# Patient Record
Sex: Female | Born: 1940 | Race: White | Hispanic: No | Marital: Married | Smoking: Never smoker
Health system: Southern US, Community
[De-identification: ages and names within clinical notes are randomized; demographics above are authoritative.]

## PROBLEM LIST (undated history)

## (undated) DIAGNOSIS — N183 Chronic kidney disease, stage 3 unspecified: Secondary | ICD-10-CM

## (undated) DIAGNOSIS — E785 Hyperlipidemia, unspecified: Secondary | ICD-10-CM

## (undated) DIAGNOSIS — I5042 Chronic combined systolic (congestive) and diastolic (congestive) heart failure: Secondary | ICD-10-CM

## (undated) DIAGNOSIS — I639 Cerebral infarction, unspecified: Secondary | ICD-10-CM

## (undated) DIAGNOSIS — E039 Hypothyroidism, unspecified: Secondary | ICD-10-CM

## (undated) DIAGNOSIS — I10 Essential (primary) hypertension: Secondary | ICD-10-CM

## (undated) HISTORY — PX: BACK SURGERY: SHX140

## (undated) HISTORY — PX: ABDOMINAL HYSTERECTOMY: SHX81

## (undated) HISTORY — DX: Chronic combined systolic (congestive) and diastolic (congestive) heart failure: I50.42

## (undated) HISTORY — DX: Chronic kidney disease, stage 3 unspecified: N18.30

## (undated) HISTORY — DX: Hypothyroidism, unspecified: E03.9

## (undated) HISTORY — DX: Cerebral infarction, unspecified: I63.9

---

## 2001-06-03 ENCOUNTER — Ambulatory Visit (HOSPITAL_BASED_OUTPATIENT_CLINIC_OR_DEPARTMENT_OTHER): Admission: RE | Admit: 2001-06-03 | Discharge: 2001-06-03 | Payer: Self-pay | Admitting: Internal Medicine

## 2022-03-02 ENCOUNTER — Inpatient Hospital Stay (HOSPITAL_COMMUNITY)
Admission: EM | Admit: 2022-03-02 | Discharge: 2022-03-07 | DRG: 065 | Disposition: A | Discharge status: Rehabilitation Facility | Payer: Medicare Other | Attending: Family Medicine | Admitting: Family Medicine

## 2022-03-02 ENCOUNTER — Other Ambulatory Visit: Payer: Self-pay

## 2022-03-02 ENCOUNTER — Emergency Department (HOSPITAL_COMMUNITY): Payer: Medicare Other

## 2022-03-02 DIAGNOSIS — I10 Essential (primary) hypertension: Secondary | ICD-10-CM | POA: Diagnosis present

## 2022-03-02 DIAGNOSIS — R339 Retention of urine, unspecified: Secondary | ICD-10-CM | POA: Diagnosis present

## 2022-03-02 DIAGNOSIS — R4701 Aphasia: Secondary | ICD-10-CM | POA: Diagnosis present

## 2022-03-02 DIAGNOSIS — I5022 Chronic systolic (congestive) heart failure: Secondary | ICD-10-CM | POA: Diagnosis present

## 2022-03-02 DIAGNOSIS — R682 Dry mouth, unspecified: Secondary | ICD-10-CM | POA: Diagnosis present

## 2022-03-02 DIAGNOSIS — I6340 Cerebral infarction due to embolism of unspecified cerebral artery: Secondary | ICD-10-CM | POA: Diagnosis not present

## 2022-03-02 DIAGNOSIS — Z20822 Contact with and (suspected) exposure to covid-19: Secondary | ICD-10-CM | POA: Diagnosis present

## 2022-03-02 DIAGNOSIS — T424X5A Adverse effect of benzodiazepines, initial encounter: Secondary | ICD-10-CM | POA: Diagnosis present

## 2022-03-02 DIAGNOSIS — R338 Other retention of urine: Secondary | ICD-10-CM | POA: Diagnosis present

## 2022-03-02 DIAGNOSIS — R471 Dysarthria and anarthria: Secondary | ICD-10-CM | POA: Diagnosis present

## 2022-03-02 DIAGNOSIS — R531 Weakness: Secondary | ICD-10-CM

## 2022-03-02 DIAGNOSIS — I5042 Chronic combined systolic (congestive) and diastolic (congestive) heart failure: Secondary | ICD-10-CM | POA: Diagnosis present

## 2022-03-02 DIAGNOSIS — Z79899 Other long term (current) drug therapy: Secondary | ICD-10-CM

## 2022-03-02 DIAGNOSIS — E785 Hyperlipidemia, unspecified: Secondary | ICD-10-CM | POA: Diagnosis present

## 2022-03-02 DIAGNOSIS — R413 Other amnesia: Secondary | ICD-10-CM | POA: Diagnosis present

## 2022-03-02 DIAGNOSIS — F411 Generalized anxiety disorder: Secondary | ICD-10-CM | POA: Diagnosis present

## 2022-03-02 DIAGNOSIS — I130 Hypertensive heart and chronic kidney disease with heart failure and stage 1 through stage 4 chronic kidney disease, or unspecified chronic kidney disease: Secondary | ICD-10-CM | POA: Diagnosis present

## 2022-03-02 DIAGNOSIS — N1832 Chronic kidney disease, stage 3b: Secondary | ICD-10-CM | POA: Diagnosis present

## 2022-03-02 DIAGNOSIS — R29810 Facial weakness: Secondary | ICD-10-CM | POA: Diagnosis present

## 2022-03-02 DIAGNOSIS — I4891 Unspecified atrial fibrillation: Secondary | ICD-10-CM | POA: Diagnosis present

## 2022-03-02 DIAGNOSIS — I69351 Hemiplegia and hemiparesis following cerebral infarction affecting right dominant side: Secondary | ICD-10-CM

## 2022-03-02 DIAGNOSIS — Z823 Family history of stroke: Secondary | ICD-10-CM

## 2022-03-02 DIAGNOSIS — E039 Hypothyroidism, unspecified: Secondary | ICD-10-CM | POA: Diagnosis present

## 2022-03-02 DIAGNOSIS — R29701 NIHSS score 1: Secondary | ICD-10-CM | POA: Diagnosis present

## 2022-03-02 DIAGNOSIS — F32A Depression, unspecified: Secondary | ICD-10-CM | POA: Diagnosis present

## 2022-03-02 DIAGNOSIS — I639 Cerebral infarction, unspecified: Secondary | ICD-10-CM | POA: Diagnosis present

## 2022-03-02 DIAGNOSIS — Z91041 Radiographic dye allergy status: Secondary | ICD-10-CM

## 2022-03-02 DIAGNOSIS — N1831 Chronic kidney disease, stage 3a: Secondary | ICD-10-CM | POA: Diagnosis present

## 2022-03-02 DIAGNOSIS — Z8249 Family history of ischemic heart disease and other diseases of the circulatory system: Secondary | ICD-10-CM

## 2022-03-02 HISTORY — DX: Essential (primary) hypertension: I10

## 2022-03-02 HISTORY — DX: Hyperlipidemia, unspecified: E78.5

## 2022-03-02 LAB — DIFFERENTIAL
Abs Immature Granulocytes: 0.01 10*3/uL (ref 0.00–0.07)
Basophils Absolute: 0.1 10*3/uL (ref 0.0–0.1)
Basophils Relative: 1 %
Eosinophils Absolute: 0.3 10*3/uL (ref 0.0–0.5)
Eosinophils Relative: 4 %
Immature Granulocytes: 0 %
Lymphocytes Relative: 31 %
Lymphs Abs: 2 10*3/uL (ref 0.7–4.0)
Monocytes Absolute: 0.7 10*3/uL (ref 0.1–1.0)
Monocytes Relative: 11 %
Neutro Abs: 3.4 10*3/uL (ref 1.7–7.7)
Neutrophils Relative %: 53 %

## 2022-03-02 LAB — COMPREHENSIVE METABOLIC PANEL
ALT: 17 U/L (ref 0–44)
AST: 30 U/L (ref 15–41)
Albumin: 4.4 g/dL (ref 3.5–5.0)
Alkaline Phosphatase: 74 U/L (ref 38–126)
Anion gap: 12 (ref 5–15)
BUN: 28 mg/dL — ABNORMAL HIGH (ref 8–23)
CO2: 26 mmol/L (ref 22–32)
Calcium: 9.7 mg/dL (ref 8.9–10.3)
Chloride: 98 mmol/L (ref 98–111)
Creatinine, Ser: 1.14 mg/dL — ABNORMAL HIGH (ref 0.44–1.00)
GFR, Estimated: 48 mL/min — ABNORMAL LOW (ref >60)
Glucose, Bld: 123 mg/dL — ABNORMAL HIGH (ref 70–99)
Potassium: 3.7 mmol/L (ref 3.5–5.1)
Sodium: 136 mmol/L (ref 135–145)
Total Bilirubin: 0.4 mg/dL (ref 0.3–1.2)
Total Protein: 7.2 g/dL (ref 6.5–8.1)

## 2022-03-02 LAB — PROTIME-INR
INR: 1.1 (ref 0.8–1.2)
Prothrombin Time: 13.6 seconds (ref 11.4–15.2)

## 2022-03-02 LAB — I-STAT CHEM 8, ED
BUN: 29 mg/dL — ABNORMAL HIGH (ref 8–23)
Calcium, Ion: 1.08 mmol/L — ABNORMAL LOW (ref 1.15–1.40)
Chloride: 98 mmol/L (ref 98–111)
Creatinine, Ser: 1.2 mg/dL — ABNORMAL HIGH (ref 0.44–1.00)
Glucose, Bld: 118 mg/dL — ABNORMAL HIGH (ref 70–99)
HCT: 37 % (ref 36.0–46.0)
Hemoglobin: 12.6 g/dL (ref 12.0–15.0)
Potassium: 3.7 mmol/L (ref 3.5–5.1)
Sodium: 135 mmol/L (ref 135–145)
TCO2: 26 mmol/L (ref 22–32)

## 2022-03-02 LAB — CBC
HCT: 35.8 % — ABNORMAL LOW (ref 36.0–46.0)
Hemoglobin: 12.2 g/dL (ref 12.0–15.0)
MCH: 30.7 pg (ref 26.0–34.0)
MCHC: 34.1 g/dL (ref 30.0–36.0)
MCV: 89.9 fL (ref 80.0–100.0)
Platelets: 290 10*3/uL (ref 150–400)
RBC: 3.98 MIL/uL (ref 3.87–5.11)
RDW: 12.8 % (ref 11.5–15.5)
WBC: 6.5 10*3/uL (ref 4.0–10.5)
nRBC: 0 % (ref 0.0–0.2)

## 2022-03-02 LAB — ETHANOL: Alcohol, Ethyl (B): <10 mg/dL (ref <10)

## 2022-03-02 LAB — APTT: aPTT: 30 seconds (ref 24–36)

## 2022-03-02 IMAGING — CT CT HEAD CODE STROKE
3 series · 15 of 47 positions shown, 18 images · non-contrast
Comparison: None Available.

CLINICAL DATA: Code stroke. Initial evaluation for neuro deficit,
stroke suspected.



[Series 3: head 5.0 h30s · axial · 0.42mm/px · z∈[-53,+82]mm · 9 of 33 slices shown, 12 images]
[im 3/33  brain]
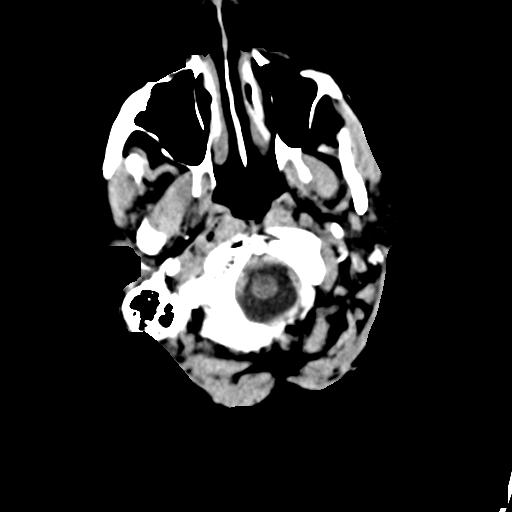
[im 3/33  bone]
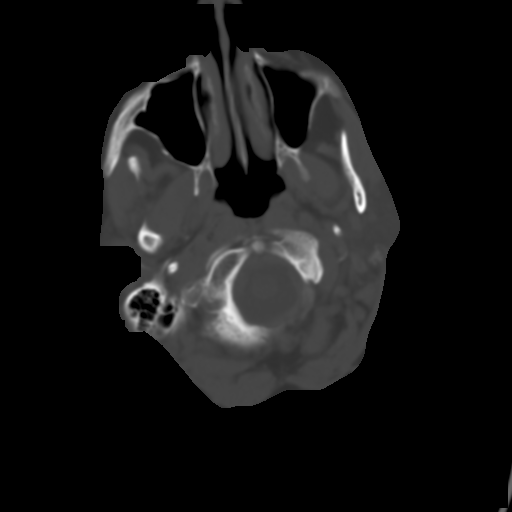
[im 6/33  brain]
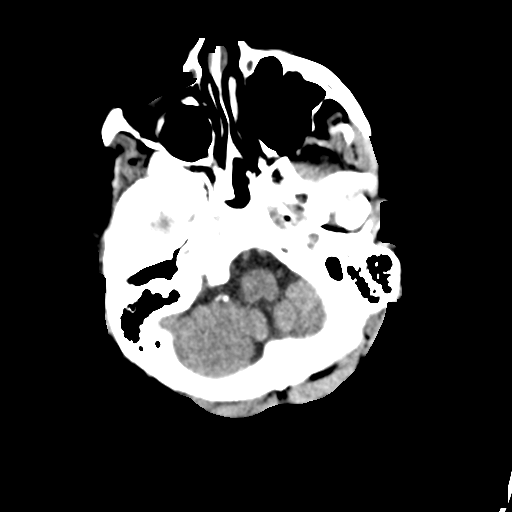
[im 9/33  brain]
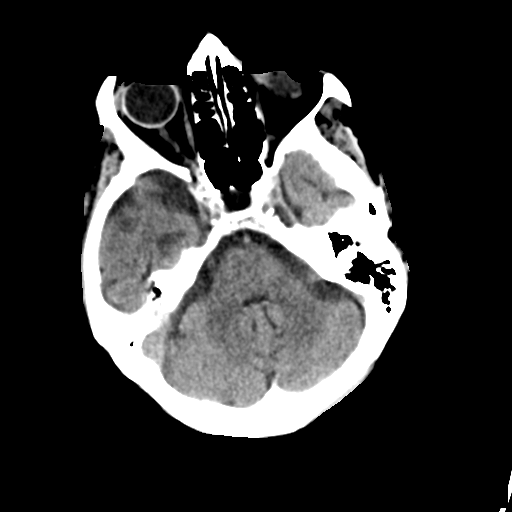
[im 13/33  brain]
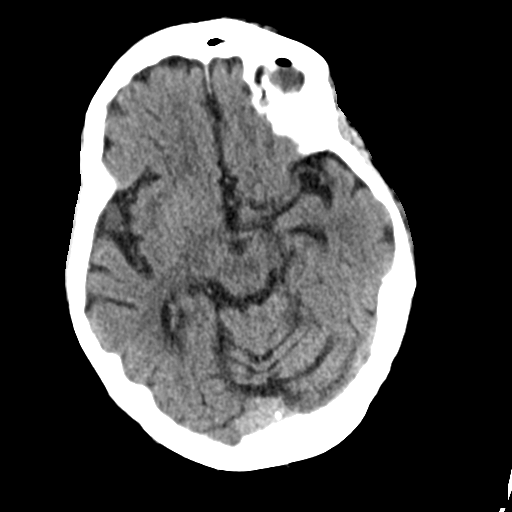
[im 17/33  brain]
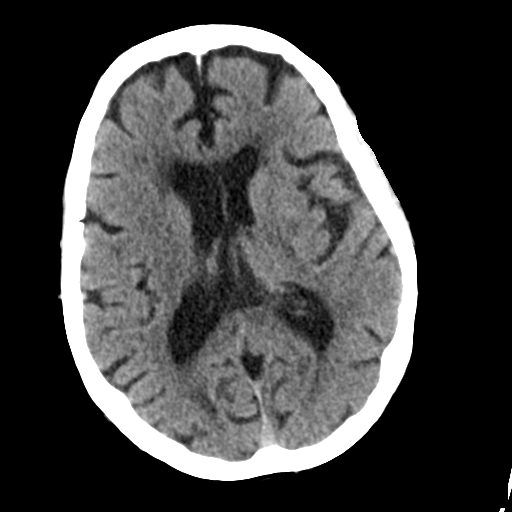
[im 17/33  bone]
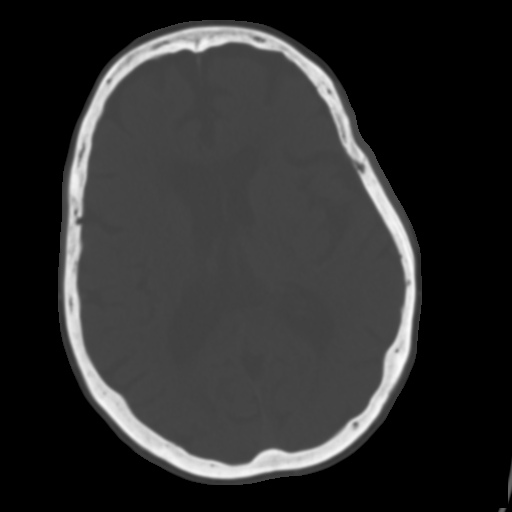
[im 20/33  brain]
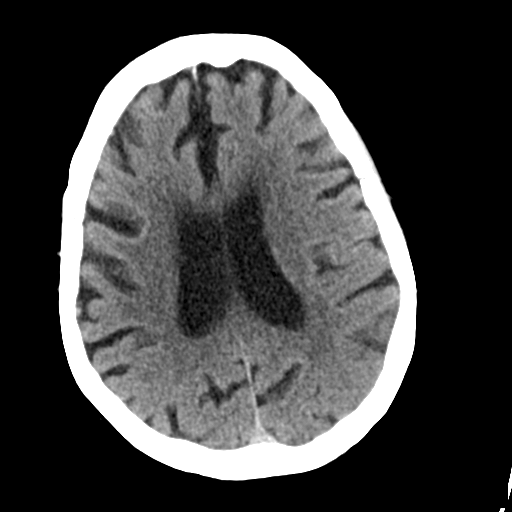
[im 24/33  brain]
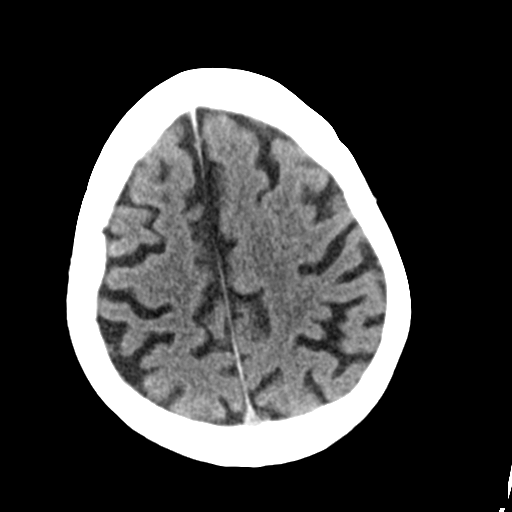
[im 27/33  brain]
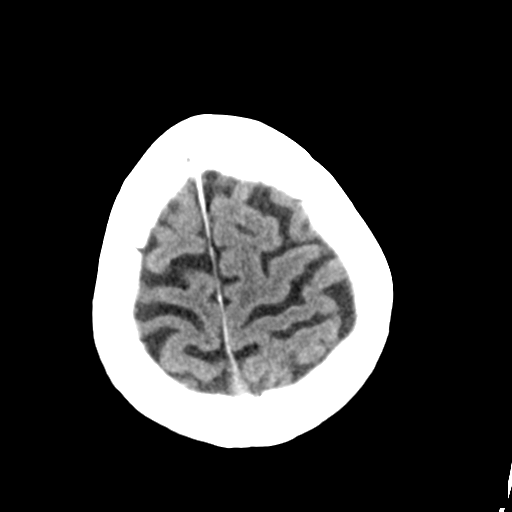
[im 30/33  brain]
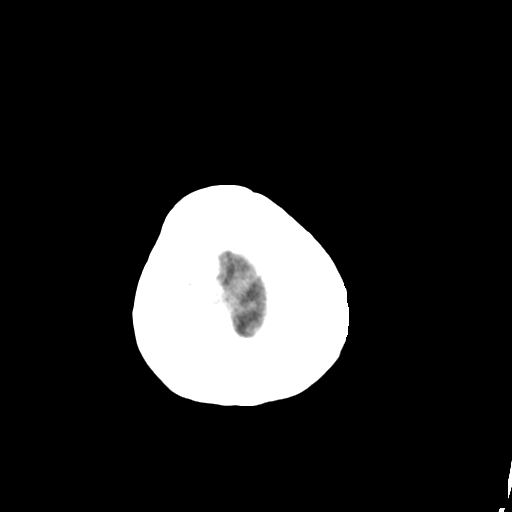
[im 30/33  bone]
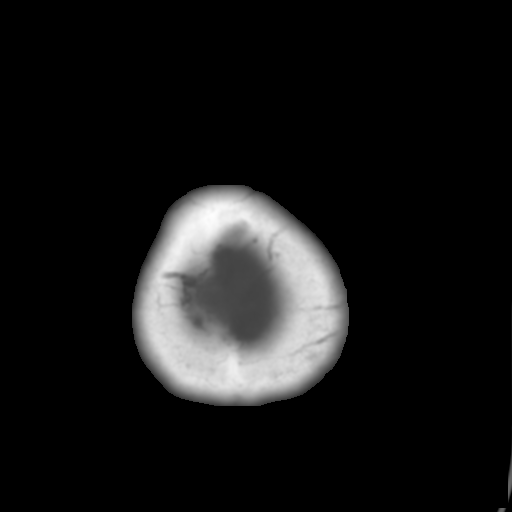

[Series 5: head 3.0 mpr cor · coronal · 0.32mm/px · 3 of 72 slices shown]
[im 24/72  brain]
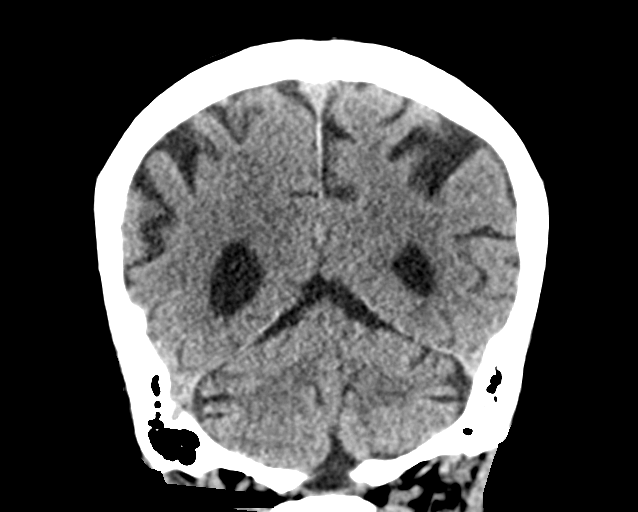
[im 32/72  brain]
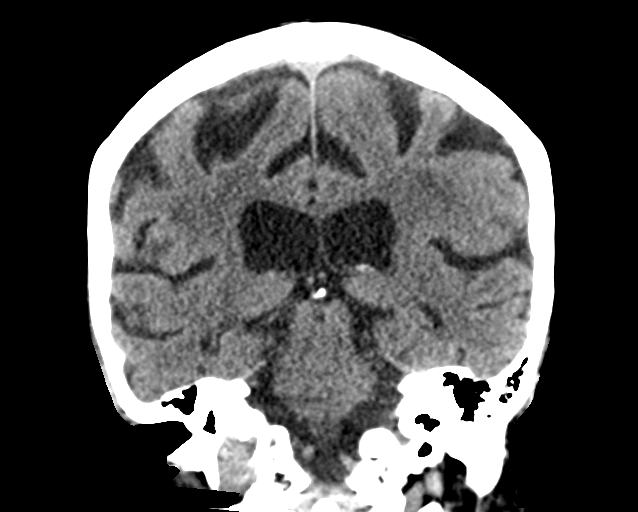
[im 40/72  brain]
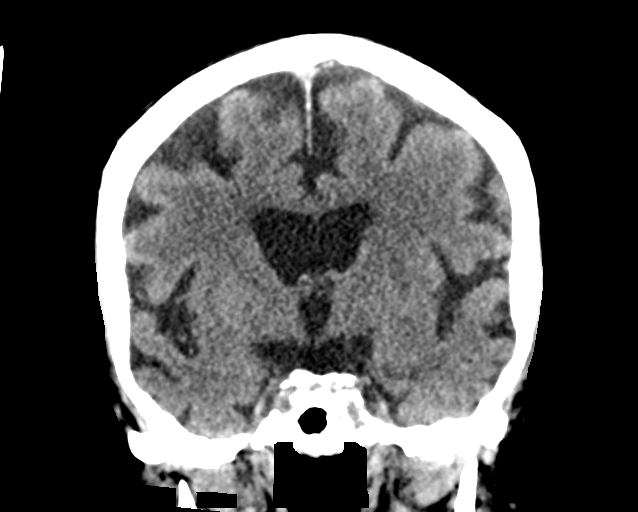

[Series 6: head 3.0 mpr sag · sagittal · 0.38mm/px · 3 of 55 slices shown]
[im 21/55  brain]
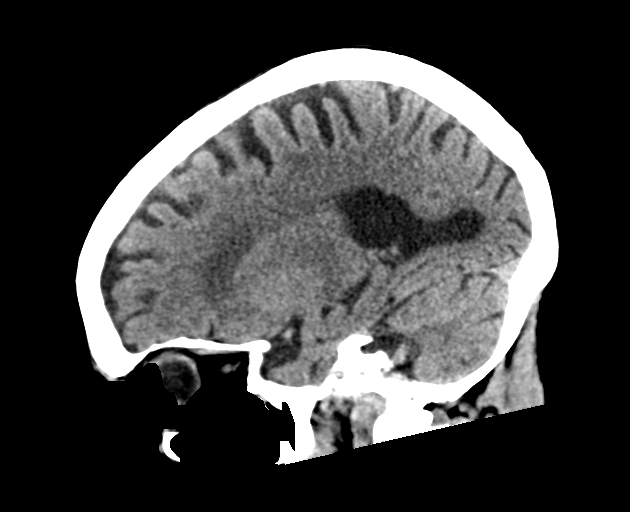
[im 27/55  brain]
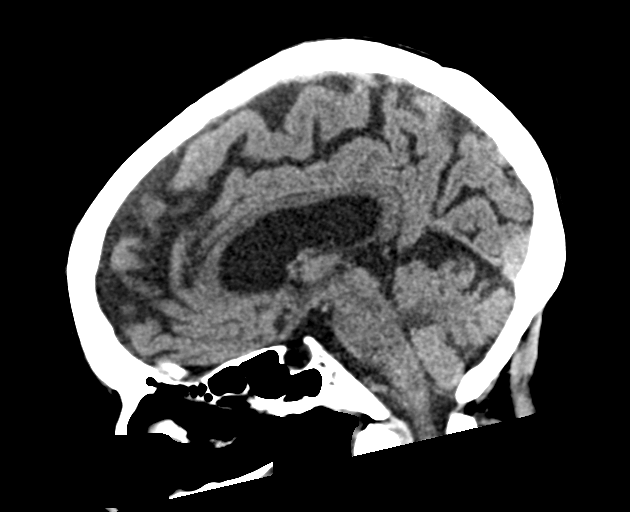
[im 33/55  brain]
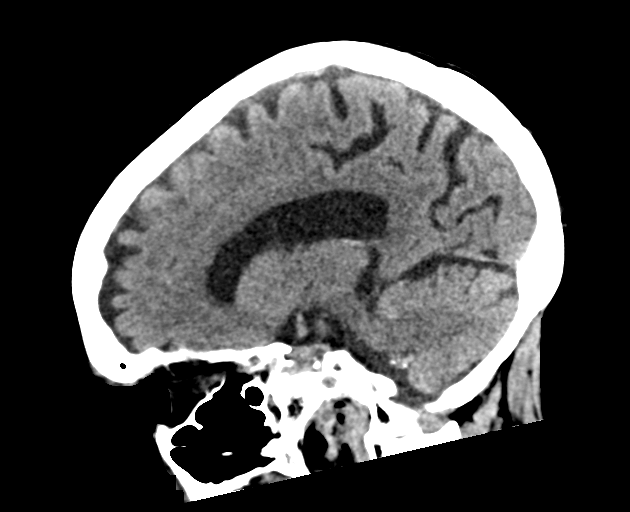

[15 of 47 positions shown; findings below may reference images not displayed]

FINDINGS: Brain: Age-related cerebral atrophy with chronic small vessel
ischemic disease. No acute intracranial hemorrhage. No acute large
vessel territory infarct. No mass lesion or midline shift. No
hydrocephalus or extra-axial fluid collection.

Vascular: No hyperdense vessel. Calcified atherosclerosis present at
skull base.

Skull: Scalp soft tissues and calvarium within normal limits.

Sinuses/Orbits: Globes and orbital soft tissues demonstrate no acute
finding. Scattered mucosal thickening noted within the ethmoidal air
cells. Paranasal sinuses are otherwise clear. No mastoid effusion.

Other: None.

ASPECTS ([place] Stroke Program Early CT Score)

- Ganglionic level infarction (caudate, lentiform nuclei, internal
capsule, insula, M1-M3 cortex): 7

- Supraganglionic infarction (M4-M6 cortex): 3

Total score (0-10 with 10 being normal): 10
IMPRESSION: 1. No acute intracranial abnormality.
2. ASPECTS is 10.
3. Age-related cerebral atrophy with mild chronic small vessel
ischemic disease.

These results were communicated to Dr. LEXY at [DATE] on
[DATE] by text page via the [employer] messaging system.

## 2022-03-02 MED ORDER — LORAZEPAM 2 MG/ML IJ SOLN
1.0000 mg | Freq: Once | INTRAMUSCULAR | Status: AC
  Administered 2022-03-03: 1 mg via INTRAVENOUS
  Filled 2022-03-02: qty 1

## 2022-03-02 NOTE — Consult Note (Incomplete)
Neurology ***Consult H&P  Tamara Bullock MR# [id number] 03/02/2022   CC: ***  History is obtained from: *** and chart.  HPI: Tamara Bullock is a 81 y.o. female PMHx as reviewed below ***   LKW: *** tNK given: No *** IR Thrombectomy No, *** {Modified Rankin Scale:21264:::1} NIHSS: ***  ROS: A complete ROS was performed and is negative except as noted in the HPI. *** Unable to assess due to encephalopathy.  No past medical history on file.   No family history on file.  Social History:  has no history on file for tobacco use, alcohol use, and drug use.   Prior to Admission medications   Not on File    Exam: Current vital signs: BP (!) 152/67   Pulse (!) 59   Temp 98.2 F (36.8 C) (Oral)   Resp 17   SpO2 95%   Physical Exam  Constitutional: Appears well-developed and well-nourished.  Psych: Affect appropriate to situation Eyes: No scleral injection HENT: No OP obstruction. Head: Normocephalic.  Cardiovascular: Normal rate and regular rhythm.  Respiratory: Effort normal, symmetric excursions bilaterally, no audible wheezing. GI: Soft.  No distension. There is no tenderness.  Skin: WDI  Neuro: Mental Status: Patient is awake, alert, oriented to person, place, month, year, and situation.*** Patient is able to give a clear and coherent history.*** Speech *** fluent, intact comprehension and repetition. No signs of aphasia or neglect.*** Visual Fields are full. Pupils are equal, round, and reactive to light.*** EOMI without ptosis or diplopia.  Facial sensation is symmetric to temperature Facial movement is symmetric.  Hearing is intact to voice. Uvula midline and palate elevates symmetrically. Shoulder shrug is symmetric. Tongue is midline without atrophy or fasciculations.  Tone is normal. Bulk is normal. 5/5 strength was present in all four extremities.*** Sensation is symmetric to light touch and temperature in the arms and legs.*** Deep Tendon  Reflexes: 2+ and symmetric in the biceps and patellae.*** Toes are downgoing bilaterally.*** FNF and HKS are intact bilaterally.*** Gait - Deferred***  I have reviewed labs in epic and the pertinent results are: ***  I have reviewed the images obtained: NCT head showed *** CTA head and neck showed ***  Assessment: Tamara Bullock is a 81 y.o. female PMHx ***   ***Recommended aspirin 324mg  now.  Impression:  ***  Plan: - MRI brain without contrast. - Recommend vascular imaging with MRA head and neck. - Recommend TTE. - Recommend labs: HbA1c, lipid panel. - Recommend Statin for goal LDL <70. - Goal A1c <7. - Aspirin 81mg  daily. - Clopidogrel 75mg  daily for 3 weeks. - SBP goal <***. - Permissive hypertension first 24 h < 220/110.  - Telemetry monitoring for arrhythmia. - Recommend bedside Swallow screen. - Recommend Stroke education. - Recommend PT/OT/SLP consult. - Routine EEG. - Recommend metabolic/infectious workup with CBC, CMP, UA with UCx, CXR, CK, serum lactate.   This patient is critically ill and at significant risk of neurological worsening, death and care requires constant monitoring of vital signs, hemodynamics,respiratory and cardiac monitoring, neurological assessment, discussion with family, other specialists and medical decision making of high complexity. I spent *** minutes of neurocritical care time  in the care of  this patient. This was time spent independent of any time provided by nurse practitioner or PA.  Electronically signed by:  Lynnae Sandhoff, MD Page: [id number] 03/02/2022, 11:15 PM  If 7pm- 7am, please page neurology on call as listed in [place].

## 2022-03-02 NOTE — ED Provider Notes (Signed)
Patient care assumed at 2330 aphasia CTA/MRI

## 2022-03-02 NOTE — ED Triage Notes (Signed)
Patient arrived via EMS with complaints of right sided weakness, slurred speech, and facial drooped, last known well 1100 today per husband. EMS reports CBG-129, BP-220/palpated, HR-77. Code stroke was not called at bedside.

## 2022-03-02 NOTE — ED Provider Notes (Signed)
[facility] EMERGENCY DEPARTMENT Provider Note   CSN: [id number] Arrival date & time: 03/02/22  2125     History  Chief Complaint  Patient presents with   Code Stroke    Tamara Bullock is a 81 y.o. female.  The history is provided by the patient, the EMS personnel and a relative. The history is limited by the condition of the patient. No language interpreter was used.  Neurologic Problem This is a new problem. The current episode started 6 to 12 hours ago. The problem occurs constantly. The problem has not changed since onset.Pertinent negatives include no chest pain, no abdominal pain, no headaches and no shortness of breath. Nothing aggravates the symptoms. Nothing relieves the symptoms. She has tried nothing for the symptoms. The treatment provided no relief.      Home Medications Prior to Admission medications   Not on File      Allergies    Patient has no allergy information on record.    Review of Systems   Review of Systems  Unable to perform ROS: Other (aphasia)  Constitutional:  Negative for chills.  HENT:  Negative for congestion.   Eyes:  Negative for photophobia.  Respiratory:  Negative for cough, chest tightness and shortness of breath.   Cardiovascular:  Negative for chest pain.  Gastrointestinal:  Negative for abdominal pain, constipation, diarrhea, nausea and vomiting.  Genitourinary:  Negative for dysuria.  Musculoskeletal:  Negative for back pain, neck pain and neck stiffness.  Neurological:  Positive for facial asymmetry, speech difficulty and weakness. Negative for dizziness, light-headedness and headaches.  Psychiatric/Behavioral:  Negative for agitation.    Physical Exam Updated Vital Signs BP (!) 167/72 (BP Location: Right Arm)   Pulse 60   Temp 98.2 F (36.8 C) (Oral)   Resp 20   SpO2 99%  Physical Exam Vitals and nursing note reviewed.  Constitutional:      General: She is not in acute distress.    Appearance: She is  well-developed. She is not ill-appearing, toxic-appearing or diaphoretic.  HENT:     Head: Normocephalic and atraumatic.     Nose: No congestion or rhinorrhea.     Mouth/Throat:     Mouth: Mucous membranes are moist.     Pharynx: No oropharyngeal exudate or posterior oropharyngeal erythema.  Eyes:     Extraocular Movements: Extraocular movements intact.     Conjunctiva/sclera: Conjunctivae normal.     Pupils: Pupils are equal, round, and reactive to light.  Neck:     Vascular: No carotid bruit.  Cardiovascular:     Rate and Rhythm: Normal rate and regular rhythm.     Heart sounds: No murmur heard. Pulmonary:     Effort: Pulmonary effort is normal. No respiratory distress.     Breath sounds: Normal breath sounds.  Abdominal:     General: Abdomen is flat.     Palpations: Abdomen is soft.     Tenderness: There is no abdominal tenderness. There is no guarding or rebound.  Musculoskeletal:        General: No swelling or tenderness.     Cervical back: Neck supple. No tenderness.  Skin:    General: Skin is warm and dry.     Capillary Refill: Capillary refill takes less than 2 seconds.     Findings: No erythema.  Neurological:     Mental Status: She is alert.     Cranial Nerves: Cranial nerve deficit (fdacial droop) present.     Sensory:  No sensory deficit.     Motor: Weakness present.    ED Results / Procedures / Treatments   Labs (all labs ordered are listed, but only abnormal results are displayed) Labs Reviewed  CBC - Abnormal; Notable for the following components:      Result Value   HCT 35.8 (*)    All other components within normal limits  COMPREHENSIVE METABOLIC PANEL - Abnormal; Notable for the following components:   Glucose, Bld 123 (*)    BUN 28 (*)    Creatinine, Ser 1.14 (*)    GFR, Estimated 48 (*)    All other components within normal limits  I-STAT CHEM 8, ED - Abnormal; Notable for the following components:   BUN 29 (*)    Creatinine, Ser 1.20 (*)     Glucose, Bld 118 (*)    Calcium, Ion 1.08 (*)    All other components within normal limits  RESP PANEL BY RT-PCR (FLU A&B, COVID) ARPGX2  ETHANOL  PROTIME-INR  APTT  DIFFERENTIAL  RAPID URINE DRUG SCREEN, HOSP PERFORMED  URINALYSIS, ROUTINE W REFLEX MICROSCOPIC    EKG EKG Interpretation  Date/Time:  Friday March 02 2022 21:46:36 EDT Ventricular Rate:  66 PR Interval:  159 QRS Duration: 99 QT Interval:  457 QTC Calculation: 479 R Axis:   -25 Text Interpretation: Sinus rhythm Probable left ventricular hypertrophy Anterior Q waves, possibly due to LVH no prior ECG for comparison. NO STEMI Confirmed by Antony Blackbird [phone]) on 03/02/2022 9:58:40 PM  Radiology CT HEAD CODE STROKE WO CONTRAST  Result Date: 03/02/2022 CLINICAL DATA:  Code stroke. Initial evaluation for neuro deficit, stroke suspected. EXAM: CT HEAD WITHOUT CONTRAST TECHNIQUE: Contiguous axial images were obtained from the base of the skull through the vertex without intravenous contrast. RADIATION DOSE REDUCTION: This exam was performed according to the departmental dose-optimization program which includes automated exposure control, adjustment of the mA and/or kV according to patient size and/or use of iterative reconstruction technique. COMPARISON:  None Available. FINDINGS: Brain: Age-related cerebral atrophy with chronic small vessel ischemic disease. No acute intracranial hemorrhage. No acute large vessel territory infarct. No mass lesion or midline shift. No hydrocephalus or extra-axial fluid collection. Vascular: No hyperdense vessel. Calcified atherosclerosis present at skull base. Skull: Scalp soft tissues and calvarium within normal limits. Sinuses/Orbits: Globes and orbital soft tissues demonstrate no acute finding. Scattered mucosal thickening noted within the ethmoidal air cells. Paranasal sinuses are otherwise clear. No mastoid effusion. Other: None. ASPECTS [facility] Stroke Program Early CT Score) - Ganglionic level  infarction (caudate, lentiform nuclei, internal capsule, insula, M1-M3 cortex): 7 - Supraganglionic infarction (M4-M6 cortex): 3 Total score (0-10 with 10 being normal): 10 IMPRESSION: 1. No acute intracranial abnormality. 2. ASPECTS is 10. 3. Age-related cerebral atrophy with mild chronic small vessel ischemic disease. These results were communicated to Dr. Theda Sers at 9:51 pm on 03/02/2022 by text page via the [facility] messaging system. Electronically Signed   By: Jeannine Boga M.D.   On: 03/02/2022 21:51    Procedures Procedures    Medications Ordered in ED Medications - No data to display  ED Course/ Medical Decision Making/ A&P                           Medical Decision Making Problems Addressed: Acute right-sided weakness: acute illness or injury Aphasia: acute illness or injury  Amount and/or Complexity of Data Reviewed Independent Historian: EMS Labs: ordered. Radiology: ordered. ECG/medicine tests: ordered.  ANNEKA MULLALY is a 81 y.o. female with no known past medical history who presents for neurologic deficits.  According to EMS, patient did not have a good last normal but was found to have right-sided facial droop, right arm weakness, and right leg weakness as well as some aphasia.  Initially on scene they did not get a last normal however shortly after arrival, I called the husband who reports that at 11 AM, she was speaking clearly and did not have unilateral weakness.  He reports that "she was feeling bad" but cannot define what that meant earlier than that but did not have any focal neurologic deficits.  Thus, will activate a code stroke with a last normal at 11 AM with unilateral weakness and speech abnormality making her concerning for LVO.  Of note, EMS reports her blood pressure was approximately 220 upon arrival and her heart rate was between the 40s and 70s.  Patient is having a difficult time speaking with aphasia.  She is denying headache or neck pain at this  time.  She is denying chest pain or abdominal pain.  Denies back pain.  On my initial exam, she does indeed have a subtle right facial droop, grip strength decreased on the right, and decree strength on the right.  She has intact sensation throughout.  Pupils are symmetric and reactive with normal extraocular movements.  Bowel sounds were appreciated.  Lungs were clear.  Patient quickly taken to CT scanner for imaging.  Anticipate follow-up on neurology recommendations for further management of possible stroke.   Care transferred to oncoming team to await neurology recommendations and disposition.  Given her persistent neurologic deficits, I do anticipate she will likely need admission.  11:07 PM Just poke to neurology who recommends CTA head and neck and MRI brain without.  Plan of care be to reassess after imaging to determine disposition.         Final Clinical Impression(s) / ED Diagnoses Final diagnoses:  Aphasia  Acute right-sided weakness     Clinical Impression: 1. Aphasia   2. Acute right-sided weakness     Disposition: Care transferred to oncoming team to await neurology recommendations and disposition.  Given her persistent neurologic deficits, I do anticipate she will likely need admission.  This note was prepared with assistance of Systems analyst. Occasional wrong-word or sound-a-like substitutions may have occurred due to the inherent limitations of voice recognition software.     Brantlee Hinde, Gwenyth Allegra, MD 03/02/22 2308

## 2022-03-02 NOTE — Consult Note (Signed)
Neurology Consult H&P  Tamara HeckDorothy M Shuster MR# [id number] 03/02/2022   CC: right sided weakness  History is obtained from: daughter and ED staff and chart.  HPI: Tamara Bullock is a 81 y.o. female PMHx as reviewed below some time this afternoon the patient developed right sided facial droop, weakness and slurred speech. Last known well 1100 today per husband.   EMS reported CBG-129, BP-220/palpated, HR-77.  Code stroke was not called at bedside.   Patient complains of dry mouth.  LKW: 1100 tNK given: No OSW IR Thrombectomy No, not indicated Modified Rankin Scale: 0-Completely asymptomatic and back to baseline post- stroke NIHSS: 1 mild right lower face at rest.  ROS: A complete ROS was performed and is negative except as noted in the HPI.    No past medical history on file.   No family history on file.  Social History:  has no history on file for tobacco use, alcohol use, and drug use.   Prior to Admission medications   Not on File    Exam: Current vital signs: BP (!) 152/67   Pulse (!) 59   Temp 98.2 F (36.8 C) (Oral)   Resp 17   SpO2 95%   Physical Exam  Constitutional: Appears well-developed and well-nourished.  Psych: Affect appropriate to situation Eyes: No scleral injection HENT: No OP obstruction. Head: Normocephalic.  Cardiovascular: Normal rate and regular rhythm.  Respiratory: Effort normal, symmetric excursions bilaterally, no audible wheezing. GI: Soft.  No distension. There is no tenderness.  Skin: WDI  Neuro: Mental Status: Patient is awake, alert, oriented to person, place, month, year, and situation. Patient is able to give a clear and coherent history. Speech mild dysarthric, mild impaired fluency, intact comprehension and repetition. No signs of aphasia or neglect. Visual Fields are full. Pupils are equal, round, and reactive to light. EOMI without ptosis or diplopia.  Facial sensation is symmetric to temperature Facial movement is  symmetric.  Hearing is intact to voice. Uvula midline and palate elevates symmetrically. Shoulder shrug is symmetric. Tongue is midline without atrophy or fasciculations.  Tone is normal. Bulk is normal. 5/5 strength was present in all four extremities. Sensation is symmetric to light touch and temperature in the arms and legs. Deep Tendon Reflexes: 2+ and symmetric in the biceps and patellae. Toes are downgoing bilaterally. FNF and HKS are intact bilaterally. Gait - Deferred  I have reviewed labs in epic and the pertinent results are: Serum glucose 123  I have reviewed the images obtained: NCT head showed No acute intracranial abnormality. ASPECTS is 10. MRI brain showed acute ischemic stroke left basal ganglia. MRA HEAD: 1. Negative intracranial MRA for large vessel occlusion. No hemodynamically significant or correctable stenosis. 2. Mild distal small vessel atheromatous irregularity.   MRA NECK: 1. Technically limited exam due to timing of the contrast bolus, with no contrast seen within the arterial system of the neck. 2. Mild atherosclerotic change about the carotid bifurcations/proximal ICAs bilaterally without hemodynamically significant greater than 50% stenosis. 3. Patent vertebral arteries within the neck without significant stenosis or other acute finding.  Assessment: Tamara Bullock is a 81 y.o. female PMHx as noted above reportedly with acute right sided weakness. On exam, there was not right sided weakness however, very mild right lower face droop which was symmetric with smiling. She sates that her difficulty speaking may be due to dry mouth. However MRI showed acute left basal ganglia stroke and she will need admission for stroke workup.   Impression:  Acute embolic stroke left basal ganglia Mild dysarthria, mild aphasia NIHSS 1  Plan: - Recommend TTE. - Recommend labs: HbA1c, lipid panel. - Recommend Statin for goal LDL <70. - Goal A1c <7. - Aspirin  81mg  daily. - Clopidogrel 75mg  daily for 3 weeks. - SBP goal <180. - Telemetry monitoring for arrhythmia. - Recommend bedside Swallow screen. - Recommend Stroke education. - Recommend PT/OT/SLP consult.   This patient is critically ill and at significant risk of neurological worsening, death and care requires constant monitoring of vital signs, hemodynamics,respiratory and cardiac monitoring, neurological assessment, discussion with family, other specialists and medical decision making of high complexity. I spent 74 minutes of neurocritical care time  in the care of  this patient. This was time spent independent of any time provided by nurse practitioner or PA.  Electronically signed by:  Lynnae Sandhoff, MD Page: [phone] 03/02/2022, 11:15 PM  If 7pm- 7am, please page neurology on call as listed in [place].

## 2022-03-03 ENCOUNTER — Emergency Department (HOSPITAL_COMMUNITY): Payer: Medicare Other

## 2022-03-03 ENCOUNTER — Inpatient Hospital Stay (HOSPITAL_COMMUNITY): Payer: Medicare Other

## 2022-03-03 ENCOUNTER — Other Ambulatory Visit (HOSPITAL_COMMUNITY): Payer: Medicare Other

## 2022-03-03 ENCOUNTER — Encounter (HOSPITAL_COMMUNITY): Payer: Self-pay | Admitting: Internal Medicine

## 2022-03-03 DIAGNOSIS — R471 Dysarthria and anarthria: Secondary | ICD-10-CM | POA: Diagnosis present

## 2022-03-03 DIAGNOSIS — R29701 NIHSS score 1: Secondary | ICD-10-CM | POA: Diagnosis present

## 2022-03-03 DIAGNOSIS — R29810 Facial weakness: Secondary | ICD-10-CM | POA: Diagnosis present

## 2022-03-03 DIAGNOSIS — I69351 Hemiplegia and hemiparesis following cerebral infarction affecting right dominant side: Secondary | ICD-10-CM | POA: Diagnosis not present

## 2022-03-03 DIAGNOSIS — F32A Depression, unspecified: Secondary | ICD-10-CM | POA: Diagnosis present

## 2022-03-03 DIAGNOSIS — M545 Low back pain, unspecified: Secondary | ICD-10-CM | POA: Diagnosis not present

## 2022-03-03 DIAGNOSIS — Z8249 Family history of ischemic heart disease and other diseases of the circulatory system: Secondary | ICD-10-CM | POA: Diagnosis not present

## 2022-03-03 DIAGNOSIS — R338 Other retention of urine: Secondary | ICD-10-CM | POA: Diagnosis present

## 2022-03-03 DIAGNOSIS — N1831 Chronic kidney disease, stage 3a: Secondary | ICD-10-CM | POA: Diagnosis present

## 2022-03-03 DIAGNOSIS — G8929 Other chronic pain: Secondary | ICD-10-CM | POA: Diagnosis not present

## 2022-03-03 DIAGNOSIS — I6340 Cerebral infarction due to embolism of unspecified cerebral artery: Secondary | ICD-10-CM | POA: Diagnosis present

## 2022-03-03 DIAGNOSIS — I6389 Other cerebral infarction: Secondary | ICD-10-CM | POA: Diagnosis not present

## 2022-03-03 DIAGNOSIS — E039 Hypothyroidism, unspecified: Secondary | ICD-10-CM | POA: Diagnosis present

## 2022-03-03 DIAGNOSIS — R4701 Aphasia: Secondary | ICD-10-CM

## 2022-03-03 DIAGNOSIS — I639 Cerebral infarction, unspecified: Secondary | ICD-10-CM

## 2022-03-03 DIAGNOSIS — I6381 Other cerebral infarction due to occlusion or stenosis of small artery: Secondary | ICD-10-CM | POA: Diagnosis not present

## 2022-03-03 DIAGNOSIS — I130 Hypertensive heart and chronic kidney disease with heart failure and stage 1 through stage 4 chronic kidney disease, or unspecified chronic kidney disease: Secondary | ICD-10-CM | POA: Diagnosis present

## 2022-03-03 DIAGNOSIS — R413 Other amnesia: Secondary | ICD-10-CM | POA: Diagnosis present

## 2022-03-03 DIAGNOSIS — R4782 Fluency disorder in conditions classified elsewhere: Secondary | ICD-10-CM | POA: Diagnosis not present

## 2022-03-03 DIAGNOSIS — Z91041 Radiographic dye allergy status: Secondary | ICD-10-CM | POA: Diagnosis not present

## 2022-03-03 DIAGNOSIS — R682 Dry mouth, unspecified: Secondary | ICD-10-CM | POA: Diagnosis present

## 2022-03-03 DIAGNOSIS — T424X5A Adverse effect of benzodiazepines, initial encounter: Secondary | ICD-10-CM | POA: Diagnosis present

## 2022-03-03 DIAGNOSIS — I4891 Unspecified atrial fibrillation: Secondary | ICD-10-CM | POA: Diagnosis present

## 2022-03-03 DIAGNOSIS — R339 Retention of urine, unspecified: Secondary | ICD-10-CM | POA: Diagnosis present

## 2022-03-03 DIAGNOSIS — F411 Generalized anxiety disorder: Secondary | ICD-10-CM | POA: Diagnosis present

## 2022-03-03 DIAGNOSIS — R531 Weakness: Secondary | ICD-10-CM | POA: Diagnosis not present

## 2022-03-03 DIAGNOSIS — I5022 Chronic systolic (congestive) heart failure: Secondary | ICD-10-CM | POA: Diagnosis present

## 2022-03-03 DIAGNOSIS — I10 Essential (primary) hypertension: Secondary | ICD-10-CM | POA: Diagnosis present

## 2022-03-03 DIAGNOSIS — Z79899 Other long term (current) drug therapy: Secondary | ICD-10-CM | POA: Diagnosis not present

## 2022-03-03 DIAGNOSIS — Z20822 Contact with and (suspected) exposure to covid-19: Secondary | ICD-10-CM | POA: Diagnosis present

## 2022-03-03 DIAGNOSIS — Z823 Family history of stroke: Secondary | ICD-10-CM | POA: Diagnosis not present

## 2022-03-03 DIAGNOSIS — E785 Hyperlipidemia, unspecified: Secondary | ICD-10-CM | POA: Diagnosis present

## 2022-03-03 DIAGNOSIS — N1832 Chronic kidney disease, stage 3b: Secondary | ICD-10-CM | POA: Diagnosis present

## 2022-03-03 LAB — ECHOCARDIOGRAM COMPLETE
Area-P 1/2: 1.94 cm2
S' Lateral: 2.75 cm
Single Plane A4C EF: 29.6 %

## 2022-03-03 LAB — BASIC METABOLIC PANEL
Anion gap: 10 (ref 5–15)
BUN: 26 mg/dL — ABNORMAL HIGH (ref 8–23)
CO2: 25 mmol/L (ref 22–32)
Calcium: 9.6 mg/dL (ref 8.9–10.3)
Chloride: 102 mmol/L (ref 98–111)
Creatinine, Ser: 1.05 mg/dL — ABNORMAL HIGH (ref 0.44–1.00)
GFR, Estimated: 53 mL/min — ABNORMAL LOW (ref >60)
Glucose, Bld: 114 mg/dL — ABNORMAL HIGH (ref 70–99)
Potassium: 3.6 mmol/L (ref 3.5–5.1)
Sodium: 137 mmol/L (ref 135–145)

## 2022-03-03 LAB — RAPID URINE DRUG SCREEN, HOSP PERFORMED
Amphetamines: NOT DETECTED
Barbiturates: NOT DETECTED
Benzodiazepines: NOT DETECTED
Cocaine: NOT DETECTED
Opiates: NOT DETECTED
Tetrahydrocannabinol: NOT DETECTED

## 2022-03-03 LAB — LIPID PANEL
Cholesterol: 278 mg/dL — ABNORMAL HIGH (ref 0–200)
HDL: 58 mg/dL (ref >40)
LDL Cholesterol: 198 mg/dL — ABNORMAL HIGH (ref 0–99)
Total CHOL/HDL Ratio: 4.8 RATIO
Triglycerides: 112 mg/dL (ref <150)
VLDL: 22 mg/dL (ref 0–40)

## 2022-03-03 LAB — URINALYSIS, ROUTINE W REFLEX MICROSCOPIC
Bacteria, UA: NONE SEEN
Bilirubin Urine: NEGATIVE
Glucose, UA: NEGATIVE mg/dL
Hgb urine dipstick: NEGATIVE
Ketones, ur: NEGATIVE mg/dL
Nitrite: NEGATIVE
Protein, ur: NEGATIVE mg/dL
Specific Gravity, Urine: 1.009 (ref 1.005–1.030)
pH: 6 (ref 5.0–8.0)

## 2022-03-03 LAB — CBC
HCT: 35.6 % — ABNORMAL LOW (ref 36.0–46.0)
Hemoglobin: 12.4 g/dL (ref 12.0–15.0)
MCH: 30.8 pg (ref 26.0–34.0)
MCHC: 34.8 g/dL (ref 30.0–36.0)
MCV: 88.6 fL (ref 80.0–100.0)
Platelets: 278 10*3/uL (ref 150–400)
RBC: 4.02 MIL/uL (ref 3.87–5.11)
RDW: 12.9 % (ref 11.5–15.5)
WBC: 9.4 10*3/uL (ref 4.0–10.5)
nRBC: 0 % (ref 0.0–0.2)

## 2022-03-03 LAB — RESP PANEL BY RT-PCR (FLU A&B, COVID) ARPGX2
Influenza A by PCR: NEGATIVE
Influenza B by PCR: NEGATIVE
SARS Coronavirus 2 by RT PCR: NEGATIVE

## 2022-03-03 LAB — HEMOGLOBIN A1C
Hgb A1c MFr Bld: 6 % — ABNORMAL HIGH (ref 4.8–5.6)
Mean Plasma Glucose: 125.5 mg/dL

## 2022-03-03 LAB — PHOSPHORUS: Phosphorus: 3.4 mg/dL (ref 2.5–4.6)

## 2022-03-03 LAB — MAGNESIUM: Magnesium: 2.3 mg/dL (ref 1.7–2.4)

## 2022-03-03 IMAGING — MR MR HEAD W/O CM
10 of 12 series · 33 of 48 positions shown · IV contrast (agent unspecified)
Comparison: Prior head CT from [DATE].

CLINICAL DATA: Initial evaluation for neuro deficit, stroke
suspected.

EXAM:
MRI HEAD WITHOUT CONTRAST
MRA HEAD WITHOUT CONTRAST
MRA NECK WITHOUT AND WITH CONTRAST
TECHNIQUE: Multiplanar, multi-echo pulse sequences of the brain and surrounding
structures were acquired without intravenous contrast. Angiographic
images of the Circle of Willis were acquired using MRA technique
without intravenous contrast. Angiographic images of the neck were
acquired using MRA technique without and with intravenous contrast.
Carotid stenosis measurements (when applicable) are obtained
utilizing NASCET criteria, using the distal internal carotid
diameter as the denominator.
CONTRAST:  Please see contrast documentation.

[Series 5: DWI · axial · 3.0mm · 0.88mm/px · z∈[-114,+31]mm · 7 of 100 slices shown (1 of 4)]
[im 1/100]
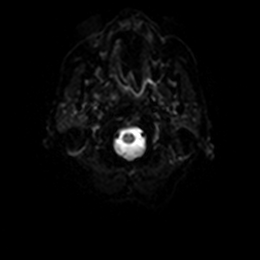
[im 17/100]
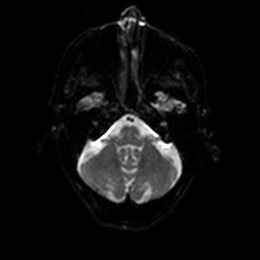
[im 34/100]
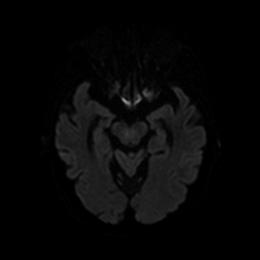
[im 50/100]
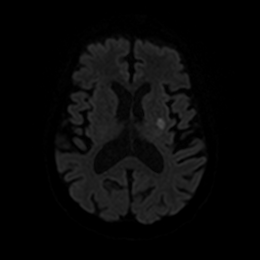
[im 67/100]
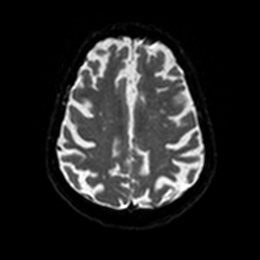
[im 83/100]
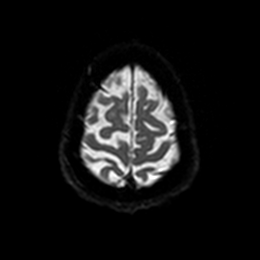
[im 100/100]
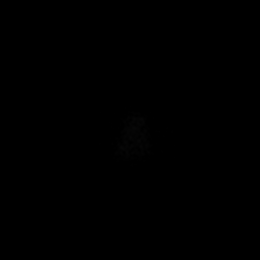

[Series 6: DWI · axial · 3.0mm · 0.88mm/px · z∈[-114,+31]mm · 4 of 50 slices shown (2 of 4)]
[im 1/50]
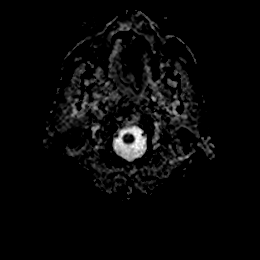
[im 17/50]
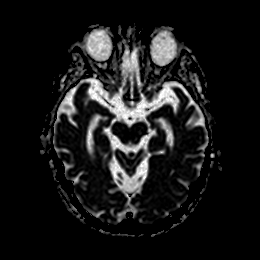
[im 33/50]
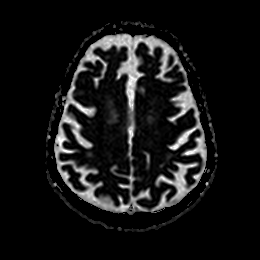
[im 50/50]
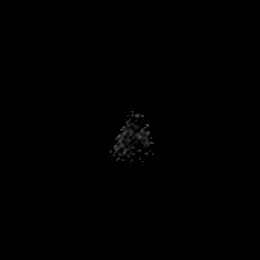

[Series 7: DWI · coronal · 4.0mm · 0.88mm/px · 4 of 64 slices shown (3 of 4)]
[im 1/64]
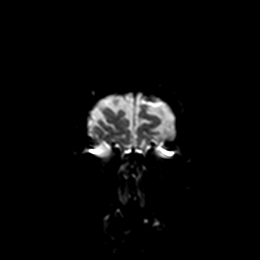
[im 22/64]
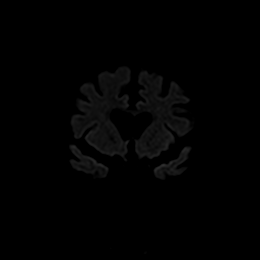
[im 43/64]
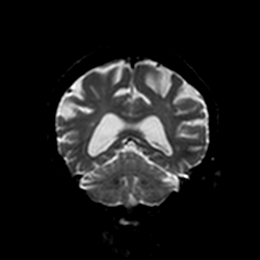
[im 64/64]
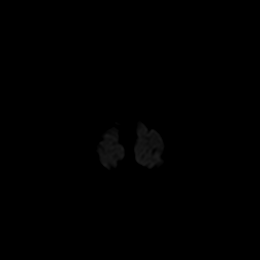

[Series 8: DWI · coronal · 4.0mm · 0.88mm/px · 2 of 32 slices shown (4 of 4)]
[im 1/32]
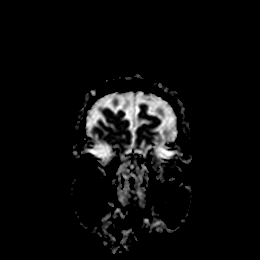
[im 32/32]
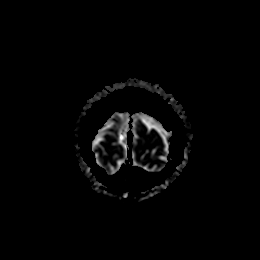

[Series 9: T1 · sagittal · 5.0mm · 0.75mm/px · 2 of 23 slices shown]
[im 1/23]
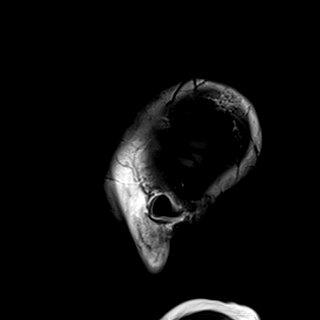
[im 23/23]
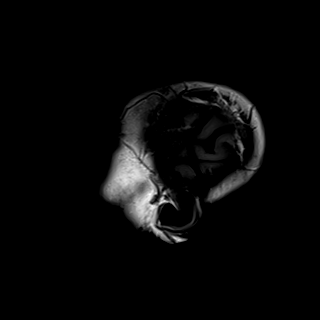

[Series 14: T2 · axial · 5.0mm · 0.72mm/px · z∈[-113,+30]mm · 2 of 25 slices shown (1 of 2)]
[im 1/25]
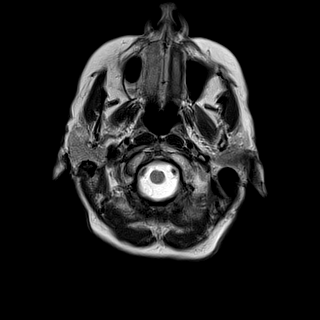
[im 25/25]
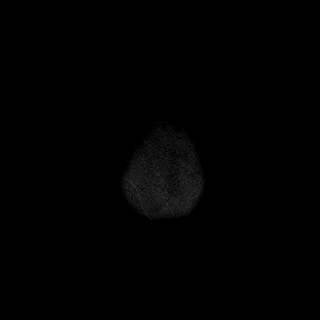

[Series 15: FLAIR · axial · 5.0mm · 0.45mm/px · z∈[-110,+33]mm · 2 of 25 slices shown]
[im 1/25]
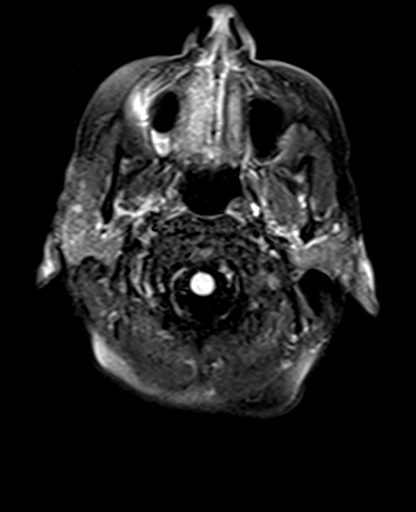
[im 25/25]
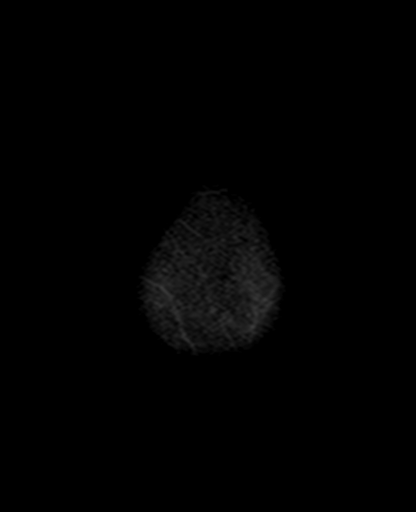

[Series 17: pha_images · axial · 3.0mm · 0.90mm/px · z∈[-126,+37]mm · 4 of 56 slices shown]
[im 1/56]
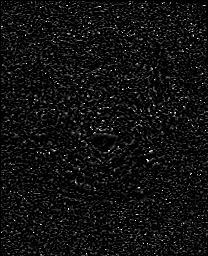
[im 19/56]
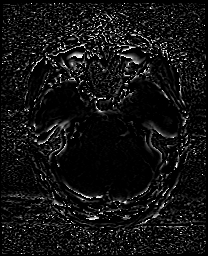
[im 37/56]
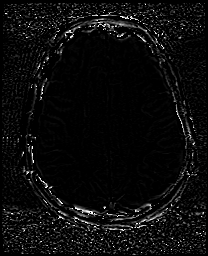
[im 56/56]
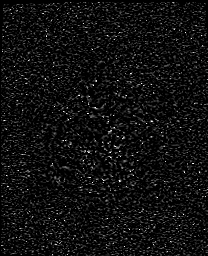

[Series 18: swi_images · axial · 3.0mm · 0.90mm/px · z∈[-126,+49]mm · 4 of 60 slices shown]
[im 1/60]
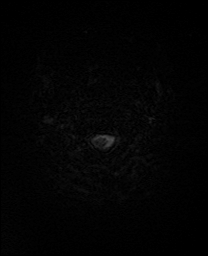
[im 20/60]
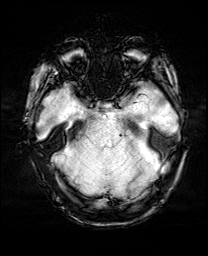
[im 40/60]
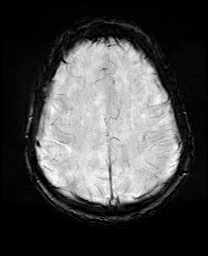
[im 60/60]
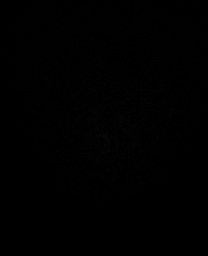

[Series 21: T2 · coronal · 5.0mm · 0.34mm/px · 2 of 29 slices shown (2 of 2)]
[im 1/29]
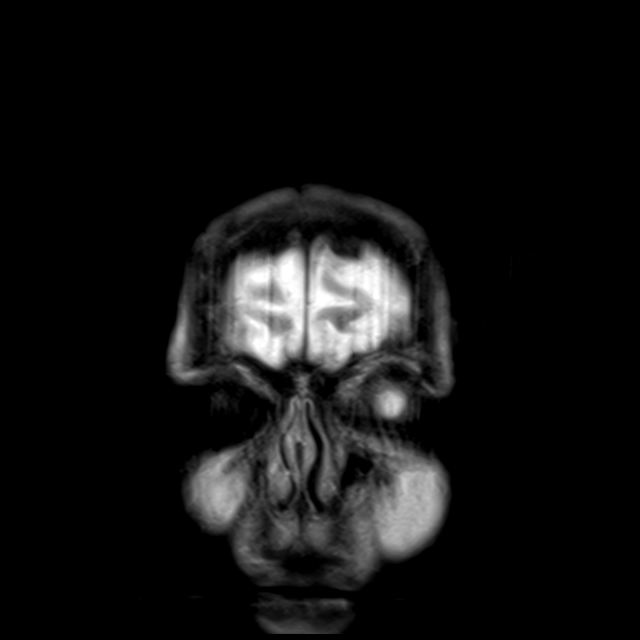
[im 29/29]
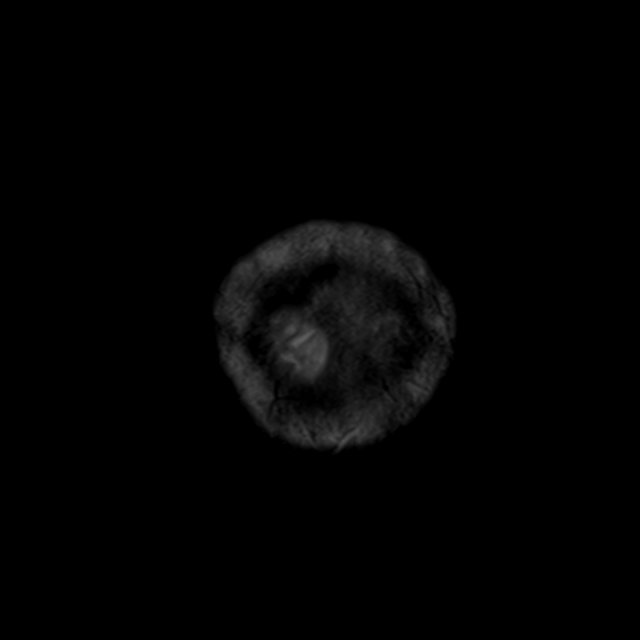

[33 of 48 positions shown; findings below may reference images not displayed]

FINDINGS: MRI HEAD FINDINGS

Brain: Generalized age-related cerebral volume loss. Scattered
patchy T2/FLAIR hyperintensity involving the periventricular deep
white matter both cerebral hemispheres, most like related chronic
microvascular ischemic disease, mild for age.

2.5 cm curvilinear ischemic infarcts seen involving the left basal
ganglia. No associated hemorrhage or mass effect. No other evidence
for acute or subacute ischemia. No other areas of chronic cortical
infarction. No acute or chronic intracranial blood products.

No mass lesion, midline shift or mass effect. No hydrocephalus or
extra-axial fluid collection. Partially empty sella noted.

Vascular: Major intracranial vascular flow voids are maintained.

Skull and upper cervical spine: Cranial junction within normal
limits. Bone marrow signal intensity within normal limits. No scalp
soft tissue abnormality.

Sinuses/Orbits: Prior bilateral ocular lens replacement. Scattered
mucosal thickening noted about the ethmoidal air cells. Paranasal
sinuses are otherwise clear. No significant mastoid effusion. Inner
ear structures grossly normal.

Other: None.

MRA HEAD FINDINGS

Anterior circulation: Examination mildly degraded by motion.

Both internal carotid arteries patent to the termini without
stenosis or other abnormality. A1 segments patent. Normal anterior
communicating artery complex. Anterior cerebral arteries patent
without significant stenosis. No M1 stenosis or occlusion. No
proximal MCA branch occlusion. Distal MCA branches perfused and
symmetric. Mild distal small vessel atheromatous irregularity.

Posterior circulation: Both V4 segments patent without stenosis.
Right vertebral artery slightly dominant. Partially visualized PICA
patent bilaterally. Basilar patent to its distal aspect without
stenosis. Superior cerebellar and posterior cerebral arteries patent
bilaterally.

Anatomic variants: None significant.  No aneurysm.

MRA NECK FINDINGS

Aortic arch: Examination technically limited due to timing of the
contrast bolus, with no contrast seen within the arterial system.

Aortic arch and origin of the great vessels not seen or assessed on
this exam.

Right carotid system: Visualized right CCA patent without stenosis.
Mild atheromatous irregularity about the right carotid bulb/proximal
right ICA without hemodynamically greater than 50% stenosis. Right
ICA patent distally without stenosis or evidence for dissection.

Left carotid system: Eccentric plaque noted within the mid-distal
left CCA without significant stenosis. Mild for age atheromatous
irregularity about the left carotid bulb/proximal left ICA without
hemodynamically significant greater than 50% stenosis. Left ICA
patent distally without stenosis or evidence for dissection.

Vertebral arteries: Both vertebral arteries appear to arise from the
subclavian arteries. No visible proximal subclavian artery stenosis.
Vertebral arteries patent within the neck without visible stenosis
or evidence for dissection.

Other: None
IMPRESSION: MRI HEAD:

1. 2.5 cm acute ischemic nonhemorrhagic left basal ganglia infarct.
2. Underlying age-related cerebral volume loss with mild chronic
microvascular ischemic disease.

MRA HEAD:

1. Negative intracranial MRA for large vessel occlusion. No
hemodynamically significant or correctable stenosis.
2. Mild distal small vessel atheromatous irregularity.

MRA NECK:

1. Technically limited exam due to timing of the contrast bolus,
with no contrast seen within the arterial system of the neck.
2. Mild atherosclerotic change about the carotid
bifurcations/proximal ICAs bilaterally without hemodynamically
significant greater than 50% stenosis.
3. Patent vertebral arteries within the neck without significant
stenosis or other acute finding.

## 2022-03-03 IMAGING — MR MR MRA NECK WO/W CM
4 of 5 series · 25 of 48 positions shown · IV contrast (agent unspecified)
Comparison: Prior head CT from [DATE].

CLINICAL DATA: Initial evaluation for neuro deficit, stroke
suspected.

EXAM:
MRI HEAD WITHOUT CONTRAST
MRA HEAD WITHOUT CONTRAST
MRA NECK WITHOUT AND WITH CONTRAST
TECHNIQUE: Multiplanar, multi-echo pulse sequences of the brain and surrounding
structures were acquired without intravenous contrast. Angiographic
images of the Circle of Willis were acquired using MRA technique
without intravenous contrast. Angiographic images of the neck were
acquired using MRA technique without and with intravenous contrast.
Carotid stenosis measurements (when applicable) are obtained
utilizing NASCET criteria, using the distal internal carotid
diameter as the denominator.
CONTRAST:  Please see contrast documentation.

[Series 7: tof_fl3d_tra_iso · axial · B · 0.6mm · 0.52mm/px · z∈[-48,+89]mm · 9 of 257 slices shown]
[im 14/257]
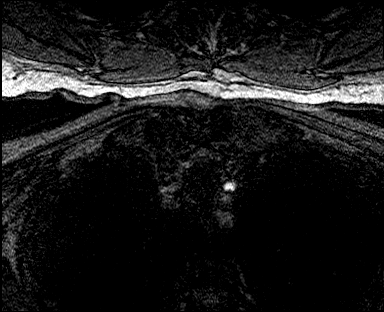
[im 41/257]
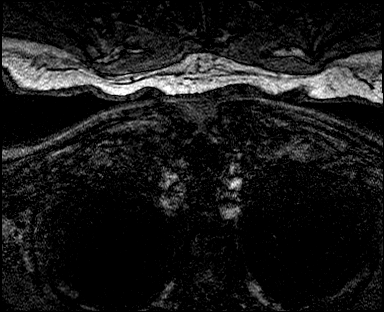
[im 81/257]
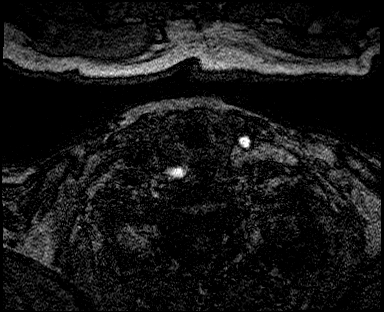
[im 108/257]
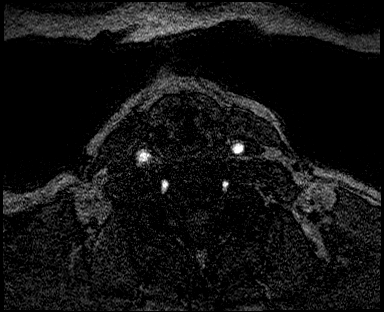
[im 135/257]
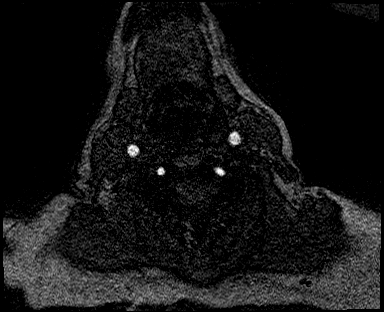
[im 149/257]
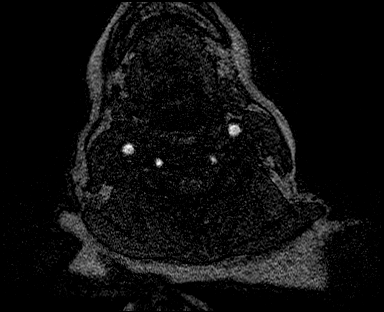
[im 176/257]
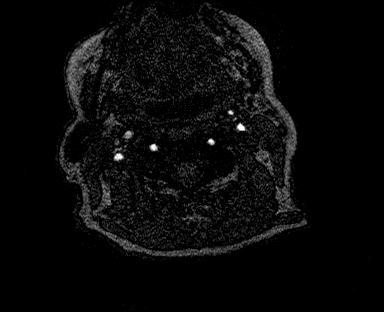
[im 216/257]
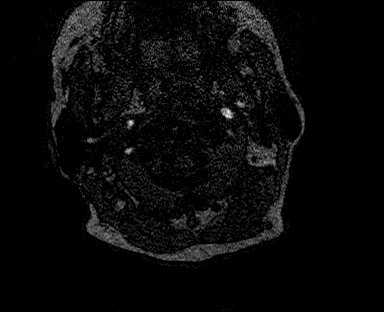
[im 243/257]
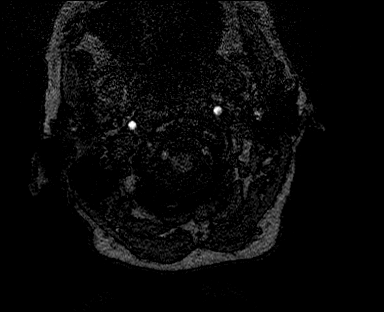

[Series 10: angio_fl3d_cor_highres_pre_ttc=3.0s · coronal · B · 0.9mm · 0.62mm/px · 7 of 80 slices shown]
[im 1/80]
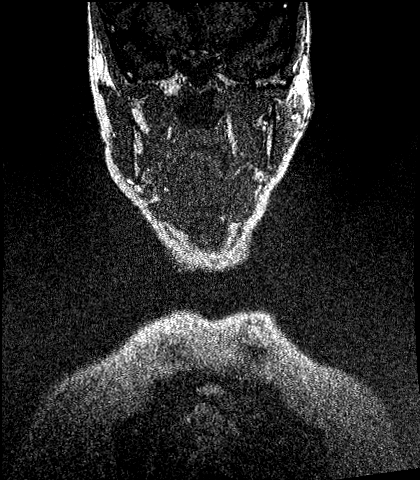
[im 14/80]
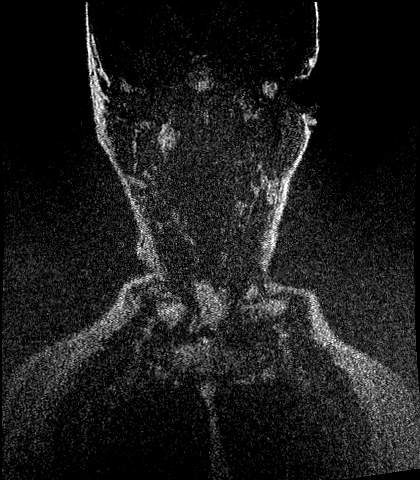
[im 27/80]
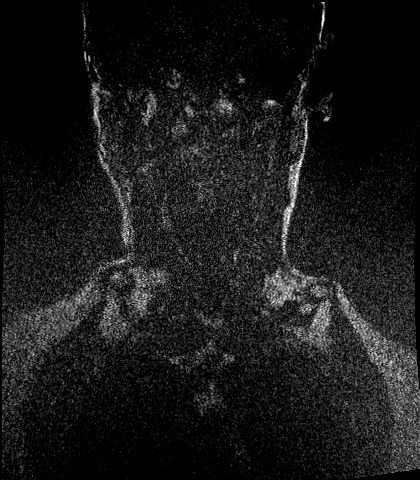
[im 40/80]
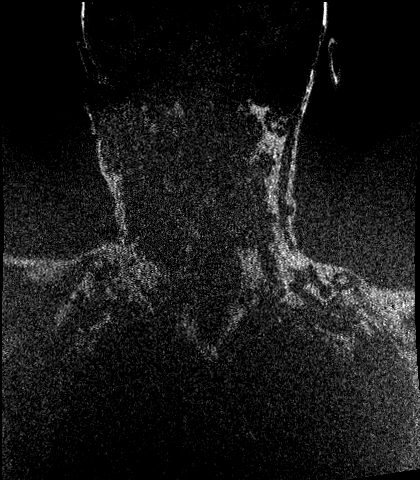
[im 53/80]
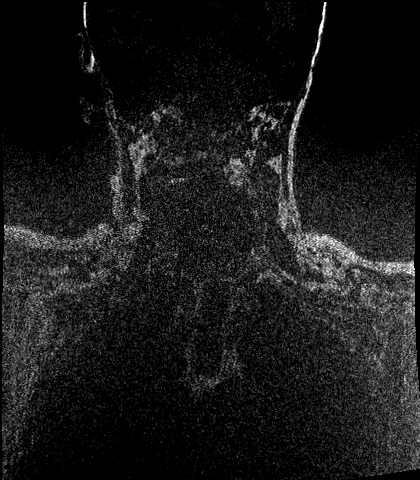
[im 66/80]
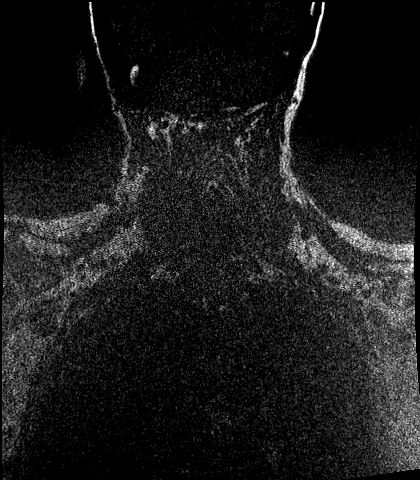
[im 80/80]
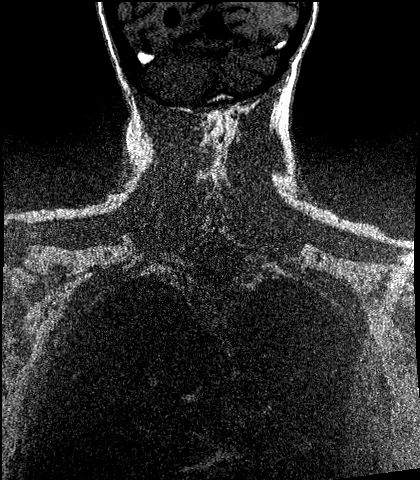

[Series 12: angio_fl3d_cor_highres_post_ttc=3.0s · coronal · B · 0.9mm · 0.62mm/px · 6 of 80 slices shown]
[im 1/80]
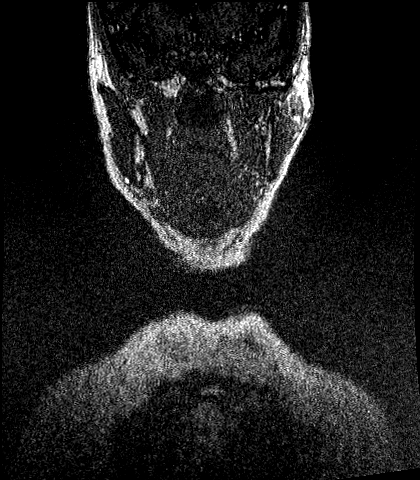
[im 14/80]
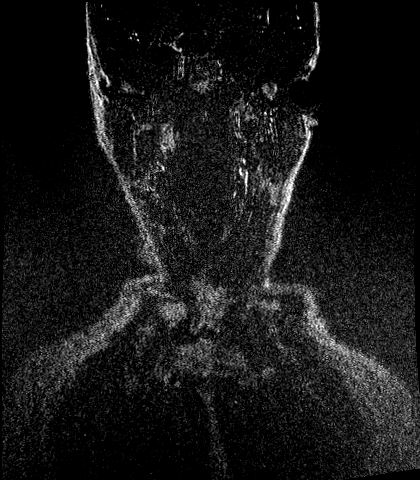
[im 27/80]
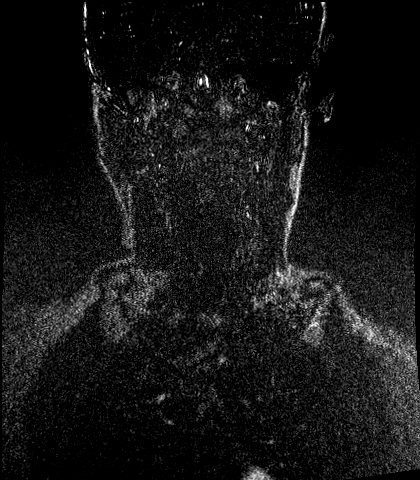
[im 40/80]
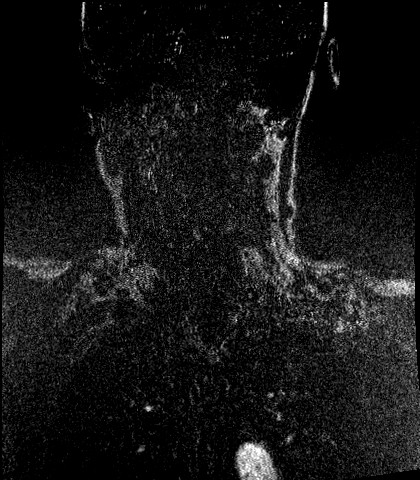
[im 53/80]
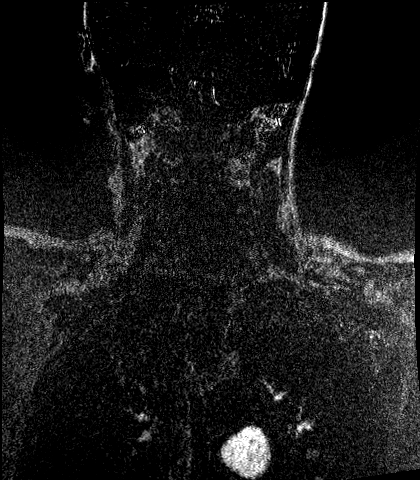
[im 66/80]
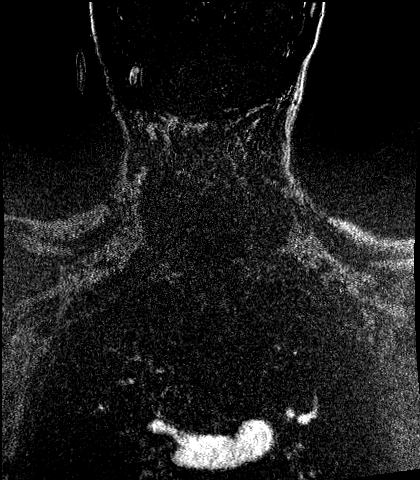

[Series 13: angio_fl3d_cor_highres_post_ttc=3.0s_moco-adv · coronal · B · 0.9mm · 0.62mm/px · 3 of 80 slices shown]
[im 14/80]
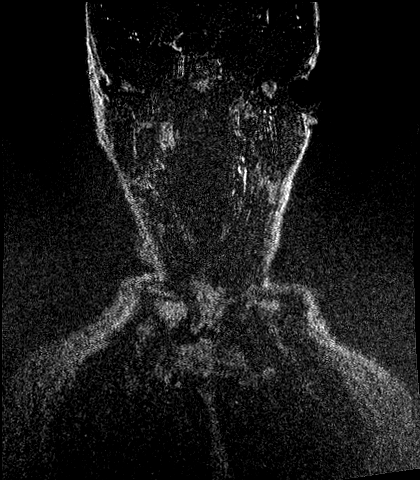
[im 40/80]
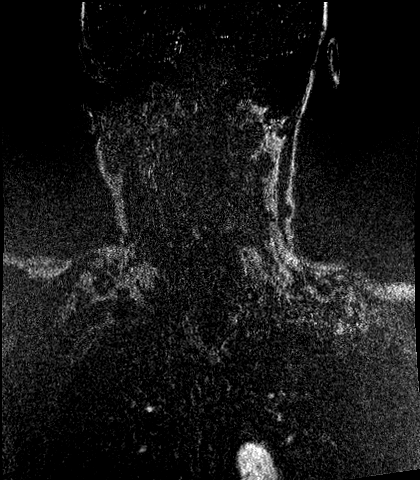
[im 66/80]
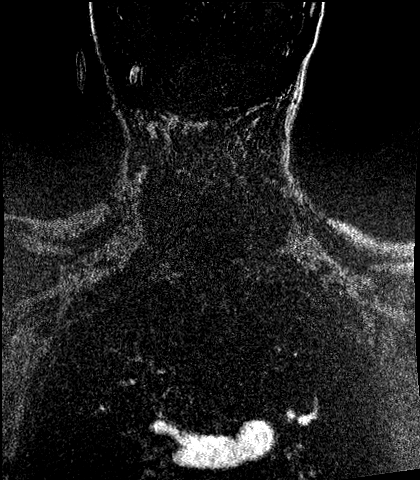

[25 of 48 positions shown; findings below may reference images not displayed]

FINDINGS: MRI HEAD FINDINGS

Brain: Generalized age-related cerebral volume loss. Scattered
patchy T2/FLAIR hyperintensity involving the periventricular deep
white matter both cerebral hemispheres, most like related chronic
microvascular ischemic disease, mild for age.

2.5 cm curvilinear ischemic infarcts seen involving the left basal
ganglia. No associated hemorrhage or mass effect. No other evidence
for acute or subacute ischemia. No other areas of chronic cortical
infarction. No acute or chronic intracranial blood products.

No mass lesion, midline shift or mass effect. No hydrocephalus or
extra-axial fluid collection. Partially empty sella noted.

Vascular: Major intracranial vascular flow voids are maintained.

Skull and upper cervical spine: Cranial junction within normal
limits. Bone marrow signal intensity within normal limits. No scalp
soft tissue abnormality.

Sinuses/Orbits: Prior bilateral ocular lens replacement. Scattered
mucosal thickening noted about the ethmoidal air cells. Paranasal
sinuses are otherwise clear. No significant mastoid effusion. Inner
ear structures grossly normal.

Other: None.

MRA HEAD FINDINGS

Anterior circulation: Examination mildly degraded by motion.

Both internal carotid arteries patent to the termini without
stenosis or other abnormality. A1 segments patent. Normal anterior
communicating artery complex. Anterior cerebral arteries patent
without significant stenosis. No M1 stenosis or occlusion. No
proximal MCA branch occlusion. Distal MCA branches perfused and
symmetric. Mild distal small vessel atheromatous irregularity.

Posterior circulation: Both V4 segments patent without stenosis.
Right vertebral artery slightly dominant. Partially visualized PICA
patent bilaterally. Basilar patent to its distal aspect without
stenosis. Superior cerebellar and posterior cerebral arteries patent
bilaterally.

Anatomic variants: None significant.  No aneurysm.

MRA NECK FINDINGS

Aortic arch: Examination technically limited due to timing of the
contrast bolus, with no contrast seen within the arterial system.

Aortic arch and origin of the great vessels not seen or assessed on
this exam.

Right carotid system: Visualized right CCA patent without stenosis.
Mild atheromatous irregularity about the right carotid bulb/proximal
right ICA without hemodynamically greater than 50% stenosis. Right
ICA patent distally without stenosis or evidence for dissection.

Left carotid system: Eccentric plaque noted within the mid-distal
left CCA without significant stenosis. Mild for age atheromatous
irregularity about the left carotid bulb/proximal left ICA without
hemodynamically significant greater than 50% stenosis. Left ICA
patent distally without stenosis or evidence for dissection.

Vertebral arteries: Both vertebral arteries appear to arise from the
subclavian arteries. No visible proximal subclavian artery stenosis.
Vertebral arteries patent within the neck without visible stenosis
or evidence for dissection.

Other: None
IMPRESSION: MRI HEAD:

1. 2.5 cm acute ischemic nonhemorrhagic left basal ganglia infarct.
2. Underlying age-related cerebral volume loss with mild chronic
microvascular ischemic disease.

MRA HEAD:

1. Negative intracranial MRA for large vessel occlusion. No
hemodynamically significant or correctable stenosis.
2. Mild distal small vessel atheromatous irregularity.

MRA NECK:

1. Technically limited exam due to timing of the contrast bolus,
with no contrast seen within the arterial system of the neck.
2. Mild atherosclerotic change about the carotid
bifurcations/proximal ICAs bilaterally without hemodynamically
significant greater than 50% stenosis.
3. Patent vertebral arteries within the neck without significant
stenosis or other acute finding.

## 2022-03-03 MED ORDER — ENOXAPARIN SODIUM 40 MG/0.4ML IJ SOSY
40.0000 mg | PREFILLED_SYRINGE | Freq: Every day | INTRAMUSCULAR | Status: DC
  Administered 2022-03-03 – 2022-03-07 (×5): 40 mg via SUBCUTANEOUS
  Filled 2022-03-03 (×5): qty 0.4

## 2022-03-03 MED ORDER — LORAZEPAM 0.5 MG PO TABS
0.5000 mg | ORAL_TABLET | Freq: Two times a day (BID) | ORAL | Status: DC | PRN
  Administered 2022-03-03 – 2022-03-04 (×3): 0.5 mg via ORAL
  Filled 2022-03-03 (×3): qty 1

## 2022-03-03 MED ORDER — LEVOTHYROXINE SODIUM 50 MCG PO TABS
50.0000 ug | ORAL_TABLET | Freq: Every day | ORAL | Status: DC
  Administered 2022-03-03 – 2022-03-07 (×5): 50 ug via ORAL
  Filled 2022-03-03 (×4): qty 1

## 2022-03-03 MED ORDER — ASPIRIN 81 MG PO TBEC
81.0000 mg | DELAYED_RELEASE_TABLET | Freq: Every day | ORAL | Status: DC
Start: 2022-03-03 — End: 2022-03-07
  Administered 2022-03-03 – 2022-03-07 (×5): 81 mg via ORAL
  Filled 2022-03-03 (×5): qty 1

## 2022-03-03 MED ORDER — CLOPIDOGREL BISULFATE 75 MG PO TABS
75.0000 mg | ORAL_TABLET | Freq: Every day | ORAL | Status: DC
  Administered 2022-03-03 – 2022-03-07 (×5): 75 mg via ORAL
  Filled 2022-03-03 (×5): qty 1

## 2022-03-03 MED ORDER — SODIUM CHLORIDE 0.9 % IV SOLN
INTRAVENOUS | Status: DC
Start: 2022-03-03 — End: 2022-03-04

## 2022-03-03 MED ORDER — ATORVASTATIN CALCIUM 80 MG PO TABS
80.0000 mg | ORAL_TABLET | Freq: Every day | ORAL | Status: DC
Start: 2022-03-03 — End: 2022-03-07
  Administered 2022-03-03 – 2022-03-07 (×5): 80 mg via ORAL
  Filled 2022-03-03 (×5): qty 1

## 2022-03-03 MED ORDER — LABETALOL HCL 5 MG/ML IV SOLN: 5.0000 mg | INTRAVENOUS | Status: DC | PRN

## 2022-03-03 MED ORDER — GADOBUTROL 1 MMOL/ML IV SOLN
7.0000 mL | Freq: Once | INTRAVENOUS | Status: AC | PRN
  Administered 2022-03-03: 7 mL via INTRAVENOUS

## 2022-03-03 NOTE — Evaluation (Signed)
Clinical/Bedside Swallow Evaluation Patient Details  Name: Tamara Bullock MRN: [id number] Date of Birth: 26-Jun-1941  Today's Date: 03/03/2022 Time: SLP Start Time (ACUTE ONLY): 0845 SLP Stop Time (ACUTE ONLY): 0910 SLP Time Calculation (min) (ACUTE ONLY): 25 min  Past Medical History: No past medical history on file.  HPI:  Tamara Bullock is a 81 y.o. female who presented to [facility] ED from home via EMS due to sudden onset expressive aphasia and  right sided weakness.  Prior medical history significant for generalized anxiety, essential hypertension.  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    Assessment / Plan / Recommendation  Clinical Impression  Pt presents with a mild oral dysphagia c/b decreased ROM/strength of tongue (XII) and leading to slowed mastication, intermittent oral residue post-swallow.  Pharyngeal function appeared to be WNL with no s/s of aspiration.  Pt presented with decreased initiation and effort - required cues to persist through task.  Will need assist with self-feeding and encouragement.  Recommend resuming a regular diet/thin liquids; give meds whole in puree. D/W her son and dtr at bedside; D/W RN.  SLP will follow. SLP Visit Diagnosis: Dysphagia, oral phase (R13.11)          Diet Recommendation   Regular solids, thin liquids  Medication Administration: Whole meds with puree    Other  Recommendations Oral Care Recommendations: Oral care BID    Recommendations for follow up therapy are one component of a multi-disciplinary discharge planning process, led by the attending physician.  Recommendations may be updated based on patient status, additional functional criteria and insurance authorization.  Follow up Recommendations Acute inpatient rehab (3hours/day)      Assistance Recommended at Discharge Intermittent Supervision/Assistance  Functional Status Assessment Patient has had a recent decline in their functional status and demonstrates the  ability to make significant improvements in function in a reasonable and predictable amount of time.  Frequency and Duration min 2x/week  2 weeks               Swallow Study   General Date of Onset: 03/02/22 HPI: Tamara Bullock is a 81 y.o. female who presented to [facility] ED from home via EMS due to sudden onset expressive aphasia and  right sided weakness.  Prior medical history significant for generalized anxiety, essential hypertension.  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 Type of Study: Bedside Swallow Evaluation Previous Swallow Assessment: no Diet Prior to this Study: NPO Temperature Spikes Noted: No Respiratory Status: Room air History of Recent Intubation: No Behavior/Cognition: Alert Oral Cavity Assessment: Within Functional Limits Oral Care Completed by SLP: Yes Oral Cavity - Dentition: Dentures, top Vision: Functional for self-feeding Self-Feeding Abilities: Needs assist Patient Positioning: Upright in bed Baseline Vocal Quality: Normal Volitional Cough: Weak Volitional Swallow: Able to elicit    Oral/Motor/Sensory Function Overall Oral Motor/Sensory Function: Mild impairment Facial Strength: Reduced right Lingual ROM: Other (Comment) (symmetric but reduced extension) Lingual Symmetry: Other (Comment)   Ice Chips Ice chips: Within functional limits   Thin Liquid Thin Liquid: Within functional limits    Nectar Thick Nectar Thick Liquid: Not tested   Honey Thick Honey Thick Liquid: Not tested   Puree Puree: Impaired Presentation: Spoon Oral Phase Functional Implications: Prolonged oral transit   Solid     Solid: Impaired Presentation: Self Fed Oral Phase Functional Implications: Prolonged oral transit;Impaired mastication      Tamara Bullock 03/03/2022,9:50 AM   Tamara Folks L. Juelz Claar, MA CCC/SLP Acute Rehabilitation  Services Office number [phone] Pager [phone]

## 2022-03-03 NOTE — Progress Notes (Signed)
Inpatient Rehab Admissions:  Inpatient Rehab Consult received.  I met with patient, son Mortimer Fries and daughter Lisabeth Pick at the bedside for rehabilitation assessment and to discuss goals and expectations of an inpatient rehab admission.  Mortimer Fries and Lisabeth Pick acknowledged understanding of CIR goals and expectations. They are interested in pt pursuing CIR. They confirmed that family will be able to provide support for pt after discharge. Will continue to follow.  Signed: Gayland Curry, [place], [place] Admissions Coordinator [phone]

## 2022-03-03 NOTE — Progress Notes (Signed)
This is a no charge note, for further details please see the H&P by my partner Dr. Nevada Crane from a few hours ago.  I reviewed her orders, and she has appropriate standard of care orders for stroke management.  Serial neurology checks so far have been stable.  She is in good spirits.  PT recommending rehab, and said that she required 2 person help to walk just a few feet.

## 2022-03-03 NOTE — Evaluation (Signed)
Physical Therapy Evaluation Patient Details Name: Tamara Bullock MRN: [id number] DOB: [birth year] Today's Date: 03/03/2022  History of Present Illness  Tamara Bullock is a 81 y.o. female admitted 6/2  to [facility] ED from home via EMS due to sudden onset expressive aphasia.  Associated with right sided weakness.  Last known well was 03/02/22 around 11 AM.  Per EMS her SBP was >200 on arrival.  Presented outside of window for TPA.  Upon presentation to the ED, she was noted to be aphasic with decrease grip strength on the right.  CT head negative for any acute intracranial findings.  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 PMH:  generalized anxiety, essential hypertension, CKD 3B  Clinical Impression  Pt admitted with above diagnosis. Pt was able to ambulate with bil HHA with +2 mod assist with poor gait stability due to right lateral lean and posterior lean.  Pt would benefit from AIR at d/c and family is supportive.  Wife was a caregiver for husband PTA and was Independent with ambulation and still driving.  Daughter states she can assist at d/c.  Will follow acutely.  Pt currently with functional limitations due to the deficits listed below (see PT Problem List). Pt will benefit from skilled PT to increase their independence and safety with mobility to allow discharge to the venue listed below.          Recommendations for follow up therapy are one component of a multi-disciplinary discharge planning process, led by the attending physician.  Recommendations may be updated based on patient status, additional functional criteria and insurance authorization.  Follow Up Recommendations Acute inpatient rehab (3hours/day)    Assistance Recommended at Discharge Frequent or constant Supervision/Assistance  Patient can return home with the following  A lot of help with walking and/or transfers;A lot of help with bathing/dressing/bathroom;Direct supervision/assist for medications management;Direct  supervision/assist for financial management;Assistance with cooking/housework;Assist for transportation;Help with stairs or ramp for entrance    Equipment Recommendations    Recommendations for Other Services  Rehab consult    Functional Status Assessment Patient has had a recent decline in their functional status and demonstrates the ability to make significant improvements in function in a reasonable and predictable amount of time.     Precautions / Restrictions Precautions Precautions: Fall Restrictions Weight Bearing Restrictions: No      Mobility  Bed Mobility Overal bed mobility: Needs Assistance Bed Mobility: Supine to Sit     Supine to sit: Min guard, Min assist     General bed mobility comments: Took incr time for pt to sit up.  Needed assist to scoot to [facility] of stretcher.    Transfers Overall transfer level: Needs assistance Equipment used: 2 person hand held assist Transfers: Sit to/from Stand Sit to Stand: Mod assist           General transfer comment: Pt needed steadying assist as pt with right lean and posterior bias.  Relies on UE support bilaterally.    Ambulation/Gait Ambulation/Gait assistance: Mod assist, +2 physical assistance, Min assist Gait Distance (Feet): 20 Feet Assistive device: 2 person hand held assist Gait Pattern/deviations: Decreased stride length, Step-through pattern, Shuffle, Leaning posteriorly, Staggering left, Staggering right, Trunk flexed, Narrow base of support   Gait velocity interpretation: <1.31 ft/sec, indicative of household ambulator   General Gait Details: Pt unsteady and requires +2 mod assist for balance.  Pt unsteady and leans right as well as posteriorly at times. Requires bil UE support.  Poor balance  reactions.  Pt with slightly flexed posture as well.  Stairs            Wheelchair Mobility    Modified Rankin (Stroke Patients Only) Modified Rankin (Stroke Patients Only) Pre-Morbid Rankin Score:  Slight disability Modified Rankin: Moderately severe disability     Balance Overall balance assessment: Needs assistance Sitting-balance support: Feet supported, Bilateral upper extremity supported, No upper extremity supported Sitting balance-Leahy Scale: Poor Sitting balance - Comments: relies on UE support.  Pt with right lean and needed cues or assist to right self to midline.  Min assist overall for sitting balance. Postural control: Right lateral lean, Posterior lean Standing balance support: Bilateral upper extremity supported, During functional activity Standing balance-Leahy Scale: Poor Standing balance comment: relies on UE support bilaterally for static stance with posterior and right lean.  Pt needs mod assist of 2 for dynamic tasks.                             Pertinent Vitals/Pain Pain Assessment Pain Assessment: No/denies pain    Home Living Family/patient expects to be discharged to:: Private residence Living Arrangements: Spouse/significant other Available Help at Discharge: Family;Available PRN/intermittently (husband has cancer, 2 daughters in area, just moved and renting home from grandson) Type of Home: House Home Access: Stairs to enter Entrance Stairs-Rails: Psychiatric nurse of Steps: 5   Home Layout: One level Home Equipment: Rollator (4 wheels) Additional Comments: pt helped husband last  weeks or so with B/D and clean him as well as help him get up as he recently broke elbpow aftger jos radoatopm complete.    Prior Function Prior Level of Function : Needs assist;History of Falls (last six months);Driving             Mobility Comments: not using device but was off balance and has fallen per family ADLs Comments: I with B/D     Hand Dominance   Dominant Hand: Right    Extremity/Trunk Assessment   Upper Extremity Assessment Upper Extremity Assessment: Defer to OT evaluation    Lower Extremity Assessment Lower  Extremity Assessment: RLE deficits/detail RLE Deficits / Details: grossly 3+/5    Cervical / Trunk Assessment Cervical / Trunk Assessment: Kyphotic  Communication   Communication: Expressive difficulties;HOH  Cognition Arousal/Alertness: Awake/alert Behavior During Therapy: Flat affect Overall Cognitive Status: History of cognitive impairments - at baseline Area of Impairment: Safety/judgement, Problem solving                         Safety/Judgement: Decreased awareness of deficits, Decreased awareness of safety   Problem Solving: Requires verbal cues, Slow processing General Comments: family reports that pt has dementia        General Comments General comments (skin integrity, edema, etc.): 60 bpm, 94% RA    Exercises     Assessment/Plan    PT Assessment Patient needs continued PT services  PT Problem List Decreased activity tolerance;Decreased balance;Decreased mobility;Decreased safety awareness;Decreased knowledge of use of DME;Decreased knowledge of precautions;Decreased strength       PT Treatment Interventions DME instruction;Gait training;Functional mobility training;Therapeutic activities;Therapeutic exercise;Balance training;Patient/family education;Stair training    PT Goals (Current goals can be found in the Care Plan section)  Acute Rehab PT Goals Patient Stated Goal: to get better and go home PT Goal Formulation: With patient Time For Goal Achievement: 03/17/22 Potential to Achieve Goals: Good    Frequency Min 4X/week  Co-evaluation               AM-PAC PT "6 Clicks" Mobility  Outcome Measure Help needed turning from your back to your side while in a flat bed without using bedrails?: None Help needed moving from lying on your back to sitting on the side of a flat bed without using bedrails?: A Little Help needed moving to and from a bed to a chair (including a wheelchair)?: Total Help needed standing up from a chair using your  arms (e.g., wheelchair or bedside chair)?: Total Help needed to walk in hospital room?: Total Help needed climbing 3-5 steps with a railing? : Total 6 Click Score: 11    End of Session Equipment Utilized During Treatment: Gait belt Activity Tolerance: Patient limited by fatigue Patient left: with call bell/phone within reach;with family/visitor present (on stretcher) Nurse Communication: Mobility status PT Visit Diagnosis: Unsteadiness on feet (R26.81);Muscle weakness (generalized) (M62.81)    Time: [id number] PT Time Calculation (min) (ACUTE ONLY): 32 min   Charges:   PT Evaluation $PT Eval Moderate Complexity: 1 Mod PT Treatments $Gait Training: 8-22 mins        Amro Winebarger M,PT Acute Rehab Services [phone] (pager)   Alvira Philips 03/03/2022, 8:50 AM

## 2022-03-03 NOTE — ED Notes (Signed)
Patient back from MRI.

## 2022-03-03 NOTE — Progress Notes (Addendum)
STROKE TEAM PROGRESS NOTE   INTERVAL HISTORY Her son and daughter are the bedside. They are reporting patient is having difficulty with her words and expressing herself She also has balance difficulties when walking. She has been under extreme stress taking care of her ill spouse with cancer This am her aphasia has some what improved, still  having difficulty expressive aphasia. She has been seen by speech for swallowing evaluation No c/o headache, visual changes, dizziness, syncope, numbness and tingling MRI scan shows a large 2.2 cm left basal ganglia infarct.  LDL cholesterol is 198 mg percent.  Hemoglobin A1c 6.0.  Echocardiogram is pending.  MR angiogram of the brain is negative for any large vessel stenosis or occlusion.  MRA of the neck shows mild atherosclerotic changes at both bifurcations.  Vitals:   03/03/22 0400 03/03/22 0500 03/03/22 0530 03/03/22 0600  BP: (!) 133/96 120/64 (!) 133/57 (!) 155/78  Pulse: 65 63 (!) 58 70  Resp: (!) 21 19 20 19   Temp:      TempSrc:      SpO2: 97% 93% 94% 96%   CBC:  Recent Labs  Lab 03/02/22 2157 03/02/22 2207 03/03/22 0414  WBC 6.5  --  9.4  NEUTROABS 3.4  --   --   HGB 12.2 12.6 12.4  HCT 35.8* 37.0 35.6*  MCV 89.9  --  88.6  PLT 290  --  0000000   Basic Metabolic Panel:  Recent Labs  Lab 03/02/22 2157 03/02/22 2207 03/03/22 0414  NA 136 135 137  K 3.7 3.7 3.6  CL 98 98 102  CO2 26  --  25  GLUCOSE 123* 118* 114*  BUN 28* 29* 26*  CREATININE 1.14* 1.20* 1.05*  CALCIUM 9.7  --  9.6  MG  --   --  2.3  PHOS  --   --  3.4   Lipid Panel:  Recent Labs  Lab 03/03/22 0414  CHOL 278*  TRIG 112  HDL 58  CHOLHDL 4.8  VLDL 22  LDLCALC 198*   HgbA1c:  Recent Labs  Lab 03/03/22 0414  HGBA1C 6.0*   Urine Drug Screen:  Recent Labs  Lab 03/03/22 0457  LABOPIA NONE DETECTED  COCAINSCRNUR NONE DETECTED  LABBENZ NONE DETECTED  AMPHETMU NONE DETECTED  THCU NONE DETECTED  LABBARB NONE DETECTED    Alcohol Level  Recent  Labs  Lab 03/02/22 2157  ETH <10    IMAGING past 24 hours MR ANGIO HEAD WO CONTRAST  Result Date: 03/03/2022 CLINICAL DATA:  Initial evaluation for neuro deficit, stroke suspected. EXAM: MRI HEAD WITHOUT CONTRAST MRA HEAD WITHOUT CONTRAST MRA NECK WITHOUT AND WITH CONTRAST TECHNIQUE: Multiplanar, multi-echo pulse sequences of the brain and surrounding structures were acquired without intravenous contrast. Angiographic images of the Circle of Willis were acquired using MRA technique without intravenous contrast. Angiographic images of the neck were acquired using MRA technique without and with intravenous contrast. Carotid stenosis measurements (when applicable) are obtained utilizing NASCET criteria, using the distal internal carotid diameter as the denominator. CONTRAST:  Please see contrast documentation. COMPARISON:  Prior head CT from 03/02/2022. FINDINGS: MRI HEAD FINDINGS Brain: Generalized age-related cerebral volume loss. Scattered patchy T2/FLAIR hyperintensity involving the periventricular deep white matter both cerebral hemispheres, most like related chronic microvascular ischemic disease, mild for age. 2.5 cm curvilinear ischemic infarcts seen involving the left basal ganglia. No associated hemorrhage or mass effect. No other evidence for acute or subacute ischemia. No other areas of chronic cortical infarction. No acute  or chronic intracranial blood products. No mass lesion, midline shift or mass effect. No hydrocephalus or extra-axial fluid collection. Partially empty sella noted. Vascular: Major intracranial vascular flow voids are maintained. Skull and upper cervical spine: Cranial junction within normal limits. Bone marrow signal intensity within normal limits. No scalp soft tissue abnormality. Sinuses/Orbits: Prior bilateral ocular lens replacement. Scattered mucosal thickening noted about the ethmoidal air cells. Paranasal sinuses are otherwise clear. No significant mastoid effusion. Inner  ear structures grossly normal. Other: None. MRA HEAD FINDINGS Anterior circulation: Examination mildly degraded by motion. Both internal carotid arteries patent to the termini without stenosis or other abnormality. A1 segments patent. Normal anterior communicating artery complex. Anterior cerebral arteries patent without significant stenosis. No M1 stenosis or occlusion. No proximal MCA branch occlusion. Distal MCA branches perfused and symmetric. Mild distal small vessel atheromatous irregularity. Posterior circulation: Both V4 segments patent without stenosis. Right vertebral artery slightly dominant. Partially visualized PICA patent bilaterally. Basilar patent to its distal aspect without stenosis. Superior cerebellar and posterior cerebral arteries patent bilaterally. Anatomic variants: None significant.  No aneurysm. MRA NECK FINDINGS Aortic arch: Examination technically limited due to timing of the contrast bolus, with no contrast seen within the arterial system. Aortic arch and origin of the great vessels not seen or assessed on this exam. Right carotid system: Visualized right CCA patent without stenosis. Mild atheromatous irregularity about the right carotid bulb/proximal right ICA without hemodynamically greater than 50% stenosis. Right ICA patent distally without stenosis or evidence for dissection. Left carotid system: Eccentric plaque noted within the mid-distal left CCA without significant stenosis. Mild for age atheromatous irregularity about the left carotid bulb/proximal left ICA without hemodynamically significant greater than 50% stenosis. Left ICA patent distally without stenosis or evidence for dissection. Vertebral arteries: Both vertebral arteries appear to arise from the subclavian arteries. No visible proximal subclavian artery stenosis. Vertebral arteries patent within the neck without visible stenosis or evidence for dissection. Other: None IMPRESSION: MRI HEAD: 1. 2.5 cm acute ischemic  nonhemorrhagic left basal ganglia infarct. 2. Underlying age-related cerebral volume loss with mild chronic microvascular ischemic disease. MRA HEAD: 1. Negative intracranial MRA for large vessel occlusion. No hemodynamically significant or correctable stenosis. 2. Mild distal small vessel atheromatous irregularity. MRA NECK: 1. Technically limited exam due to timing of the contrast bolus, with no contrast seen within the arterial system of the neck. 2. Mild atherosclerotic change about the carotid bifurcations/proximal ICAs bilaterally without hemodynamically significant greater than 50% stenosis. 3. Patent vertebral arteries within the neck without significant stenosis or other acute finding. Electronically Signed   By: Jeannine Boga M.D.   On: 03/03/2022 02:47   MR Angiogram Neck W or Wo Contrast  Result Date: 03/03/2022 CLINICAL DATA:  Initial evaluation for neuro deficit, stroke suspected. EXAM: MRI HEAD WITHOUT CONTRAST MRA HEAD WITHOUT CONTRAST MRA NECK WITHOUT AND WITH CONTRAST TECHNIQUE: Multiplanar, multi-echo pulse sequences of the brain and surrounding structures were acquired without intravenous contrast. Angiographic images of the Circle of Willis were acquired using MRA technique without intravenous contrast. Angiographic images of the neck were acquired using MRA technique without and with intravenous contrast. Carotid stenosis measurements (when applicable) are obtained utilizing NASCET criteria, using the distal internal carotid diameter as the denominator. CONTRAST:  Please see contrast documentation. COMPARISON:  Prior head CT from 03/02/2022. FINDINGS: MRI HEAD FINDINGS Brain: Generalized age-related cerebral volume loss. Scattered patchy T2/FLAIR hyperintensity involving the periventricular deep white matter both cerebral hemispheres, most like related chronic microvascular ischemic disease, mild  for age. 2.5 cm curvilinear ischemic infarcts seen involving the left basal ganglia. No  associated hemorrhage or mass effect. No other evidence for acute or subacute ischemia. No other areas of chronic cortical infarction. No acute or chronic intracranial blood products. No mass lesion, midline shift or mass effect. No hydrocephalus or extra-axial fluid collection. Partially empty sella noted. Vascular: Major intracranial vascular flow voids are maintained. Skull and upper cervical spine: Cranial junction within normal limits. Bone marrow signal intensity within normal limits. No scalp soft tissue abnormality. Sinuses/Orbits: Prior bilateral ocular lens replacement. Scattered mucosal thickening noted about the ethmoidal air cells. Paranasal sinuses are otherwise clear. No significant mastoid effusion. Inner ear structures grossly normal. Other: None. MRA HEAD FINDINGS Anterior circulation: Examination mildly degraded by motion. Both internal carotid arteries patent to the termini without stenosis or other abnormality. A1 segments patent. Normal anterior communicating artery complex. Anterior cerebral arteries patent without significant stenosis. No M1 stenosis or occlusion. No proximal MCA branch occlusion. Distal MCA branches perfused and symmetric. Mild distal small vessel atheromatous irregularity. Posterior circulation: Both V4 segments patent without stenosis. Right vertebral artery slightly dominant. Partially visualized PICA patent bilaterally. Basilar patent to its distal aspect without stenosis. Superior cerebellar and posterior cerebral arteries patent bilaterally. Anatomic variants: None significant.  No aneurysm. MRA NECK FINDINGS Aortic arch: Examination technically limited due to timing of the contrast bolus, with no contrast seen within the arterial system. Aortic arch and origin of the great vessels not seen or assessed on this exam. Right carotid system: Visualized right CCA patent without stenosis. Mild atheromatous irregularity about the right carotid bulb/proximal right ICA without  hemodynamically greater than 50% stenosis. Right ICA patent distally without stenosis or evidence for dissection. Left carotid system: Eccentric plaque noted within the mid-distal left CCA without significant stenosis. Mild for age atheromatous irregularity about the left carotid bulb/proximal left ICA without hemodynamically significant greater than 50% stenosis. Left ICA patent distally without stenosis or evidence for dissection. Vertebral arteries: Both vertebral arteries appear to arise from the subclavian arteries. No visible proximal subclavian artery stenosis. Vertebral arteries patent within the neck without visible stenosis or evidence for dissection. Other: None IMPRESSION: MRI HEAD: 1. 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 2. Underlying age-related cerebral volume loss with mild chronic microvascular ischemic disease. MRA HEAD: 1. Negative intracranial MRA for large vessel occlusion. No hemodynamically significant or correctable stenosis. 2. Mild distal small vessel atheromatous irregularity. MRA NECK: 1. Technically limited exam due to timing of the contrast bolus, with no contrast seen within the arterial system of the neck. 2. Mild atherosclerotic change about the carotid bifurcations/proximal ICAs bilaterally without hemodynamically significant greater than 50% stenosis. 3. Patent vertebral arteries within the neck without significant stenosis or other acute finding. Electronically Signed   By: Jeannine Boga M.D.   On: 03/03/2022 02:47   MR BRAIN WO CONTRAST  Result Date: 03/03/2022 CLINICAL DATA:  Initial evaluation for neuro deficit, stroke suspected. EXAM: MRI HEAD WITHOUT CONTRAST MRA HEAD WITHOUT CONTRAST MRA NECK WITHOUT AND WITH CONTRAST TECHNIQUE: Multiplanar, multi-echo pulse sequences of the brain and surrounding structures were acquired without intravenous contrast. Angiographic images of the Circle of Willis were acquired using MRA technique without  intravenous contrast. Angiographic images of the neck were acquired using MRA technique without and with intravenous contrast. Carotid stenosis measurements (when applicable) are obtained utilizing NASCET criteria, using the distal internal carotid diameter as the denominator. CONTRAST:  Please see contrast documentation. COMPARISON:  Prior head  CT from 03/02/2022. FINDINGS: MRI HEAD FINDINGS Brain: Generalized age-related cerebral volume loss. Scattered patchy T2/FLAIR hyperintensity involving the periventricular deep white matter both cerebral hemispheres, most like related chronic microvascular ischemic disease, mild for age. 2.5 cm curvilinear ischemic infarcts seen involving the left basal ganglia. No associated hemorrhage or mass effect. No other evidence for acute or subacute ischemia. No other areas of chronic cortical infarction. No acute or chronic intracranial blood products. No mass lesion, midline shift or mass effect. No hydrocephalus or extra-axial fluid collection. Partially empty sella noted. Vascular: Major intracranial vascular flow voids are maintained. Skull and upper cervical spine: Cranial junction within normal limits. Bone marrow signal intensity within normal limits. No scalp soft tissue abnormality. Sinuses/Orbits: Prior bilateral ocular lens replacement. Scattered mucosal thickening noted about the ethmoidal air cells. Paranasal sinuses are otherwise clear. No significant mastoid effusion. Inner ear structures grossly normal. Other: None. MRA HEAD FINDINGS Anterior circulation: Examination mildly degraded by motion. Both internal carotid arteries patent to the termini without stenosis or other abnormality. A1 segments patent. Normal anterior communicating artery complex. Anterior cerebral arteries patent without significant stenosis. No M1 stenosis or occlusion. No proximal MCA branch occlusion. Distal MCA branches perfused and symmetric. Mild distal small vessel atheromatous  irregularity. Posterior circulation: Both V4 segments patent without stenosis. Right vertebral artery slightly dominant. Partially visualized PICA patent bilaterally. Basilar patent to its distal aspect without stenosis. Superior cerebellar and posterior cerebral arteries patent bilaterally. Anatomic variants: None significant.  No aneurysm. MRA NECK FINDINGS Aortic arch: Examination technically limited due to timing of the contrast bolus, with no contrast seen within the arterial system. Aortic arch and origin of the great vessels not seen or assessed on this exam. Right carotid system: Visualized right CCA patent without stenosis. Mild atheromatous irregularity about the right carotid bulb/proximal right ICA without hemodynamically greater than 50% stenosis. Right ICA patent distally without stenosis or evidence for dissection. Left carotid system: Eccentric plaque noted within the mid-distal left CCA without significant stenosis. Mild for age atheromatous irregularity about the left carotid bulb/proximal left ICA without hemodynamically significant greater than 50% stenosis. Left ICA patent distally without stenosis or evidence for dissection. Vertebral arteries: Both vertebral arteries appear to arise from the subclavian arteries. No visible proximal subclavian artery stenosis. Vertebral arteries patent within the neck without visible stenosis or evidence for dissection. Other: None IMPRESSION: MRI HEAD: 1. 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 2. Underlying age-related cerebral volume loss with mild chronic microvascular ischemic disease. MRA HEAD: 1. Negative intracranial MRA for large vessel occlusion. No hemodynamically significant or correctable stenosis. 2. Mild distal small vessel atheromatous irregularity. MRA NECK: 1. Technically limited exam due to timing of the contrast bolus, with no contrast seen within the arterial system of the neck. 2. Mild atherosclerotic change about  the carotid bifurcations/proximal ICAs bilaterally without hemodynamically significant greater than 50% stenosis. 3. Patent vertebral arteries within the neck without significant stenosis or other acute finding. Electronically Signed   By: Jeannine Boga M.D.   On: 03/03/2022 02:47   CT HEAD CODE STROKE WO CONTRAST  Result Date: 03/02/2022 CLINICAL DATA:  Code stroke. Initial evaluation for neuro deficit, stroke suspected. EXAM: CT HEAD WITHOUT CONTRAST TECHNIQUE: Contiguous axial images were obtained from the base of the skull through the vertex without intravenous contrast. RADIATION DOSE REDUCTION: This exam was performed according to the departmental dose-optimization program which includes automated exposure control, adjustment of the mA and/or kV according to patient size and/or use  of iterative reconstruction technique. COMPARISON:  None Available. FINDINGS: Brain: Age-related cerebral atrophy with chronic small vessel ischemic disease. No acute intracranial hemorrhage. No acute large vessel territory infarct. No mass lesion or midline shift. No hydrocephalus or extra-axial fluid collection. Vascular: No hyperdense vessel. Calcified atherosclerosis present at skull base. Skull: Scalp soft tissues and calvarium within normal limits. Sinuses/Orbits: Globes and orbital soft tissues demonstrate no acute finding. Scattered mucosal thickening noted within the ethmoidal air cells. Paranasal sinuses are otherwise clear. No mastoid effusion. Other: None. ASPECTS [facility] Stroke Program Early CT Score) - Ganglionic level infarction (caudate, lentiform nuclei, internal capsule, insula, M1-M3 cortex): 7 - Supraganglionic infarction (M4-M6 cortex): 3 Total score (0-10 with 10 being normal): 10 IMPRESSION: 1. No acute intracranial abnormality. 2. ASPECTS is 10. 3. Age-related cerebral atrophy with mild chronic small vessel ischemic disease. These results were communicated to Dr. Theda Sers at 9:51 pm on 03/02/2022 by  text page via the [facility] messaging system. Electronically Signed   By: Jeannine Boga M.D.   On: 03/02/2022 21:51    PHYSICAL EXAM  Physical Exam  Constitutional: Appears well-developed and well-nourished pleasant elderly Caucasian lady.  Cardiovascular: Normal rate and regular rhythm.  Respiratory: Effort normal, non-labored breathing  Neuro: Mental Status: Patient is sitting up in bed and eating breakfast. She is feeding herself independently Patient is awake, alert, oriented to person, place, month, year, and situation. Patient is  not able to give a clear and coherent history.She does remember being in the bathroom and feeling dizzy Positive for mild expressive aphasia with word finding difficulties and paraphasic errors.  Good repetition and slight difficulty with naming.  Good comprehension.  Speech is dysarthric Cranial Nerves: II: Visual Fields are full. Pupils are equal, round, and reactive to light.   III,IV, VI: EOMI without ptosis or diploplia.  V: Facial sensation is symmetric to temperature VII: Slight right lower facial asymmetry on smiling.  VIII: Hearing is intact to voice X: Palate elevates symmetrically XI: Shoulder shrug is symmetric. XII: Tongue protrudes midline without atrophy or fasciculations.  Motor: Tone is normal. Bulk is normal. 5/5 strength was present in all four extremities.  Except diminished fine finger movements on the right and mild right grip weakness and orbits left over right upper extremity. Sensory: Sensation is symmetric to light touch and temperature in the arms and legs. No extinction to DSS present.  Plantars: Toes are downgoing bilaterally.  Cerebellar: FNF and HKS are intact bilaterally   ASSESSMENT/PLAN Tamara Bullock is a 81 y.o. female with history of generalized anxiety, essential hypertension, CKD 3B presenting with right sided facial droop, weakness, and expressive aphasia. Patients Brain MRI revealed acute ischemic  stroke of left basal ganglia most likely due to cardio embolic event. 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 Small vessel disease. MRA Head w/o contrast: negative intracranial large vessel occlusion MRA Neck w/w/o contrast: Technically limited exam due to timing of contrast bolus, mild atherosclerotic change carotid bifurcation/proximal ICA bilaterally greater than 50%   MRI  Brain :acute ischemic stroke left basal ganglia. 2D Echo : Pending LDL 198 HgbA1c 6.0 VTE prophylaxis - SCD    Diet   Diet NPO time specified   aspirin 81 mg daily prior to admission, now on aspirin 81 mg daily and clopidogrel 75 mg daily.x21 days and then stop ASA and continue  with Plavix 75 mg q day indefinitely  Therapy recommendations:  CIR Disposition: Medical Telemetry  Hypertension Home meds:  Resume per primary team  Stable Permissive hypertension (OK if < 220/120) but gradually normalize in 5-7 days Long-term BP goal normotensive  Hyperlipidemia LDL 198, goal < 70 Add  Atorvastatin 80 mg po qhs High intensity statin indicated Continue statin at discharge  Other Stroke Risk Factors Advanced Age >/= 34  Family hx stroke  Hypertension CKD III B    Hospital day # 0  Patient seen and examined by NP/APP with MD. MD to update note as needed.   Janine Ores, DNP, FNP-BC [facility] Pager: [phone]  I have personally obtained history,examined this patient, reviewed notes, independently viewed imaging studies, participated in medical decision making and plan of care.ROS completed by me personally and pertinent positives fully documented  I have made any additions or clarifications directly to the above note. Agree with note above.  Patient has presented with aphasia and right facial weakness  secondary to large left subcortical infarct likely from cryptogenic embolic source with strong suspicion for underlying paroxysmal A-fib.  Recommend check echocardiogram and continue cardiac monitoring.  She may need loop recorder at discharge to look for paroxysmal A-fib.  Mobilize out of bed.  Therapy consults.  Aspirin and Plavix for 3 weeks followed by Plavix alone and aggressive risk factor modification.  Long discussion with the patient and her daughter and son at the bedside and answered questions.  Discussed with Dr. Loleta Books.  Greater than 50% time during this 50-minute visit was spent in counseling and coordination of care about her stroke and aphasia and discussion about evaluation and prevention and treatment and answering questions.  Antony Contras, MD Medical Director [facility] Pager: [phone] 03/03/2022 2:19 PM    To contact Stroke Continuity provider, please refer to [email or web address] After hours, contact General Neurology

## 2022-03-03 NOTE — Progress Notes (Signed)
  Echocardiogram 2D Echocardiogram has been performed.  Delcie Roch 03/03/2022, 10:50 AM

## 2022-03-03 NOTE — Progress Notes (Signed)
Inpatient Rehab Admissions Coordinator Note:   Per PT/OT/ST patient was screened for CIR candidacy by Zakyria Metzinger Luvenia Starch, CCC-SLP. At this time, pt appears to be a potential candidate for CIR. I will place an order for rehab consult for full assessment, per our protocol.  Please contact me any with questions.Wolfgang Phoenix, MS, CCC-SLP Admissions Coordinator [phone] 03/03/22 1:43 PM

## 2022-03-03 NOTE — Evaluation (Signed)
Occupational Therapy Evaluation Patient Details Name: Tamara Bullock MRN: [id number] DOB: [birth year] Today's Date: 03/03/2022   History of Present Illness Tamara Bullock is a 81 y.o. female admitted 6/2  to [facility] ED from home via EMS due to sudden onset expressive aphasia.  Associated with right sided weakness.  Last known well was 03/02/22 around 11 AM.  Per EMS her SBP was >200 on arrival.  Presented outside of window for TPA.  Upon presentation to the ED, she was noted to be aphasic with decrease grip strength on the right.  CT head negative for any acute intracranial findings.  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 PMH:  generalized anxiety, essential hypertension, CKD 3B   Clinical Impression   Patient admitted for the diagnosis above.  PTA patient lives and care for spouse who recently fractured his elbow, and is currently undergoing radiation.  Daughter states family assist with community mobility, but patient cares for her own ADL and iADL at home.  Daughter states patient is [facility] and has baseline cognitive impairment.  Currently she is needing up to +2 for mobility and Max A for ADL completion from sit/stand level.  Deficits impacting independence are listed below.  OT will follow in the acute setting, with AIR recommended for post acute rehab prior to returning home.         Recommendations for follow up therapy are one component of a multi-disciplinary discharge planning process, led by the attending physician.  Recommendations may be updated based on patient status, additional functional criteria and insurance authorization.   Follow Up Recommendations  Acute inpatient rehab (3hours/day)    Assistance Recommended at Discharge Frequent or constant Supervision/Assistance  Patient can return home with the following A lot of help with bathing/dressing/bathroom;A lot of help with walking and/or transfers;Assistance with feeding;Help with stairs or ramp for entrance;Assist for  transportation;Assistance with cooking/housework;Direct supervision/assist for financial management;Direct supervision/assist for medications management    Functional Status Assessment  Patient has had a recent decline in their functional status and demonstrates the ability to make significant improvements in function in a reasonable and predictable amount of time.  Equipment Recommendations  BSC/3in1    Recommendations for Other Services Rehab consult     Precautions / Restrictions Precautions Precautions: Fall Restrictions Weight Bearing Restrictions: No      Mobility Bed Mobility Overal bed mobility: Needs Assistance Bed Mobility: Sit to Supine       Sit to supine: Min guard        Transfers Overall transfer level: Needs assistance Equipment used: 2 person hand held assist Transfers: Sit to/from Stand Sit to Stand: Mod assist                  Balance Overall balance assessment: Needs assistance Sitting-balance support: Feet supported, Bilateral upper extremity supported, No upper extremity supported Sitting balance-Leahy Scale: Poor   Postural control: Right lateral lean, Posterior lean Standing balance support: Bilateral upper extremity supported, During functional activity Standing balance-Leahy Scale: Poor                             ADL either performed or assessed with clinical judgement   ADL       Grooming: Wash/dry hands;Wash/dry face;Minimal assistance;Sitting   Upper Body Bathing: Moderate assistance;Sitting   Lower Body Bathing: Maximal assistance;Sit to/from stand   Upper Body Dressing : Moderate assistance;Sitting   Lower Body Dressing: Maximal assistance;Sit to/from stand  Toilet Transfer: Minimal assistance;+2 for physical assistance                   Vision   Vision Assessment?: No apparent visual deficits     Perception Perception Perception: Within Functional Limits   Praxis Praxis Praxis: Not  tested Praxis Impairment Details: Initiation    Pertinent Vitals/Pain Pain Assessment Pain Assessment: No/denies pain     Hand Dominance Right   Extremity/Trunk Assessment Upper Extremity Assessment Upper Extremity Assessment: RUE deficits/detail;LUE deficits/detail RUE Deficits / Details: 2+/5 should AROM, 3+/5 elbow distal RUE Sensation: decreased light touch RUE Coordination: decreased fine motor;decreased gross motor LUE Sensation: WNL LUE Coordination: decreased fine motor;decreased gross motor   Lower Extremity Assessment Lower Extremity Assessment: Defer to PT evaluation RLE Deficits / Details: grossly 3+/5   Cervical / Trunk Assessment Cervical / Trunk Assessment: Kyphotic   Communication Communication Communication: Expressive difficulties;HOH   Cognition Arousal/Alertness: Awake/alert Behavior During Therapy: Flat affect Overall Cognitive Status: History of cognitive impairments - at baseline Area of Impairment: Safety/judgement, Problem solving                         Safety/Judgement: Decreased awareness of deficits, Decreased awareness of safety   Problem Solving: Requires verbal cues, Slow processing General Comments: family reports that pt has dementia     General Comments  VSS on RA    Exercises     Shoulder Instructions      Home Living Family/patient expects to be discharged to:: Private residence Living Arrangements: Spouse/significant other Available Help at Discharge: Family;Available PRN/intermittently Type of Home: House Home Access: Stairs to enter [employer] of Steps: 5 Entrance Stairs-Rails: Right;Left Home Layout: One level     Bathroom Shower/Tub: Producer, television/film/video: Standard     Home Equipment: Rollator (4 wheels)   Additional Comments: pt helped husband last  weeks or so with B/D and clean him as well as help him get up as he recently broke elbpow aftger jos radoatopm complete.       Prior Functioning/Environment Prior Level of Function : Needs assist;History of Falls (last six months);Driving             Mobility Comments: not using device but was off balance and has fallen per family ADLs Comments: I with B/D, driving locally, but family does not approve.  Caring for husband.        OT Problem List: Decreased strength;Decreased range of motion;Decreased activity tolerance;Impaired balance (sitting and/or standing);Impaired UE functional use;Decreased safety awareness;Decreased cognition;Decreased coordination      OT Treatment/Interventions: Self-care/ADL training;Therapeutic exercise;Therapeutic activities;Cognitive remediation/compensation;Patient/family education;Balance training    OT Goals(Current goals can be found in the care plan section) Acute Rehab OT Goals Patient Stated Goal: Get stronger and go home OT Goal Formulation: With patient Time For Goal Achievement: 03/16/22 Potential to Achieve Goals: Good ADL Goals Pt Will Perform Grooming: with supervision;sitting Pt Will Perform Upper Body Dressing: with supervision;sitting Pt Will Perform Lower Body Dressing: with min assist;sit to/from stand Pt Will Transfer to Toilet: with min assist;ambulating;regular height toilet Pt Will Perform Toileting - Clothing Manipulation and hygiene: with supervision;sitting/lateral leans Pt/caregiver will Perform Home Exercise Program: Increased strength;Both right and left upper extremity;With theraband;With Supervision  OT Frequency: Min 2X/week    Co-evaluation              AM-PAC OT "6 Clicks" Daily Activity     Outcome Measure Help from another person eating  meals?: A Little Help from another person taking care of personal grooming?: A Little Help from another person toileting, which includes using toliet, bedpan, or urinal?: A Lot Help from another person bathing (including washing, rinsing, drying)?: A Lot Help from another person to put on and  taking off regular upper body clothing?: A Lot Help from another person to put on and taking off regular lower body clothing?: A Lot 6 Click Score: 14   End of Session Equipment Utilized During Treatment: Gait belt Nurse Communication: Mobility status  Activity Tolerance: Patient tolerated treatment well Patient left: in bed;with call bell/phone within reach;with family/visitor present  OT Visit Diagnosis: Unsteadiness on feet (R26.81);Other abnormalities of gait and mobility (R26.89);Muscle weakness (generalized) (M62.81);History of falling (Z91.81);Other symptoms and signs involving cognitive function;Hemiplegia and hemiparesis Hemiplegia - Right/Left: Right Hemiplegia - dominant/non-dominant: Dominant Hemiplegia - caused by: Unspecified                Time: 0820-0840 OT Time Calculation (min): 20 min Charges:  OT General Charges $OT Visit: 1 Visit OT Evaluation $OT Eval Moderate Complexity: 1 Mod  03/03/2022  RP, OTR/L  Acute Rehabilitation Services  Office:  [phone]   Suzanna ObeyRichard D Jalani Cullifer 03/03/2022, 9:18 AM

## 2022-03-03 NOTE — Hospital Course (Addendum)
Tamara Bullock is an 81 y.o. F with HTN, depression, CKD IIIa who presented with sudden onset aphasia.  In the ER, SBP >200 mmHg, outside window for tPA, right facial droop and weakness.  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    6/2: Admitted, MRI shows right BG stroke 6/3: Echo shows new EF 30-35%, Neurology suspect embolism, recommend ILR prior to d/c; PT recommend CIR 6.4: Cardiology consulted for reduced EF

## 2022-03-03 NOTE — H&P (Addendum)
History and Physical  Tamara Bullock [id number] DOB: 1941/09/28 DOA: 03/02/2022  Referring physician: Dr. Ralene Bathe, [place] PCP: Pcp, No  Outpatient Specialists: None. Patient coming from: Home.  Chief Complaint: Sudden onset expressive aphasia and right sided weakness.  HPI: Tamara Bullock is a 81 y.o. female with medical history significant for generalized anxiety, essential hypertension, CKD 3B, who presented to [facility] ED from home via EMS due to sudden onset expressive aphasia.  Associated with right sided weakness.  Last known well was 03/02/22 around 11 AM.  Per EMS her SBP was >200 on arrival.  Presented outside of window for TPA.  Upon presentation to the ED, she was noted to be aphasic with decrease grip strength on the right.  CT head negative for any acute intracranial findings.  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  Seen by neurology/stroke team.  Recommended admission for stroke work-up.  TRH, hospitalist team, was asked to admit.  ED Course: Tmax 98.2.  BP 105/61 pulse 58.  Respiratory 24, with saturation 92 to 97% on room air.  Lab studies significant for BUN 29, creatinine 1.20, GFR 40.  CBC essentially unremarkable.  Review of Systems: Review of systems as noted in the HPI. All other systems reviewed and are negative.  Past medical history: Essential hypertension Generalized anxiety.  Social History:  has no history on file for tobacco use, alcohol use, and drug use.   Allergies  Allergen Reactions   Penicillin G Hives   Sulfa Antibiotics Hives   Aciphex [Rabeprazole]    Amitriptyline    Aspirin    Azithromycin    Carbocaine [Mepivacaine]    Cimetidine    Ciprofloxacin    Cisapride    Clindamycin/Lincomycin    Cortisporin-Tc [Neomycin-Colist-Hc-Thonzonium]    Escitalopram    Hydrochlorothiazide    Hydrocodone    Ibuprofen    Iodine    Macrodantin [Nitrofurantoin]    Medroxyprogesterone    Meloxicam    Minocycline    Moxifloxacin     Chest pain     Nitrofuran Derivatives    Nsaids    Ofloxacin    Pantoprazole    Pneumococcal Vaccines    Prednisolone    Prednisone    Prevacid [Lansoprazole]    Propoxyphene    Soap    Spironolactone    Tobradex [Tobramycin-Dexamethasone]    Tramadol    Trolamine Salicylate    Verapamil    Voltaren [Diclofenac]     Family history: Mother with history of stroke Father with history of heart attack  Prior to Admission medications   Medication Sig Start Date End Date Taking? Authorizing Provider  aMILoride (MIDAMOR) 5 MG tablet Take 10 mg by mouth daily. 01/22/22  Yes [provider]  Calcium Carb-Cholecalciferol 600-10 MG-MCG TABS Take 1 tablet by mouth daily.   Yes [provider]  carvedilol (COREG) 12.5 MG tablet Take 12.5 mg by mouth 2 (two) times daily. 12/25/21  Yes [provider]  Cholecalciferol 25 MCG (1000 UT) tablet Take 1,000 Units by mouth daily.   Yes [provider]  cloNIDine (CATAPRES - DOSED IN MG/24 HR) 0.2 mg/24hr patch Place 0.2 mg onto the skin once a week. 02/12/22  Yes [provider]  EUTHYROX 50 MCG tablet Take 50 mcg by mouth daily. 02/19/22  Yes [provider]  LORazepam (ATIVAN) 0.5 MG tablet Take 0.5 mg by mouth daily as needed for anxiety. 02/21/22  Yes [provider]  losartan (COZAAR) 50 MG tablet Take  50 mg by mouth daily. 10/09/21  Yes [provider]  mirtazapine (REMERON) 7.5 MG tablet Take 7.5 mg by mouth at bedtime. 02/25/22  Yes [provider]  Oxcarbazepine (TRILEPTAL) 300 MG tablet Take 450 mg by mouth daily. 11/01/21  Yes [provider]  potassium chloride (KLOR-CON) 10 MEQ tablet Take 10 mEq by mouth every other day. 02/09/22  Yes [provider]  sertraline (ZOLOFT) 25 MG tablet Take 25 mg by mouth daily. 02/12/22  Yes [provider]  Study - CAPTIVA - aspirin 81 mg tablet (PI-Sethi) Chew 1 tablet by mouth daily.   Yes [provider]   torsemide (DEMADEX) 20 MG tablet Take 40 mg by mouth daily. 12/24/21  Yes [provider]    Physical Exam: BP (!) 117/54   Pulse 61   Temp 98.2 F (36.8 C) (Oral)   Resp 17   SpO2 97%   General: 81 y.o. year-old female well developed well nourished in no acute distress.  Alert and oriented x3.  Expressive aphasia noted. Cardiovascular: Regular rate and rhythm with no rubs or gallops.  No thyromegaly or JVD noted.  No lower extremity edema. 2/4 pulses in all 4 extremities. Respiratory: Clear to auscultation with no wheezes or rales. Good inspiratory effort. Abdomen: Soft nontender nondistended with normal bowel sounds x4 quadrants. Muskuloskeletal: No cyanosis, clubbing or edema noted bilaterally Neuro: Decreased right grip strength. Skin: No ulcerative lesions noted or rashes Psychiatry: Judgement and insight appear normal. Mood is appropriate for condition and setting          Labs on Admission:  Basic Metabolic Panel: Recent Labs  Lab 03/02/22 2157 03/02/22 2207  NA 136 135  K 3.7 3.7  CL 98 98  CO2 26  --   GLUCOSE 123* 118*  BUN 28* 29*  CREATININE 1.14* 1.20*  CALCIUM 9.7  --    Liver Function Tests: Recent Labs  Lab 03/02/22 2157  AST 30  ALT 17  ALKPHOS 74  BILITOT 0.4  PROT 7.2  ALBUMIN 4.4   No results for input(s): LIPASE, AMYLASE in the last 168 hours. No results for input(s): AMMONIA in the last 168 hours. CBC: Recent Labs  Lab 03/02/22 2157 03/02/22 2207  WBC 6.5  --   NEUTROABS 3.4  --   HGB 12.2 12.6  HCT 35.8* 37.0  MCV 89.9  --   PLT 290  --    Cardiac Enzymes: No results for input(s): CKTOTAL, CKMB, CKMBINDEX, TROPONINI in the last 168 hours.  BNP (last 3 results) No results for input(s): BNP in the last 8760 hours.  ProBNP (last 3 results) No results for input(s): PROBNP in the last 8760 hours.  CBG: No results for input(s): GLUCAP in the last 168 hours.  Radiological Exams on Admission: MR ANGIO HEAD WO  CONTRAST  Result Date: 03/03/2022 CLINICAL DATA:  Initial evaluation for neuro deficit, stroke suspected. EXAM: MRI HEAD WITHOUT CONTRAST MRA HEAD WITHOUT CONTRAST MRA NECK WITHOUT AND WITH CONTRAST TECHNIQUE: Multiplanar, multi-echo pulse sequences of the brain and surrounding structures were acquired without intravenous contrast. Angiographic images of the Circle of Willis were acquired using MRA technique without intravenous contrast. Angiographic images of the neck were acquired using MRA technique without and with intravenous contrast. Carotid stenosis measurements (when applicable) are obtained utilizing NASCET criteria, using the distal internal carotid diameter as the denominator. CONTRAST:  Please see contrast documentation. COMPARISON:  Prior head CT from 03/02/2022. FINDINGS: MRI HEAD FINDINGS Brain: Generalized age-related cerebral  volume loss. Scattered patchy T2/FLAIR hyperintensity involving the periventricular deep white matter both cerebral hemispheres, most like related chronic microvascular ischemic disease, mild for age. 2.5 cm curvilinear ischemic infarcts seen involving the left basal ganglia. No associated hemorrhage or mass effect. No other evidence for acute or subacute ischemia. No other areas of chronic cortical infarction. No acute or chronic intracranial blood products. No mass lesion, midline shift or mass effect. No hydrocephalus or extra-axial fluid collection. Partially empty sella noted. Vascular: Major intracranial vascular flow voids are maintained. Skull and upper cervical spine: Cranial junction within normal limits. Bone marrow signal intensity within normal limits. No scalp soft tissue abnormality. Sinuses/Orbits: Prior bilateral ocular lens replacement. Scattered mucosal thickening noted about the ethmoidal air cells. Paranasal sinuses are otherwise clear. No significant mastoid effusion. Inner ear structures grossly normal. Other: None. MRA HEAD FINDINGS Anterior  circulation: Examination mildly degraded by motion. Both internal carotid arteries patent to the termini without stenosis or other abnormality. A1 segments patent. Normal anterior communicating artery complex. Anterior cerebral arteries patent without significant stenosis. No M1 stenosis or occlusion. No proximal MCA branch occlusion. Distal MCA branches perfused and symmetric. Mild distal small vessel atheromatous irregularity. Posterior circulation: Both V4 segments patent without stenosis. Right vertebral artery slightly dominant. Partially visualized PICA patent bilaterally. Basilar patent to its distal aspect without stenosis. Superior cerebellar and posterior cerebral arteries patent bilaterally. Anatomic variants: None significant.  No aneurysm. MRA NECK FINDINGS Aortic arch: Examination technically limited due to timing of the contrast bolus, with no contrast seen within the arterial system. Aortic arch and origin of the great vessels not seen or assessed on this exam. Right carotid system: Visualized right CCA patent without stenosis. Mild atheromatous irregularity about the right carotid bulb/proximal right ICA without hemodynamically greater than 50% stenosis. Right ICA patent distally without stenosis or evidence for dissection. Left carotid system: Eccentric plaque noted within the mid-distal left CCA without significant stenosis. Mild for age atheromatous irregularity about the left carotid bulb/proximal left ICA without hemodynamically significant greater than 50% stenosis. Left ICA patent distally without stenosis or evidence for dissection. Vertebral arteries: Both vertebral arteries appear to arise from the subclavian arteries. No visible proximal subclavian artery stenosis. Vertebral arteries patent within the neck without visible stenosis or evidence for dissection. Other: None IMPRESSION: MRI HEAD: 1. 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 2. Underlying age-related  cerebral volume loss with mild chronic microvascular ischemic disease. MRA HEAD: 1. Negative intracranial MRA for large vessel occlusion. No hemodynamically significant or correctable stenosis. 2. Mild distal small vessel atheromatous irregularity. MRA NECK: 1. Technically limited exam due to timing of the contrast bolus, with no contrast seen within the arterial system of the neck. 2. Mild atherosclerotic change about the carotid bifurcations/proximal ICAs bilaterally without hemodynamically significant greater than 50% stenosis. 3. Patent vertebral arteries within the neck without significant stenosis or other acute finding. Electronically Signed   By: Rise MuBenjamin  McClintock M.D.   On: 03/03/2022 02:47   MR Angiogram Neck W or Wo Contrast  Result Date: 03/03/2022 CLINICAL DATA:  Initial evaluation for neuro deficit, stroke suspected. EXAM: MRI HEAD WITHOUT CONTRAST MRA HEAD WITHOUT CONTRAST MRA NECK WITHOUT AND WITH CONTRAST TECHNIQUE: Multiplanar, multi-echo pulse sequences of the brain and surrounding structures were acquired without intravenous contrast. Angiographic images of the Circle of Willis were acquired using MRA technique without intravenous contrast. Angiographic images of the neck were acquired using MRA technique without and with intravenous contrast. Carotid stenosis measurements (when  applicable) are obtained utilizing NASCET criteria, using the distal internal carotid diameter as the denominator. CONTRAST:  Please see contrast documentation. COMPARISON:  Prior head CT from 03/02/2022. FINDINGS: MRI HEAD FINDINGS Brain: Generalized age-related cerebral volume loss. Scattered patchy T2/FLAIR hyperintensity involving the periventricular deep white matter both cerebral hemispheres, most like related chronic microvascular ischemic disease, mild for age. 2.5 cm curvilinear ischemic infarcts seen involving the left basal ganglia. No associated hemorrhage or mass effect. No other evidence for acute or  subacute ischemia. No other areas of chronic cortical infarction. No acute or chronic intracranial blood products. No mass lesion, midline shift or mass effect. No hydrocephalus or extra-axial fluid collection. Partially empty sella noted. Vascular: Major intracranial vascular flow voids are maintained. Skull and upper cervical spine: Cranial junction within normal limits. Bone marrow signal intensity within normal limits. No scalp soft tissue abnormality. Sinuses/Orbits: Prior bilateral ocular lens replacement. Scattered mucosal thickening noted about the ethmoidal air cells. Paranasal sinuses are otherwise clear. No significant mastoid effusion. Inner ear structures grossly normal. Other: None. MRA HEAD FINDINGS Anterior circulation: Examination mildly degraded by motion. Both internal carotid arteries patent to the termini without stenosis or other abnormality. A1 segments patent. Normal anterior communicating artery complex. Anterior cerebral arteries patent without significant stenosis. No M1 stenosis or occlusion. No proximal MCA branch occlusion. Distal MCA branches perfused and symmetric. Mild distal small vessel atheromatous irregularity. Posterior circulation: Both V4 segments patent without stenosis. Right vertebral artery slightly dominant. Partially visualized PICA patent bilaterally. Basilar patent to its distal aspect without stenosis. Superior cerebellar and posterior cerebral arteries patent bilaterally. Anatomic variants: None significant.  No aneurysm. MRA NECK FINDINGS Aortic arch: Examination technically limited due to timing of the contrast bolus, with no contrast seen within the arterial system. Aortic arch and origin of the great vessels not seen or assessed on this exam. Right carotid system: Visualized right CCA patent without stenosis. Mild atheromatous irregularity about the right carotid bulb/proximal right ICA without hemodynamically greater than 50% stenosis. Right ICA patent distally  without stenosis or evidence for dissection. Left carotid system: Eccentric plaque noted within the mid-distal left CCA without significant stenosis. Mild for age atheromatous irregularity about the left carotid bulb/proximal left ICA without hemodynamically significant greater than 50% stenosis. Left ICA patent distally without stenosis or evidence for dissection. Vertebral arteries: Both vertebral arteries appear to arise from the subclavian arteries. No visible proximal subclavian artery stenosis. Vertebral arteries patent within the neck without visible stenosis or evidence for dissection. Other: None IMPRESSION: MRI HEAD: 1. 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 2. Underlying age-related cerebral volume loss with mild chronic microvascular ischemic disease. MRA HEAD: 1. Negative intracranial MRA for large vessel occlusion. No hemodynamically significant or correctable stenosis. 2. Mild distal small vessel atheromatous irregularity. MRA NECK: 1. Technically limited exam due to timing of the contrast bolus, with no contrast seen within the arterial system of the neck. 2. Mild atherosclerotic change about the carotid bifurcations/proximal ICAs bilaterally without hemodynamically significant greater than 50% stenosis. 3. Patent vertebral arteries within the neck without significant stenosis or other acute finding. Electronically Signed   By: Jeannine Boga M.D.   On: 03/03/2022 02:47   MR BRAIN WO CONTRAST  Result Date: 03/03/2022 CLINICAL DATA:  Initial evaluation for neuro deficit, stroke suspected. EXAM: MRI HEAD WITHOUT CONTRAST MRA HEAD WITHOUT CONTRAST MRA NECK WITHOUT AND WITH CONTRAST TECHNIQUE: Multiplanar, multi-echo pulse sequences of the brain and surrounding structures were acquired without intravenous contrast. Angiographic  images of the Circle of Willis were acquired using MRA technique without intravenous contrast. Angiographic images of the neck were acquired  using MRA technique without and with intravenous contrast. Carotid stenosis measurements (when applicable) are obtained utilizing NASCET criteria, using the distal internal carotid diameter as the denominator. CONTRAST:  Please see contrast documentation. COMPARISON:  Prior head CT from 03/02/2022. FINDINGS: MRI HEAD FINDINGS Brain: Generalized age-related cerebral volume loss. Scattered patchy T2/FLAIR hyperintensity involving the periventricular deep white matter both cerebral hemispheres, most like related chronic microvascular ischemic disease, mild for age. 2.5 cm curvilinear ischemic infarcts seen involving the left basal ganglia. No associated hemorrhage or mass effect. No other evidence for acute or subacute ischemia. No other areas of chronic cortical infarction. No acute or chronic intracranial blood products. No mass lesion, midline shift or mass effect. No hydrocephalus or extra-axial fluid collection. Partially empty sella noted. Vascular: Major intracranial vascular flow voids are maintained. Skull and upper cervical spine: Cranial junction within normal limits. Bone marrow signal intensity within normal limits. No scalp soft tissue abnormality. Sinuses/Orbits: Prior bilateral ocular lens replacement. Scattered mucosal thickening noted about the ethmoidal air cells. Paranasal sinuses are otherwise clear. No significant mastoid effusion. Inner ear structures grossly normal. Other: None. MRA HEAD FINDINGS Anterior circulation: Examination mildly degraded by motion. Both internal carotid arteries patent to the termini without stenosis or other abnormality. A1 segments patent. Normal anterior communicating artery complex. Anterior cerebral arteries patent without significant stenosis. No M1 stenosis or occlusion. No proximal MCA branch occlusion. Distal MCA branches perfused and symmetric. Mild distal small vessel atheromatous irregularity. Posterior circulation: Both V4 segments patent without stenosis.  Right vertebral artery slightly dominant. Partially visualized PICA patent bilaterally. Basilar patent to its distal aspect without stenosis. Superior cerebellar and posterior cerebral arteries patent bilaterally. Anatomic variants: None significant.  No aneurysm. MRA NECK FINDINGS Aortic arch: Examination technically limited due to timing of the contrast bolus, with no contrast seen within the arterial system. Aortic arch and origin of the great vessels not seen or assessed on this exam. Right carotid system: Visualized right CCA patent without stenosis. Mild atheromatous irregularity about the right carotid bulb/proximal right ICA without hemodynamically greater than 50% stenosis. Right ICA patent distally without stenosis or evidence for dissection. Left carotid system: Eccentric plaque noted within the mid-distal left CCA without significant stenosis. Mild for age atheromatous irregularity about the left carotid bulb/proximal left ICA without hemodynamically significant greater than 50% stenosis. Left ICA patent distally without stenosis or evidence for dissection. Vertebral arteries: Both vertebral arteries appear to arise from the subclavian arteries. No visible proximal subclavian artery stenosis. Vertebral arteries patent within the neck without visible stenosis or evidence for dissection. Other: None IMPRESSION: MRI HEAD: 1. 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 2. Underlying age-related cerebral volume loss with mild chronic microvascular ischemic disease. MRA HEAD: 1. Negative intracranial MRA for large vessel occlusion. No hemodynamically significant or correctable stenosis. 2. Mild distal small vessel atheromatous irregularity. MRA NECK: 1. Technically limited exam due to timing of the contrast bolus, with no contrast seen within the arterial system of the neck. 2. Mild atherosclerotic change about the carotid bifurcations/proximal ICAs bilaterally without hemodynamically  significant greater than 50% stenosis. 3. Patent vertebral arteries within the neck without significant stenosis or other acute finding. Electronically Signed   By: Jeannine Boga M.D.   On: 03/03/2022 02:47   CT HEAD CODE STROKE WO CONTRAST  Result Date: 03/02/2022 CLINICAL DATA:  Code stroke. Initial  evaluation for neuro deficit, stroke suspected. EXAM: CT HEAD WITHOUT CONTRAST TECHNIQUE: Contiguous axial images were obtained from the base of the skull through the vertex without intravenous contrast. RADIATION DOSE REDUCTION: This exam was performed according to the departmental dose-optimization program which includes automated exposure control, adjustment of the mA and/or kV according to patient size and/or use of iterative reconstruction technique. COMPARISON:  None Available. FINDINGS: Brain: Age-related cerebral atrophy with chronic small vessel ischemic disease. No acute intracranial hemorrhage. No acute large vessel territory infarct. No mass lesion or midline shift. No hydrocephalus or extra-axial fluid collection. Vascular: No hyperdense vessel. Calcified atherosclerosis present at skull base. Skull: Scalp soft tissues and calvarium within normal limits. Sinuses/Orbits: Globes and orbital soft tissues demonstrate no acute finding. Scattered mucosal thickening noted within the ethmoidal air cells. Paranasal sinuses are otherwise clear. No mastoid effusion. Other: None. ASPECTS [facility] Stroke Program Early CT Score) - Ganglionic level infarction (caudate, lentiform nuclei, internal capsule, insula, M1-M3 cortex): 7 - Supraganglionic infarction (M4-M6 cortex): 3 Total score (0-10 with 10 being normal): 10 IMPRESSION: 1. No acute intracranial abnormality. 2. ASPECTS is 10. 3. Age-related cerebral atrophy with mild chronic small vessel ischemic disease. These results were communicated to Dr. Theda Sers at 9:51 pm on 03/02/2022 by text page via the [facility] messaging system. Electronically Signed   By:  Jeannine Boga M.D.   On: 03/02/2022 21:51    EKG: I independently viewed the EKG done and my findings are as followed: Sinus rhythm rate of 66.  Nonspecific ST-T changes. QTc 479.  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 Last known well 03/02/22, 11 AM. Presented outside window for tPA Admitted for stroke work-up MRI brain findings as stated above MRA head and neck, no large vessel occlusion, no significant stenosis or other acute findings. Obtain 2D echo with bubble study Lipid panel/hemoglobin A1c PT/OT/speech therapy evaluation Frequent neuro checks Permissive hypertension, treat SBP greater than 220 or DBP greater than 120 Fall/aspiration/delirium precautions. ASA 81 mg daily plus Plavix x 3 weeks High intensity statin, Lipitor 80 mg daily  Essential hypertension Ongoing permissive hypertension Hold home oral antihypertensives Treat SBP greater than 220 or DBP greater than 120 Continue close monitoring of vital signs  CKD 3A Appears to be at baseline Gentle IV fluid hydration NS 50cc/hr x 1 day Avoid nephrotoxic meds and hypotension NPO until passes swallow evaluation  Chronic anxiety/depression Resume home regimen  Hypothyroidism Resume home regimen  Acute urinary retention Bladder scan In and out cath prn.    DVT prophylaxis: Subcu Lovenox daily  Code Status: Full code  Family Communication: Daughter and son-in-law at bedside  Disposition Plan: Admitted to telemetry medical unit  Consults called: Neurology/stroke team  Admission status: Observation   Status is: Observation    Kayleen Memos MD [facility] Pager [phone]  If 7PM-7AM, please contact night-coverage [email or web address] Password [facility]  03/03/2022, 3:43 AM

## 2022-03-03 NOTE — ED Notes (Signed)
Patient to MRI.

## 2022-03-03 NOTE — Evaluation (Signed)
Speech Language Pathology Evaluation Patient Details Name: LEN QUINONES MRN: [id number] DOB: 10-09-1940 Today's Date: 03/03/2022 Time: [id number] SLP Time Calculation (min) (ACUTE ONLY): 27 min  Problem List:  Patient Active Problem List   Diagnosis Date Noted   Acute ischemic stroke ([place]) 03/03/2022   Essential hypertension 03/03/2022   Stage 3a chronic kidney disease (CKD) ([place]) 03/03/2022   Depression 03/03/2022   Hypothyroidism 03/03/2022   Acute urinary retention 03/03/2022   Stroke ([place]) 03/03/2022   Past Medical History: No past medical history on file.  HPI:  ESPERANSA LOUT is a 81 y.o. female who presented to [facility] ED from home via EMS due to sudden onset expressive aphasia and  right sided weakness.  Prior medical history significant for generalized anxiety, essential hypertension.  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   Assessment / Plan / Recommendation Clinical Impression  Ms. Roehrig presents with a mild aphasia and mild-moderate dysarthria of speech. She was oriented x4; demonstrated mild difficulty with naming to confrontation and responsive naming.  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 Output was fluent, low-volume, with delayed responses and reduced initiation/effort overall.  Comprehension of basic questions/commands intact; had difficulty following multi step commands.  Per daughter and son at bedside, Ms. Perlow's cognition/memory have deteriorated in the last year.  She tested well on a screen for dementia,  but functionally they are seeing memory changes at home.  She has been caregiver for her husband. Recommend AIR consult - pt would benefit from intensive speech/language therapy to improve communication prior to D/C home.    SLP Assessment  SLP Recommendation/Assessment: Patient needs continued Speech Lanaguage Pathology Services SLP Visit Diagnosis: Aphasia (R47.01);Dysarthria and anarthria (R47.1)    Recommendations for  follow up therapy are one component of a multi-disciplinary discharge planning process, led by the attending physician.  Recommendations may be updated based on patient status, additional functional criteria and insurance authorization.    Follow Up Recommendations  Acute inpatient rehab (3hours/day)    Assistance Recommended at Discharge  Intermittent Supervision/Assistance  Functional Status Assessment Patient has had a recent decline in their functional status and demonstrates the ability to make significant improvements in function in a reasonable and predictable amount of time.  Frequency and Duration min 2x/week  2 weeks      SLP Evaluation Cognition  Overall Cognitive Status: History of cognitive impairments - at baseline Arousal/Alertness: Awake/alert Orientation Level: Oriented X4 Attention: Sustained Sustained Attention: Appears intact Awareness: Impaired Awareness Impairment: Intellectual impairment       Comprehension  Auditory Comprehension Overall Auditory Comprehension: Impaired Yes/No Questions: Within Functional Limits Commands: Impaired Complex Commands: 50-74% accurate Reading Comprehension Reading Status: Not tested    Expression Expression Primary Mode of Expression: Verbal Verbal Expression Overall Verbal Expression: Impaired Initiation: Impaired Level of Generative/Spontaneous Verbalization: Sentence Repetition: Impaired Level of Impairment: Sentence level Naming: Impairment Responsive: 76-100% accurate Confrontation: Impaired Convergent: 50-74% accurate Verbal Errors: Phonemic paraphasias Written Expression Dominant Hand: Right   Oral / Motor  Oral Motor/Sensory Function Overall Oral Motor/Sensory Function: Mild impairment Facial Strength: Reduced right Lingual ROM: Other (Comment) (symmetric but reduced extension) Lingual Symmetry: Other (Comment) (reduced extension) Lingual Strength: Reduced Motor Speech Overall Motor Speech:  Impaired Phonation: Low vocal intensity Resonance: Within functional limits Articulation: Impaired Level of Impairment: Word Intelligibility: Intelligibility reduced Word: 75-100% accurate Phrase: 75-100% accurate Sentence: 50-74% accurate Motor Planning: Witnin functional limits            [name]  03/03/2022, 10:00 AM  Lunell Robart L. Tivis Ringer, [place] Office number [phone] Pager [phone]

## 2022-03-04 ENCOUNTER — Encounter (HOSPITAL_COMMUNITY): Payer: Self-pay | Admitting: Internal Medicine

## 2022-03-04 ENCOUNTER — Other Ambulatory Visit: Payer: Self-pay | Admitting: Student

## 2022-03-04 DIAGNOSIS — I639 Cerebral infarction, unspecified: Secondary | ICD-10-CM | POA: Diagnosis not present

## 2022-03-04 DIAGNOSIS — I5022 Chronic systolic (congestive) heart failure: Secondary | ICD-10-CM | POA: Diagnosis not present

## 2022-03-04 DIAGNOSIS — R338 Other retention of urine: Secondary | ICD-10-CM

## 2022-03-04 DIAGNOSIS — R413 Other amnesia: Secondary | ICD-10-CM

## 2022-03-04 DIAGNOSIS — I10 Essential (primary) hypertension: Secondary | ICD-10-CM

## 2022-03-04 DIAGNOSIS — I429 Cardiomyopathy, unspecified: Secondary | ICD-10-CM

## 2022-03-04 DIAGNOSIS — E039 Hypothyroidism, unspecified: Secondary | ICD-10-CM

## 2022-03-04 DIAGNOSIS — F32A Depression, unspecified: Secondary | ICD-10-CM

## 2022-03-04 DIAGNOSIS — I5042 Chronic combined systolic (congestive) and diastolic (congestive) heart failure: Secondary | ICD-10-CM | POA: Diagnosis present

## 2022-03-04 DIAGNOSIS — N1831 Chronic kidney disease, stage 3a: Secondary | ICD-10-CM

## 2022-03-04 MED ORDER — LOSARTAN POTASSIUM 25 MG PO TABS
12.5000 mg | ORAL_TABLET | Freq: Every day | ORAL | Status: DC
  Administered 2022-03-04: 12.5 mg via ORAL
  Filled 2022-03-04: qty 1

## 2022-03-04 MED ORDER — SERTRALINE HCL 50 MG PO TABS
25.0000 mg | ORAL_TABLET | Freq: Every day | ORAL | Status: DC
  Administered 2022-03-05 – 2022-03-07 (×3): 25 mg via ORAL
  Filled 2022-03-04 (×3): qty 1

## 2022-03-04 MED ORDER — MIRTAZAPINE 15 MG PO TABS
7.5000 mg | ORAL_TABLET | Freq: Every day | ORAL | Status: DC
  Administered 2022-03-04 – 2022-03-06 (×3): 7.5 mg via ORAL
  Filled 2022-03-04 (×3): qty 1

## 2022-03-04 NOTE — Assessment & Plan Note (Addendum)
Non-invasive angiography showed <50% carotids and no significant intracranial stenoses  Echocardiogram showed no cardiogenic source of embolism, note reduced EF  Lipids ordered, aspirin ordered at admission, Afib suspected but none observed, will plan for ILR, tPA not given because presented outside window, dysphagia screen ordered in ER, PT ordered, nonsmoker.  - Continue aspirin, Plavix and atorvastatin - Plan for DAPT for 3 weeks at discharge then Plavix alone indefinitely

## 2022-03-04 NOTE — Assessment & Plan Note (Addendum)
BP controlled   - Continue torsemide, carvedilol, losartan - Resume amiloride at d/c to SNF - Hold home amiloride  - Stop clonidine

## 2022-03-04 NOTE — Assessment & Plan Note (Signed)
Patient recently moved to [facility] from [place] to be closer to family due to concerns from family about short term memory loss (getting lost driving, repetitive questions).  Evidently, patient screened with MMSE by PCP and seemed normal, I see no records of neuropsychiatric testing (although family believe she had this).  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

## 2022-03-04 NOTE — Consult Note (Signed)
Cardiology Consultation:   Patient ID: Tamara Bullock MRN: [id number]; DOB: 11-16-1940  Admit date: 03/02/2022 Date of Consult: 03/04/2022  PCP:  Pcp, No   [facility] Providers Cardiologist: Previously followed by [facility]; New to [facility]   Patient Profile:   Tamara Bullock is a 81 y.o. 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 Loleta Books.  History of Present Illness:   Ms. Tamara Bullock presented to [facility] ED on 03/02/2022 for evaluation of right-sided weakness, slurred speech and facial droop. Upon EMS arrival, she was significantly hypertensive with SBP > 200. Was outside the window for TPA. Initial labs showed WBC 6.5, Hgb 12.2, platelets 290, Na+ 136, K+ 3.7, creatinine 1.14. AST 30 and ALT 17. Hgb A1c 6.0. FLP shows total cholesterol of 278, triglycerides 112, HDL 58 and LDL 198. EKG shows NSR, HR 66 with anterior infarct pattern with TWI along AVL. CT Head showed no acute intracranial abnormalities. 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   Neurology was consulted and has recommended ASA 81mg  daily and Plavix 75mg  daily for 21 days followed by Plavix alone. She has been started on Atorvastatin 80mg  daily given her LDL of 198. Neurology had previously reached out to Cardiology about an ILR prior to discharge.   An echo was performed as part of the CVA work-up and showed a reduced EF of 30-35% with global HK. RV function was normal with only trivial MR and no significant valve abnormalities.   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 Has difficulty with finding the appropriate words to use. Denies any current chest pain. Says her breathing has been stable. No specific  orthopnea, PND or pitting edema. Unaware of any prior cardiac history (had prior stress testing  and echocardiogram at [facility] in 07/2021 with normal EF at that time and NST showing no ischemia).    Past Medical History:  Diagnosis Date   Essential hypertension    Hyperlipidemia     History reviewed. No pertinent surgical history.   Home Medications:  Prior to Admission medications   Medication Sig Start Date End Date Taking? Authorizing Provider  aMILoride (MIDAMOR) 5 MG tablet Take 10 mg by mouth daily. 01/22/22  Yes [provider]  Calcium Carb-Cholecalciferol 600-10 MG-MCG TABS Take 1 tablet by mouth daily.   Yes [provider]  carvedilol (COREG) 12.5 MG tablet Take 12.5 mg by mouth 2 (two) times daily. 12/25/21  Yes [provider]  Cholecalciferol 25 MCG (1000 UT) tablet Take 1,000 Units by mouth daily.   Yes [provider]  cloNIDine (CATAPRES - DOSED IN MG/24 HR) 0.2 mg/24hr patch Place 0.2 mg onto the skin once a week. 02/12/22  Yes [provider]  EUTHYROX 50 MCG tablet Take 50 mcg by mouth daily. 02/19/22  Yes [provider]  LORazepam (ATIVAN) 0.5 MG tablet Take 0.5 mg by mouth daily as needed for anxiety. 02/21/22  Yes [provider]  losartan (COZAAR) 50 MG tablet Take 50 mg by mouth daily. 10/09/21  Yes [provider]  mirtazapine (REMERON) 7.5 MG tablet Take 7.5 mg by mouth at bedtime. 02/25/22  Yes [provider]  Oxcarbazepine (TRILEPTAL) 300 MG tablet  Take 450 mg by mouth daily. 11/01/21  Yes [provider]  potassium chloride (KLOR-CON) 10 MEQ tablet Take 10 mEq by mouth every other day. 02/09/22  Yes [provider]  sertraline (ZOLOFT) 25 MG tablet Take 25 mg by mouth daily. 02/12/22  Yes [provider]  Study - CAPTIVA - aspirin 81 mg tablet (PI-Sethi) Chew 1 tablet by mouth daily.   Yes [provider]  torsemide (DEMADEX) 20 MG tablet Take 40 mg by  mouth daily. 12/24/21  Yes [provider]    Inpatient Medications: Scheduled Meds:  aspirin EC  81 mg Oral Daily   atorvastatin  80 mg Oral Daily   clopidogrel  75 mg Oral Daily   enoxaparin (LOVENOX) injection  40 mg Subcutaneous Daily   levothyroxine  50 mcg Oral Daily   Continuous Infusions:  PRN Meds: labetalol, LORazepam  Allergies:    Allergies  Allergen Reactions   Penicillin G Hives   Sulfa Antibiotics Hives   Aciphex [Rabeprazole]    Amitriptyline    Aspirin    Azithromycin    Carbocaine [Mepivacaine]    Cimetidine    Ciprofloxacin    Cisapride    Clindamycin/Lincomycin    Cortisporin-Tc [Neomycin-Colist-Hc-Thonzonium]    Escitalopram    Hydrochlorothiazide    Hydrocodone    Ibuprofen    Iodine    Macrodantin [Nitrofurantoin]    Medroxyprogesterone    Meloxicam    Minocycline    Moxifloxacin     Chest pain    Nitrofuran Derivatives    Nsaids    Ofloxacin    Pantoprazole    Pneumococcal Vaccines    Prednisolone    Prednisone    Prevacid [Lansoprazole]    Propoxyphene    Soap    Spironolactone    Tobradex [Tobramycin-Dexamethasone]    Tramadol    Trolamine Salicylate    Verapamil    Voltaren [Diclofenac]     Social History:   Social History   Socioeconomic History   Marital status: Married    Spouse name: Not on file   Number of children: Not on file   Years of education: Not on file   Highest education level: Not on file  Occupational History   Not on file  Tobacco Use   Smoking status: Never    Passive exposure: Never   Smokeless tobacco: Never  Substance and Sexual Activity   Alcohol use: Not on file   Drug use: Not on file   Sexual activity: Not on file  Other Topics Concern   Not on file  Social History Narrative   Not on file   Social Determinants of Health   Financial Resource Strain: Not on file  Food Insecurity: Not on file  Transportation Needs: Not on file  Physical Activity: Not on file  Stress: Not  on file  Social Connections: Not on file  Intimate Partner Violence: Not on file    Family History:    Family History  Problem Relation Age of Onset   Stroke Mother    Heart attack Father      ROS:  Please see the history of present illness.   All other ROS reviewed and negative.     Physical Exam/Data:   Vitals:   03/03/22 2300 03/04/22 0000 03/04/22 0400 03/04/22 0500  BP: 140/63 (!) 122/54 128/74   Pulse: 63 (!) 58    Resp: 20 (!) 21 20   Temp: 98.2 F (36.8 C) 98 F (36.7 C) 98.5  F (36.9 C)   TempSrc: Oral  Oral   SpO2: 96% 96% 99%   Weight:    63.1 kg    Intake/Output Summary (Last 24 hours) at 03/04/2022 1051 Last data filed at 03/04/2022 0906 Gross per 24 hour  Intake 645.84 ml  Output 925 ml  Net -279.16 ml      03/04/2022    5:00 AM  Last 3 Weights  Weight (lbs) 139 lb 1.8 oz  Weight (kg) 63.1 kg     There is no height or weight on file to calculate BMI.  General: Pleasant elderly female appearing in no acute distress. HEENT: normal Neck: no JVD Vascular: No carotid bruits; Distal pulses 2+ bilaterally Cardiac:  normal S1, S2; Regular rhythm, bradycardiac rate.  no murmur. Lungs:  clear to auscultation bilaterally, no wheezing, rhonchi or rales  Abd: soft, nontender, no hepatomegaly  Ext: Trace lower extremity edema with varicose veins noted. Musculoskeletal:  No deformities, BUE and BLE strength normal and equal Skin: warm and dry  Neuro: Expressive aphasia.  Psych:  Normal affect.  EKG:  The EKG was personally reviewed and demonstrates: NSR, HR 66 with anterior infarct pattern with TWI along AVL.   Telemetry:  Telemetry was personally reviewed and demonstrates: Sinus bradycardia, HR in 50's to 60's.   Relevant CV Studies:  Echocardiogram: 03/03/2022 IMPRESSIONS     1. Left ventricular ejection fraction, by estimation, is 30 to 35%. The  left ventricle has moderately decreased function. The left ventricle  demonstrates global hypokinesis.  Left ventricular diastolic parameters are  indeterminate.   2. Right ventricular systolic function is normal. The right ventricular  size is normal. There is mildly elevated pulmonary artery systolic  pressure.   3. Left atrial size was mildly dilated.   4. Trivial mitral valve regurgitation.   5. Aortic valve regurgitation is not visualized.   6. The inferior vena cava is normal in size with greater than 50%  respiratory variability, suggesting right atrial pressure of 3 mmHg.   Comparison(s): No prior Echocardiogram.   NST: 07/2021 1. Dyspnea, cough, weakness and overall strange feeling occurred during  Lexiscan study.     2.  Nondiagnostic stress Lexiscan EKG changes.   3. Correlation of EKG and perfusion images demonstrate      EKG is non-diagnostic with normal perfusion        4. SPECT : Normal perfusion with no evidence of ischemia or infarction.   5. Gated images reveal LVEF = 32%. This represents a abnormal functional  finding.  By visual inspection the ejection fraction appears to be better  than this number suggest and recommendation is for an echocardiogram to  assess LVEF   6. This is a low risk study by perfusion SPECT analysis   7. Compared to previous report from July 2020, this study reveals no  change in SPECT analysis however in the prior study the ejection fraction  left ventricle was 72%.   Echocardiogram: 07/2021 Summary   Normal LV size and systolic function. Estimated EF biplane=60-65%.  Grade II diastolic dysfunction (moderate increase in filling pressures).  Moderate tricuspid valve regurgitation is noted by color. RVSP 58=mmHg, CVP=3.  In comparison to echo report on 04/21/19, interval change is noted.     Laboratory Data:  High Sensitivity Troponin:  No results for input(s): TROPONINIHS in the last 720 hours.   Chemistry Recent Labs  Lab 03/02/22 2157 03/02/22 2207 03/03/22 0414  NA 136 135 137  K 3.7 3.7 3.6  CL 98 98 102  CO2 26  --   25  GLUCOSE 123* 118* 114*  BUN 28* 29* 26*  CREATININE 1.14* 1.20* 1.05*  CALCIUM 9.7  --  9.6  MG  --   --  2.3  GFRNONAA 48*  --  53*  ANIONGAP 12  --  10    Recent Labs  Lab 03/02/22 2157  PROT 7.2  ALBUMIN 4.4  AST 30  ALT 17  ALKPHOS 74  BILITOT 0.4   Lipids  Recent Labs  Lab 03/03/22 0414  CHOL 278*  TRIG 112  HDL 58  LDLCALC 198*  CHOLHDL 4.8    Hematology Recent Labs  Lab 03/02/22 2157 03/02/22 2207 03/03/22 0414  WBC 6.5  --  9.4  RBC 3.98  --  4.02  HGB 12.2 12.6 12.4  HCT 35.8* 37.0 35.6*  MCV 89.9  --  88.6  MCH 30.7  --  30.8  MCHC 34.1  --  34.8  RDW 12.8  --  12.9  PLT 290  --  278   Thyroid No results for input(s): TSH, FREET4 in the last 168 hours.  BNPNo results for input(s): BNP, PROBNP in the last 168 hours.  DDimer No results for input(s): DDIMER in the last 168 hours.   Radiology/Studies:  MR ANGIO HEAD WO CONTRAST  Result Date: 03/03/2022 CLINICAL DATA:  Initial evaluation for neuro deficit, stroke suspected. EXAM: MRI HEAD WITHOUT CONTRAST MRA HEAD WITHOUT CONTRAST MRA NECK WITHOUT AND WITH CONTRAST TECHNIQUE: Multiplanar, multi-echo pulse sequences of the brain and surrounding structures were acquired without intravenous contrast. Angiographic images of the Circle of Willis were acquired using MRA technique without intravenous contrast. Angiographic images of the neck were acquired using MRA technique without and with intravenous contrast. Carotid stenosis measurements (when applicable) are obtained utilizing NASCET criteria, using the distal internal carotid diameter as the denominator. CONTRAST:  Please see contrast documentation. COMPARISON:  Prior head CT from 03/02/2022. FINDINGS: MRI HEAD FINDINGS Brain: Generalized age-related cerebral volume loss. Scattered patchy T2/FLAIR hyperintensity involving the periventricular deep white matter both cerebral hemispheres, most like related chronic microvascular ischemic disease, mild for  age. 2.5 cm curvilinear ischemic infarcts seen involving the left basal ganglia. No associated hemorrhage or mass effect. No other evidence for acute or subacute ischemia. No other areas of chronic cortical infarction. No acute or chronic intracranial blood products. No mass lesion, midline shift or mass effect. No hydrocephalus or extra-axial fluid collection. Partially empty sella noted. Vascular: Major intracranial vascular flow voids are maintained. Skull and upper cervical spine: Cranial junction within normal limits. Bone marrow signal intensity within normal limits. No scalp soft tissue abnormality. Sinuses/Orbits: Prior bilateral ocular lens replacement. Scattered mucosal thickening noted about the ethmoidal air cells. Paranasal sinuses are otherwise clear. No significant mastoid effusion. Inner ear structures grossly normal. Other: None. MRA HEAD FINDINGS Anterior circulation: Examination mildly degraded by motion. Both internal carotid arteries patent to the termini without stenosis or other abnormality. A1 segments patent. Normal anterior communicating artery complex. Anterior cerebral arteries patent without significant stenosis. No M1 stenosis or occlusion. No proximal MCA branch occlusion. Distal MCA branches perfused and symmetric. Mild distal small vessel atheromatous irregularity. Posterior circulation: Both V4 segments patent without stenosis. Right vertebral artery slightly dominant. Partially visualized PICA patent bilaterally. Basilar patent to its distal aspect without stenosis. Superior cerebellar and posterior cerebral arteries patent bilaterally. Anatomic variants: None significant.  No aneurysm. MRA NECK FINDINGS Aortic arch: Examination technically limited due to timing  of the contrast bolus, with no contrast seen within the arterial system. Aortic arch and origin of the great vessels not seen or assessed on this exam. Right carotid system: Visualized right CCA patent without stenosis.  Mild atheromatous irregularity about the right carotid bulb/proximal right ICA without hemodynamically greater than 50% stenosis. Right ICA patent distally without stenosis or evidence for dissection. Left carotid system: Eccentric plaque noted within the mid-distal left CCA without significant stenosis. Mild for age atheromatous irregularity about the left carotid bulb/proximal left ICA without hemodynamically significant greater than 50% stenosis. Left ICA patent distally without stenosis or evidence for dissection. Vertebral arteries: Both vertebral arteries appear to arise from the subclavian arteries. No visible proximal subclavian artery stenosis. Vertebral arteries patent within the neck without visible stenosis or evidence for dissection. Other: None IMPRESSION: MRI HEAD: 1. 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 2. Underlying age-related cerebral volume loss with mild chronic microvascular ischemic disease. MRA HEAD: 1. Negative intracranial MRA for large vessel occlusion. No hemodynamically significant or correctable stenosis. 2. Mild distal small vessel atheromatous irregularity. MRA NECK: 1. Technically limited exam due to timing of the contrast bolus, with no contrast seen within the arterial system of the neck. 2. Mild atherosclerotic change about the carotid bifurcations/proximal ICAs bilaterally without hemodynamically significant greater than 50% stenosis. 3. Patent vertebral arteries within the neck without significant stenosis or other acute finding. Electronically Signed   By: Jeannine Boga M.D.   On: 03/03/2022 02:47   MR Angiogram Neck W or Wo Contrast  Result Date: 03/03/2022 CLINICAL DATA:  Initial evaluation for neuro deficit, stroke suspected. EXAM: MRI HEAD WITHOUT CONTRAST MRA HEAD WITHOUT CONTRAST MRA NECK WITHOUT AND WITH CONTRAST TECHNIQUE: Multiplanar, multi-echo pulse sequences of the brain and surrounding structures were acquired without  intravenous contrast. Angiographic images of the Circle of Willis were acquired using MRA technique without intravenous contrast. Angiographic images of the neck were acquired using MRA technique without and with intravenous contrast. Carotid stenosis measurements (when applicable) are obtained utilizing NASCET criteria, using the distal internal carotid diameter as the denominator. CONTRAST:  Please see contrast documentation. COMPARISON:  Prior head CT from 03/02/2022. FINDINGS: MRI HEAD FINDINGS Brain: Generalized age-related cerebral volume loss. Scattered patchy T2/FLAIR hyperintensity involving the periventricular deep white matter both cerebral hemispheres, most like related chronic microvascular ischemic disease, mild for age. 2.5 cm curvilinear ischemic infarcts seen involving the left basal ganglia. No associated hemorrhage or mass effect. No other evidence for acute or subacute ischemia. No other areas of chronic cortical infarction. No acute or chronic intracranial blood products. No mass lesion, midline shift or mass effect. No hydrocephalus or extra-axial fluid collection. Partially empty sella noted. Vascular: Major intracranial vascular flow voids are maintained. Skull and upper cervical spine: Cranial junction within normal limits. Bone marrow signal intensity within normal limits. No scalp soft tissue abnormality. Sinuses/Orbits: Prior bilateral ocular lens replacement. Scattered mucosal thickening noted about the ethmoidal air cells. Paranasal sinuses are otherwise clear. No significant mastoid effusion. Inner ear structures grossly normal. Other: None. MRA HEAD FINDINGS Anterior circulation: Examination mildly degraded by motion. Both internal carotid arteries patent to the termini without stenosis or other abnormality. A1 segments patent. Normal anterior communicating artery complex. Anterior cerebral arteries patent without significant stenosis. No M1 stenosis or occlusion. No proximal MCA  branch occlusion. Distal MCA branches perfused and symmetric. Mild distal small vessel atheromatous irregularity. Posterior circulation: Both V4 segments patent without stenosis. Right vertebral artery slightly dominant. Partially visualized  PICA patent bilaterally. Basilar patent to its distal aspect without stenosis. Superior cerebellar and posterior cerebral arteries patent bilaterally. Anatomic variants: None significant.  No aneurysm. MRA NECK FINDINGS Aortic arch: Examination technically limited due to timing of the contrast bolus, with no contrast seen within the arterial system. Aortic arch and origin of the great vessels not seen or assessed on this exam. Right carotid system: Visualized right CCA patent without stenosis. Mild atheromatous irregularity about the right carotid bulb/proximal right ICA without hemodynamically greater than 50% stenosis. Right ICA patent distally without stenosis or evidence for dissection. Left carotid system: Eccentric plaque noted within the mid-distal left CCA without significant stenosis. Mild for age atheromatous irregularity about the left carotid bulb/proximal left ICA without hemodynamically significant greater than 50% stenosis. Left ICA patent distally without stenosis or evidence for dissection. Vertebral arteries: Both vertebral arteries appear to arise from the subclavian arteries. No visible proximal subclavian artery stenosis. Vertebral arteries patent within the neck without visible stenosis or evidence for dissection. Other: None IMPRESSION: MRI HEAD: 1. 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 2. Underlying age-related cerebral volume loss with mild chronic microvascular ischemic disease. MRA HEAD: 1. Negative intracranial MRA for large vessel occlusion. No hemodynamically significant or correctable stenosis. 2. Mild distal small vessel atheromatous irregularity. MRA NECK: 1. Technically limited exam due to timing of the contrast bolus,  with no contrast seen within the arterial system of the neck. 2. Mild atherosclerotic change about the carotid bifurcations/proximal ICAs bilaterally without hemodynamically significant greater than 50% stenosis. 3. Patent vertebral arteries within the neck without significant stenosis or other acute finding. Electronically Signed   By: Jeannine Boga M.D.   On: 03/03/2022 02:47   MR BRAIN WO CONTRAST  Result Date: 03/03/2022 CLINICAL DATA:  Initial evaluation for neuro deficit, stroke suspected. EXAM: MRI HEAD WITHOUT CONTRAST MRA HEAD WITHOUT CONTRAST MRA NECK WITHOUT AND WITH CONTRAST TECHNIQUE: Multiplanar, multi-echo pulse sequences of the brain and surrounding structures were acquired without intravenous contrast. Angiographic images of the Circle of Willis were acquired using MRA technique without intravenous contrast. Angiographic images of the neck were acquired using MRA technique without and with intravenous contrast. Carotid stenosis measurements (when applicable) are obtained utilizing NASCET criteria, using the distal internal carotid diameter as the denominator. CONTRAST:  Please see contrast documentation. COMPARISON:  Prior head CT from 03/02/2022. FINDINGS: MRI HEAD FINDINGS Brain: Generalized age-related cerebral volume loss. Scattered patchy T2/FLAIR hyperintensity involving the periventricular deep white matter both cerebral hemispheres, most like related chronic microvascular ischemic disease, mild for age. 2.5 cm curvilinear ischemic infarcts seen involving the left basal ganglia. No associated hemorrhage or mass effect. No other evidence for acute or subacute ischemia. No other areas of chronic cortical infarction. No acute or chronic intracranial blood products. No mass lesion, midline shift or mass effect. No hydrocephalus or extra-axial fluid collection. Partially empty sella noted. Vascular: Major intracranial vascular flow voids are maintained. Skull and upper cervical spine:  Cranial junction within normal limits. Bone marrow signal intensity within normal limits. No scalp soft tissue abnormality. Sinuses/Orbits: Prior bilateral ocular lens replacement. Scattered mucosal thickening noted about the ethmoidal air cells. Paranasal sinuses are otherwise clear. No significant mastoid effusion. Inner ear structures grossly normal. Other: None. MRA HEAD FINDINGS Anterior circulation: Examination mildly degraded by motion. Both internal carotid arteries patent to the termini without stenosis or other abnormality. A1 segments patent. Normal anterior communicating artery complex. Anterior cerebral arteries patent without significant stenosis. No M1 stenosis or  occlusion. No proximal MCA branch occlusion. Distal MCA branches perfused and symmetric. Mild distal small vessel atheromatous irregularity. Posterior circulation: Both V4 segments patent without stenosis. Right vertebral artery slightly dominant. Partially visualized PICA patent bilaterally. Basilar patent to its distal aspect without stenosis. Superior cerebellar and posterior cerebral arteries patent bilaterally. Anatomic variants: None significant.  No aneurysm. MRA NECK FINDINGS Aortic arch: Examination technically limited due to timing of the contrast bolus, with no contrast seen within the arterial system. Aortic arch and origin of the great vessels not seen or assessed on this exam. Right carotid system: Visualized right CCA patent without stenosis. Mild atheromatous irregularity about the right carotid bulb/proximal right ICA without hemodynamically greater than 50% stenosis. Right ICA patent distally without stenosis or evidence for dissection. Left carotid system: Eccentric plaque noted within the mid-distal left CCA without significant stenosis. Mild for age atheromatous irregularity about the left carotid bulb/proximal left ICA without hemodynamically significant greater than 50% stenosis. Left ICA patent distally without  stenosis or evidence for dissection. Vertebral arteries: Both vertebral arteries appear to arise from the subclavian arteries. No visible proximal subclavian artery stenosis. Vertebral arteries patent within the neck without visible stenosis or evidence for dissection. Other: None IMPRESSION: MRI HEAD: 1. 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 2. Underlying age-related cerebral volume loss with mild chronic microvascular ischemic disease. MRA HEAD: 1. Negative intracranial MRA for large vessel occlusion. No hemodynamically significant or correctable stenosis. 2. Mild distal small vessel atheromatous irregularity. MRA NECK: 1. Technically limited exam due to timing of the contrast bolus, with no contrast seen within the arterial system of the neck. 2. Mild atherosclerotic change about the carotid bifurcations/proximal ICAs bilaterally without hemodynamically significant greater than 50% stenosis. 3. Patent vertebral arteries within the neck without significant stenosis or other acute finding. Electronically Signed   By: Jeannine Boga M.D.   On: 03/03/2022 02:47   CT HEAD CODE STROKE WO CONTRAST  Result Date: 03/02/2022 CLINICAL DATA:  Code stroke. Initial evaluation for neuro deficit, stroke suspected. EXAM: CT HEAD WITHOUT CONTRAST TECHNIQUE: Contiguous axial images were obtained from the base of the skull through the vertex without intravenous contrast. RADIATION DOSE REDUCTION: This exam was performed according to the departmental dose-optimization program which includes automated exposure control, adjustment of the mA and/or kV according to patient size and/or use of iterative reconstruction technique. COMPARISON:  None Available. FINDINGS: Brain: Age-related cerebral atrophy with chronic small vessel ischemic disease. No acute intracranial hemorrhage. No acute large vessel territory infarct. No mass lesion or midline shift. No hydrocephalus or extra-axial fluid collection.  Vascular: No hyperdense vessel. Calcified atherosclerosis present at skull base. Skull: Scalp soft tissues and calvarium within normal limits. Sinuses/Orbits: Globes and orbital soft tissues demonstrate no acute finding. Scattered mucosal thickening noted within the ethmoidal air cells. Paranasal sinuses are otherwise clear. No mastoid effusion. Other: None. ASPECTS [facility] Stroke Program Early CT Score) - Ganglionic level infarction (caudate, lentiform nuclei, internal capsule, insula, M1-M3 cortex): 7 - Supraganglionic infarction (M4-M6 cortex): 3 Total score (0-10 with 10 being normal): 10 IMPRESSION: 1. No acute intracranial abnormality. 2. ASPECTS is 10. 3. Age-related cerebral atrophy with mild chronic small vessel ischemic disease. These results were communicated to Dr. Theda Sers at 9:51 pm on 03/02/2022 by text page via the [facility] messaging system. Electronically Signed   By: Jeannine Boga M.D.   On: 03/02/2022 21:51     Assessment and Plan:   1. HFrEF/New Cardiomyopathy - She is currently admitted for  an acute CVA and found to have a new cardiomyopathy with EF of 30-35% with global HK (EF previously normal in 07/2021). She denies any recent respiratory issues and only has trace lower extremity edema on examination today. - Given her acute CVA, would anticipate medical management of her cardiomyopathy for now with plans for a follow-up echocardiogram in several months and can consider ischemic evaluation at that time if EF remains reduced.  Of note, she did have an NST in 07/2021 which showed no ischemia. - She was on Torsemide 40mg  daily, Losartan 50 mg daily and Coreg 12.5 mg twice daily as an outpatient but all have been held to allow for permissive hypertension in the setting of her CVA. Reviewed with Neurology who was at the bedside and can gradually start to add back medical therapy for her cardiomyopathy. Given her heart rate in the 50's, would likely avoid beta-blocker therapy for now but  can restart Losartan at 12.5 mg daily and pending BP trend, ultimately switch to [facility]. Would also consider Spironolactone and an SGLT2 inhibitor. She was previously receiving IV fluids and these were discontinued today. Does not need IV diuresis at this time.   2. HTN - BP has been variable from 120/56 -158/81 within the past 24 hours, at 120/74 most recent check. Would plan to add back low-dose Losartan as outlined above and adjust medical therapy throughout this admission as BP allows.  3. HLD - FLP shows total cholesterol of 278, triglycerides 112, HDL 58 and LDL 198. She has been started on Atorvastatin 80mg  daily. Will need repeat FLP and LFT's in 8 weeks.   4. Stage 3 CKD - Creatinine stable at 1.05 on most recent check.   5. Acute CVA - MRI Brain showed a 2.5 cm acute ischemic nonhemorrhagic left basal ganglia infarct and chronic microvascular ischemic disease. PT evaluated the patient and recommended CIR. Neurology following and recommending a Loop Recorder. A message has been sent to the Card Master and EP Team to make them aware.  - Remains on ASA, Plavix and statin therapy.    For questions or updates, please contact [place] Please consult [email or web address] for contact info under    Signed, Erma Heritage, PA-C  03/04/2022 10:51 AM

## 2022-03-04 NOTE — Progress Notes (Addendum)
STROKE TEAM PROGRESS NOTE   INTERVAL HISTORY No family is at  the bedside.Cardiology team PA is at bedside This am her aphasia has some what improved, still  having difficulty expressive aphasia.   Echocardiogram shows diminished EF of 30 to 35% when it was normal in October last year.  Cardiology team has started goal-directed medical therapy..  Vital signs stable.  Neurological exam unchanged Vitals:   03/03/22 2300 03/04/22 0000 03/04/22 0400 03/04/22 0500  BP: 140/63 (!) 122/54 128/74   Pulse: 63 (!) 58    Resp: 20 (!) 21 20   Temp: 98.2 F (36.8 C) 98 F (36.7 C) 98.5 F (36.9 C)   TempSrc: Oral  Oral   SpO2: 96% 96% 99%   Weight:    63.1 kg   CBC:  Recent Labs  Lab 03/02/22 2157 03/02/22 2207 03/03/22 0414  WBC 6.5  --  9.4  NEUTROABS 3.4  --   --   HGB 12.2 12.6 12.4  HCT 35.8* 37.0 35.6*  MCV 89.9  --  88.6  PLT 290  --  0000000   Basic Metabolic Panel:  Recent Labs  Lab 03/02/22 2157 03/02/22 2207 03/03/22 0414  NA 136 135 137  K 3.7 3.7 3.6  CL 98 98 102  CO2 26  --  25  GLUCOSE 123* 118* 114*  BUN 28* 29* 26*  CREATININE 1.14* 1.20* 1.05*  CALCIUM 9.7  --  9.6  MG  --   --  2.3  PHOS  --   --  3.4   Lipid Panel:  Recent Labs  Lab 03/03/22 0414  CHOL 278*  TRIG 112  HDL 58  CHOLHDL 4.8  VLDL 22  LDLCALC 198*   HgbA1c:  Recent Labs  Lab 03/03/22 0414  HGBA1C 6.0*   Urine Drug Screen:  Recent Labs  Lab 03/03/22 0457  LABOPIA NONE DETECTED  COCAINSCRNUR NONE DETECTED  LABBENZ NONE DETECTED  AMPHETMU NONE DETECTED  THCU NONE DETECTED  LABBARB NONE DETECTED    Alcohol Level  Recent Labs  Lab 03/02/22 2157  ETH <10    IMAGING past 24 hours No results found.  PHYSICAL EXAM  Physical Exam  Constitutional: Appears well-developed and well-nourished pleasant elderly Caucasian lady.  Cardiovascular: Normal rate and regular rhythm.  Respiratory: Effort normal, non-labored breathing  Neuro: Mental Status: Patient is sitting up in  bed  Patient is awake, alert, oriented to person, place, month, year, and situation. She still has mild expressive aphasia with word finding difficulties and paraphasic errors.  Good repetition and slight difficulty with naming.  Good comprehension.  Speech is dysarthric Cranial Nerves: II: Visual Fields are full. Pupils are equal, round, and reactive to light.   III,IV, VI: EOMI without ptosis or diploplia.  V: Facial sensation is symmetric to temperature VII: Slight right lower facial asymmetry on smiling.  VIII: Hearing is intact to voice X: Palate elevates symmetrically XI: Shoulder shrug is symmetric. XII: Tongue protrudes midline without atrophy or fasciculations.  Motor: Tone is normal. Bulk is normal. 5/5 strength was present in all four extremities.  Except diminished fine finger movements on the right and mild right grip weakness and orbits left over right upper extremity. Sensory: Sensation is symmetric to light touch and temperature in the arms and legs. No extinction to DSS present.  Plantars: Toes are downgoing bilaterally.  Cerebellar: FNF and HKS are intact bilaterally   ASSESSMENT/PLAN Tamara Bullock is a 81 y.o. female with history of generalized  anxiety, essential hypertension, CKD 3B presenting with right sided facial droop, weakness, and expressive aphasia. Patients Brain MRI revealed acute ischemic stroke of left basal ganglia most likely due to cardio embolic event. 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 Small vessel disease. MRA Head w/o contrast: negative intracranial large vessel occlusion MRA Neck w/w/o contrast: Technically limited exam due to timing of contrast bolus, mild atherosclerotic change carotid bifurcation/proximal ICA bilaterally greater than 50%   MRI  Brain  :acute ischemic stroke left basal ganglia. 2D Echo : Pending LDL 198 HgbA1c 6.0 VTE prophylaxis - SCD    Diet   Diet regular Room service appropriate? Yes with Assist; Fluid consistency: Thin   aspirin 81 mg daily prior to admission, now on aspirin 81 mg daily and clopidogrel 75 mg daily.x21 days and then stop ASA and continue with Plavix 75 mg q day indefinitely  Therapy recommendations:  CIR Disposition: Medical Telemetry  Hypertension Home meds:  Resume per primary team  Stable Permissive hypertension (OK if < 220/120) but gradually normalize in 5-7 days Long-term BP goal normotensive  Hyperlipidemia LDL 198, goal < 70 Add  Atorvastatin 80 mg po qhs High intensity statin indicated Continue statin at discharge  Other Stroke Risk Factors Advanced Age >/= 21  Family hx stroke  Hypertension CKD III B    Hospital day # 1    Patient has presented with aphasia and right facial weakness secondary to large left subcortical infarct likely from cryptogenic embolic source with strong suspicion for underlying paroxysmal A-fib.  Recommend check echocardiogram and continue cardiac monitoring.  She may need loop recorder at discharge to look for paroxysmal A-fib.  Continue mobilization out of bed and physical Occupational Therapy.  Medical management of heart failure as per cardiology.  Transfer to inpatient rehab when bed available.  Aspirin and Plavix for 3 weeks followed by Plavix alone and aggressive risk factor modification.  Long discussion with the patient and her daughter and son at the bedside and answered questions.  Discussed with cardiology team PA.  Stroke team will sign off.  Kindly call for questions..  Greater than 50% time during this 35-minute visit was spent in counseling and coordination of care about her stroke and aphasia and discussion about evaluation and prevention and treatment and answering questions.  Antony Contras, MD Medical Director [place] Pager: [phone] 03/04/2022 1:02 PM    To contact Stroke Continuity provider, please refer to [email or web address] After hours, contact General Neurology

## 2022-03-04 NOTE — Progress Notes (Signed)
  Progress Note   Patient: Tamara Bullock [id number] DOB: 05-17-1941 DOA: 03/02/2022     1 DOS: the patient was seen and examined on 03/04/2022 at 2:35PM      Brief hospital course: 81 y.o. female with medical history significant for generalized anxiety, essential hypertension, CKD 3B, who presented to [facility] ED from home via EMS due to sudden onset expressive aphasia.  Associated with right sided weakness.  Last known well was 03/02/22 around 11 AM.  Per EMS her SBP was >200 on arrival.  Presented outside of window for TPA.  Upon presentation to the ED, she was noted to be aphasic with decrease grip strength on the right.  CT head negative for any acute intracranial findings.  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      Assessment and Plan: * Acute ischemic stroke (HCC) - Continue aspirin and atorvastatin - Continue Lovenox - Echocardiogram without cardiogenic source - Noninvasive angiography shows normal carotids - Lipids measured, PT consulted, stroke swallow evaluation done, non-smoker  Chronic systolic CHF (congestive heart failure) ([place]) Care everywhere shows EF 60% last fall.  Echocardiogram here shows EF now reduced.  - Plan for outpatient stress - Continue aspirin, atorvastatin - Resume home losartan - Hold home Coreg, amiloride   Acute urinary retention - Up to the bedside commode to urinate  Hypothyroidism - Continue levothyroxine  Depression - Resume home sertraline and mirtazapine - Family have asked to continue home Ativan twice daily as needed which is an unsafe medication for this patient  Stage 3a chronic kidney disease (CKD) (HCC) Creatinine stable relative to baseline  Essential hypertension Blood pressure low normal - Continue losartan - Hold amiloride, carvedilol, clonidine, losartan, torsemide          Subjective: Patient has no complaints.  She still has dysarthria.  She still has some hemiparesis.     Physical Exam: Vitals:    03/03/22 2300 03/04/22 0000 03/04/22 0400 03/04/22 0500  BP: 140/63 (!) 122/54 128/74   Pulse: 63 (!) 58    Resp: 20 (!) 21 20   Temp: 98.2 F (36.8 C) 98 F (36.7 C) 98.5 F (36.9 C)   TempSrc: Oral  Oral   SpO2: 96% 96% 99%   Weight:    63.1 kg   Elderly adult female, weak and tired, lying in bed, no acute distress RRR, no murmurs, no lower extremity edema Respiratory rate normal, lungs clear without rales or wheezes Right facial droop, right-sided weakness, speech is dysarthric, she seems attentive to my conversation but is hard to understand, she follows commands appropriately  Data Reviewed: Neurology notes reviewed.  Discussed with cardiology.  Nursing notes reviewed, vital signs reviewed       Disposition: Status is: Inpatient         Author: Edwin Dada, MD 03/04/2022 2:43 PM  For on call review [email or web address].

## 2022-03-04 NOTE — Assessment & Plan Note (Signed)
Care everywhere shows EF 60% last fall.  Echocardiogram here shows EF now reduced.  - Plan for outpatient stress - Continue aspirin, atorvastatin - Resume home losartan - Hold home Coreg, amiloride

## 2022-03-04 NOTE — Assessment & Plan Note (Signed)
Creatinine stable relative to baseline 

## 2022-03-04 NOTE — Assessment & Plan Note (Signed)
Continue levothyroxine 

## 2022-03-04 NOTE — PMR Pre-admission (Signed)
PMR Admission Coordinator Pre-Admission Assessment  Patient: Tamara Bullock is an 81 y.o., female MRN: [id number] DOB: November 15, 1940 Height:   Weight: 64.8 kg  Insurance Information HMO: yes    PPO:      PCP:      IPA:      80/20:      OTHER:  PRIMARY: BCBS Medicare      Policy#: [id number]      Subscriber: patient CM Name: Larene Beach      Phone#: [phone]     Fax#: [phone] Pre-Cert#: tbd on admit      Employer:  Benefits:  Phone #: [phone]     Name:  Eff. Date: 10/01/21     Deduct: does not have one Out of Pocket Max: $3,950 ($35 met)      Life Max: NA CIR: $335/day co=pay for days 1-5      SNF: 100% coverage for days 1-20, $196/day co-pay for days 21-60, 100% coverage for days 61-100 Outpatient: $10 co-pay/visit     Co-Pay:  Home Health: 100% coverage      Co-Pay:  DME: 100% coverage     Co-Pay:  Providers: in-network SECONDARY:       Policy#:     Phone#:   Development worker, community:       Phone#:   The Engineer, petroleum" for patients in Inpatient Rehabilitation Facilities with attached "Privacy Act [place] Records" was provided and verbally reviewed with: Patient and Family  Emergency Contact Information Contact Information     Name Relation Home Work Mobile   Spong,BOBBY Spouse [phone]     Alfredo, [place]   [place] Daughter   [phone]       Current Medical History  Patient Admitting Diagnosis: CVA History of Present Illness: Pt is an 81 year old female with medical hx significant for: CKD 3B, essential HTN, generalized anxiety. Pt [facility] on [facility]22 d/t right-sided facial droop, right arm weakness, right leg weakness, and some aphasia. Code stroke activated in ED. Pt not a candidate for tPA. CT head negative for acute intracranial abnormality. MRI brain showed acute ischemic stroke in left basal ganglia. MRA head negative for LVO; showed mild atherosclerotic changes at both  bifurcations. 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 RV function was normal with only trivial MR and no significant valve abnormalities.  Stroke team recommended loop recorder at d/c to monitor for afib as well as DAPT aspirin and Plavix x3 weeks, then plavix alone.  Therapy evaluations completed and CIR recommended.   Complete NIHSS TOTAL: 3  Patient's medical record [facility] has been reviewed by the rehabilitation admission coordinator and physician.  Past Medical History  Past Medical History:  Diagnosis Date   Essential hypertension    Hyperlipidemia     Has the patient had major surgery during 100 days prior to admission? No  Family History   family history includes Heart attack in her father; Stroke in her mother.  Current Medications  Current Facility-Administered Medications:    aspirin EC tablet 81 mg, 81 mg, Oral, Daily, Irene Pap N, DO, 81 mg at 03/06/22 0943   atorvastatin (LIPITOR) tablet 80 mg, 80 mg, Oral, Daily, Irene Pap N, DO, 80 mg at 03/06/22 0943   carvedilol (COREG) tablet 3.125 mg, 3.125 mg, Oral, BID WC, Janina Mayo, MD, 3.125 mg at 03/06/22 0943   clopidogrel (PLAVIX) tablet 75 mg, 75 mg, Oral, Daily, Hall,  Carole N, DO, 75 mg at 03/06/22 0942   enoxaparin (LOVENOX) injection 40 mg, 40 mg, Subcutaneous, Daily, Irene Pap N, DO, 40 mg at 03/06/22 0943   labetalol (NORMODYNE) injection 5 mg, 5 mg, Intravenous, Q2H PRN, Hall, Carole N, DO   levothyroxine (SYNTHROID) tablet 50 mcg, 50 mcg, Oral, Daily, Irene Pap N, DO, 50 mcg at 03/06/22 5701   LORazepam (ATIVAN) tablet 0.5 mg, 0.5 mg, Oral, BID PRN, Edwin Dada, MD, 0.5 mg at 03/04/22 2050   losartan (COZAAR) tablet 12.5 mg, 12.5 mg, Oral, Daily, Branch, Royetta Crochet, MD, 12.5 mg at 03/06/22 0943   mirtazapine (REMERON) tablet 7.5 mg, 7.5 mg, Oral, QHS, Danford, Suann Larry, MD, 7.5 mg at 03/05/22 2141   sertraline (ZOLOFT) tablet 25 mg, 25 mg, Oral,  Daily, Danford, Suann Larry, MD, 25 mg at 03/06/22 0943   torsemide (DEMADEX) tablet 40 mg, 40 mg, Oral, Daily, Danford, Suann Larry, MD, 40 mg at 03/06/22 7793  Patients Current Diet:  Diet Order             Diet regular Room service appropriate? Yes with Assist; Fluid consistency: Thin  Diet effective now                   Precautions / Restrictions Precautions Precautions: Fall Restrictions Weight Bearing Restrictions: No   Has the patient had 2 or more falls or a fall with injury in the past year? Yes  Prior Activity Level Limited Community (1-2x/wk): indep at baseline without DME (family reports falls), driving locally  Prior Functional Level Self Care: Did the patient need help bathing, dressing, using the toilet or eating? Independent  Indoor Mobility: Did the patient need assistance with walking from room to room (with or without device)? Independent  Stairs: Did the patient need assistance with internal or external stairs (with or without device)? Independent  Functional Cognition: Did the patient need help planning regular tasks such as shopping or remembering to take medications? Needed some help  Patient Information Are you of Hispanic, Latino/a,or Spanish origin?: A. No, not of Hispanic, Latino/a, or Spanish origin What is your race?: A. White Do you need or want an interpreter to communicate with a doctor or health care staff?: 0. No  Patient's Response To:  Health Literacy and Transportation Is the patient able to respond to health literacy and transportation needs?: Yes Health Literacy - How often do you need to have someone help you when you read instructions, pamphlets, or other written material from your doctor or pharmacy?: Never In the past 12 months, has lack of transportation kept you from medical appointments or from getting medications?: No In the past 12 months, has lack of transportation kept you from meetings, work, or from getting things  needed for daily living?: No  Development worker, international aid / [place] Devices/Equipment: None Home Equipment: Rollator (4 wheels)  Prior Device Use: Indicate devices/aids used by the patient prior to current illness, exacerbation or injury? Walker  Current Functional Level Cognition  Arousal/Alertness: Awake/alert Overall Cognitive Status: History of cognitive impairments - at baseline Orientation Level: Oriented X4 Safety/Judgement: Decreased awareness of deficits, Decreased awareness of safety General Comments: dementia at baseline Attention: Sustained Sustained Attention: Appears intact Awareness: Impaired Awareness Impairment: Intellectual impairment    Extremity Assessment (includes Sensation/Coordination)  Upper Extremity Assessment: RUE deficits/detail, LUE deficits/detail RUE Deficits / Details: 2+/5 should AROM, 3+/5 elbow distal. loosely gripping rw RUE Sensation: decreased light touch RUE Coordination: decreased fine motor, decreased gross  motor LUE Sensation: WNL LUE Coordination: decreased fine motor, decreased gross motor  Lower Extremity Assessment: Defer to PT evaluation RLE Deficits / Details: grossly 3+/5    ADLs  Overall ADL's : Needs assistance/impaired Grooming: Wash/dry hands, Wash/dry face, Minimal assistance, Sitting Upper Body Bathing: Moderate assistance, Sitting Lower Body Bathing: Maximal assistance, Sit to/from stand Upper Body Dressing : Moderate assistance, Sitting Lower Body Dressing: Maximal assistance, Sit to/from stand Toilet Transfer: Minimal assistance, +2 for physical assistance Functional mobility during ADLs: Minimal assistance, Rolling walker (2 wheels) (side stepped along EOB) General ADL Comments: Cues for inititation. extra time and effort with bed mobility, cues for technique but no physical assist needed. Min - mod A to stand and sidestep towards right. +lightheadedness.    Mobility  Overal bed mobility: Needs  Assistance Bed Mobility: Supine to Sit, Sit to Supine Supine to sit: Min guard, HOB elevated Sit to supine: Min guard General bed mobility comments: close min guard, extra time and effort to come to EOB position. no physical assist needed this session.    Transfers  Overall transfer level: Needs assistance Equipment used: Rolling walker (2 wheels) Transfers: Sit to/from Stand Sit to Stand: Mod assist, Min assist Bed to/from chair/wheelchair/BSC transfer type:: Step pivot Step pivot transfers: Mod assist General transfer comment: Mod A on initial stand, min A to sit. utilized rw. multimodal cues for rw technique. Pt prefering to pull up on rw so assist for stabilizing rw.    Ambulation / Gait / Stairs / Wheelchair Mobility  Ambulation/Gait Ambulation/Gait assistance: Mod assist Gait Distance (Feet): 10 Feet Assistive device: Rolling walker (2 wheels) Gait Pattern/deviations: Decreased stride length, Trunk flexed, Leaning posteriorly, Shuffle, Step-through pattern General Gait Details: Poor balance reactions. Leaning posteriorly. Distance limited by fatigue. Gait velocity: decreased Gait velocity interpretation: <1.8 ft/sec, indicate of risk for recurrent falls    Posture / Balance Dynamic Sitting Balance Sitting balance - Comments: reliant on UE support. Initial right lean, able to self correct with time and effort. Balance Overall balance assessment: Needs assistance Sitting-balance support: No upper extremity supported, Feet supported Sitting balance-Leahy Scale: Fair Sitting balance - Comments: reliant on UE support. Initial right lean, able to self correct with time and effort. Postural control: Posterior lean Standing balance support: Bilateral upper extremity supported, During functional activity, Reliant on assistive device for balance Standing balance-Leahy Scale: Poor Standing balance comment: rw and increased steadying assist for dynamic standing tasks    Special  needs/care consideration    Previous Home Environment (from acute therapy documentation) Living Arrangements: Spouse/significant other  Lives With: Spouse Available Help at Discharge: Family, Available PRN/intermittently Type of Home: House Home Layout: One level Home Access: Stairs to enter Entrance Stairs-Rails: Right, Left Entrance Stairs-Number of Steps: 5 Bathroom Shower/Tub: Gaffer, Chiropodist: Standard Bathroom Accessibility: Yes How Accessible: Accessible via walker Home Care Services: No Additional Comments: pt helped husband last  weeks or so with B/D and clean him as well as help him get up as he recently broke elbpow aftger jos radoatopm complete.  Discharge Living Setting Plans for Discharge Living Setting: Patient's home, Lives with (comment) (spouse) Type of Home at Discharge: House Discharge Home Layout: One level Discharge Home Access: Stairs to enter Entrance Stairs-Rails: Right, Left Entrance Stairs-Number of Steps: 5 Discharge Bathroom Shower/Tub: Walk-in shower, Tub/shower unit Discharge Bathroom Toilet: Standard Discharge Bathroom Accessibility: Yes How Accessible: Accessible via walker Does the patient have any problems obtaining your medications?: No  Social/Family/Support Systems Patient Roles: Spouse  Anticipated Caregiver: children Lisabeth Pick, Reita Cliche), and other family members Anticipated Caregiver's Contact Information: daughters usually at bedside, Mortimer Fries [phone] Ability/Limitations of Caregiver: n/a Caregiver Availability: 24/7 Discharge Plan Discussed with Primary Caregiver: Yes Is Caregiver In Agreement with Plan?: Yes Does Caregiver/Family have Issues with Lodging/Transportation while Pt is in Rehab?: No  Goals Patient/Family Goal for Rehab: PT/OT/SLP supervision Expected length of stay: 10-12 days Pt/Family Agrees to Admission and willing to participate: Yes Program Orientation Provided & Reviewed with  Pt/Caregiver Including Roles  & Responsibilities: Yes  Barriers to Discharge: Insurance for SNF coverage  Decrease burden of Care through IP rehab admission: NA  Possible need for SNF placement upon discharge: Not anticipated  Patient Condition: I have reviewed medical records from [facility], spoken with CM, and patient, son, and daughter. I met with patient at the bedside for inpatient rehabilitation assessment.  Patient will benefit from ongoing PT, OT, and SLP, can actively participate in 3 hours of therapy a day 5 days of the week, and can make measurable gains during the admission.  Patient will also benefit from the coordinated team approach during an Inpatient Acute Rehabilitation admission.  The patient will receive intensive therapy as well as Rehabilitation physician, nursing, social worker, and care management interventions.  Due to safety, disease management, medication administration, pain management, and patient education the patient requires 24 hour a day rehabilitation nursing.  The patient is currently min A- mod A with mobility and basic ADLs.  Discharge setting and therapy post discharge at home with home health is anticipated.  Patient has agreed to participate in the Acute Inpatient Rehabilitation Program and will admit today.  Preadmission Screen Completed By:  Shann Medal, PT, DPT 03/06/2022 12:55 PM with updates by Clemens Catholic, MS, CCC-SLP  ______________________________________________________________________   Discussed status with Dr. Ranell Patrick  on 03/07/22 at 20  and received approval for admission 03/07/22.   Admission Coordinator:  Shann Medal, PT, DPT time 1500 /Date 03/07/22   Assessment/Plan: Diagnosis: Acute ischemic stroke Does the need for close, 24 hr/day Medical supervision in concert with the patient's rehab needs make it unreasonable for this patient to be served in a less intensive setting? Yes Co-Morbidities requiring supervision/potential  complications: essential HTN, stage 3a CKD, depression, hypothyroidism, acute urinary retention Due to bladder management, bowel management, safety, skin/wound care, disease management, medication administration, pain management, and patient education, does the patient require 24 hr/day rehab nursing? Yes Does the patient require coordinated care of a physician, rehab nurse, PT, OT, and SLP to address physical and functional deficits in the context of the above medical diagnosis(es)? Yes Addressing deficits in the following areas: balance, endurance, locomotion, strength, transferring, bowel/bladder control, bathing, dressing, feeding, grooming, toileting, cognition, speech, and psychosocial support Can the patient actively participate in an intensive therapy program of at least 3 hrs of therapy 5 days a week? Yes The potential for patient to make measurable gains while on inpatient rehab is excellent Anticipated functional outcomes upon discharge from inpatient rehab: supervision PT, supervision OT, supervision SLP Estimated rehab length of stay to reach the above functional goals is: 7-10 days Anticipated discharge destination: Home 10. Overall Rehab/Functional Prognosis: excellent   MD Signature: Leeroy Cha, MD

## 2022-03-04 NOTE — Progress Notes (Signed)
Speech Language Pathology Treatment: Dysphagia;Cognitive-Linquistic  Patient Details Name: Tamara Bullock MRN: [id number] DOB: July 18, 1941 Today's Date: 03/04/2022 Time: 0923-3007 SLP Time Calculation (min) (ACUTE ONLY): 15 min  Assessment / Plan / Recommendation Clinical Impression  Ms. Brogden is doing well with PO toleration. Continues with mild anterior spillage and min verbal cues to attend.  No S/S of aspiration. Session focused on improving clarity of speech, impacted both by dysarthria and aphasia.  Pt produced automatic sequences (DOW, MOY, counting) with verbal model needed to shift set between sequences.  She was able to slow rate and increase articulatory precision with min verbal cues to repeat and improve clarity. Recommend ongoing SLP f/u here and in next venue of care.   HPI HPI: Tamara Bullock is a 81 y.o. female who presented to [facility] ED from home via EMS due to sudden onset expressive aphasia and  right sided weakness.  Prior medical history significant for generalized anxiety, essential hypertension.  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      SLP Plan  Continue with current plan of care      Recommendations for follow up therapy are one component of a multi-disciplinary discharge planning process, led by the attending physician.  Recommendations may be updated based on patient status, additional functional criteria and insurance authorization.    Recommendations  Diet recommendations: Regular;Thin liquid Liquids provided via: Cup;Straw Medication Administration: Whole meds with puree Supervision: Patient able to self feed Postural Changes and/or Swallow Maneuvers: Seated upright 90 degrees                Oral Care Recommendations: Oral care BID Follow Up Recommendations: Acute inpatient rehab (3hours/day) Assistance recommended at discharge: Intermittent Supervision/Assistance SLP Visit Diagnosis: Aphasia (R47.01);Dysarthria and anarthria  (R47.1) Plan: Continue with current plan of care         Tamara Sethi L. Samson Frederic, MA CCC/SLP Acute Rehabilitation Services Office number [phone] Pager [phone]   Tamara Bullock  03/04/2022, 4:03 PM

## 2022-03-04 NOTE — Assessment & Plan Note (Signed)
Family have asked to continue home Ativan twice daily as needed which is an unsafe medication for this patient and is associated with memory loss. - Continue sertraline and mirtazapine

## 2022-03-04 NOTE — Assessment & Plan Note (Signed)
Resolved

## 2022-03-05 ENCOUNTER — Other Ambulatory Visit: Payer: Self-pay | Admitting: Cardiology

## 2022-03-05 DIAGNOSIS — F32A Depression, unspecified: Secondary | ICD-10-CM | POA: Diagnosis not present

## 2022-03-05 DIAGNOSIS — R338 Other retention of urine: Secondary | ICD-10-CM | POA: Diagnosis not present

## 2022-03-05 DIAGNOSIS — I639 Cerebral infarction, unspecified: Secondary | ICD-10-CM

## 2022-03-05 DIAGNOSIS — I4891 Unspecified atrial fibrillation: Secondary | ICD-10-CM

## 2022-03-05 DIAGNOSIS — I5022 Chronic systolic (congestive) heart failure: Secondary | ICD-10-CM | POA: Diagnosis not present

## 2022-03-05 MED ORDER — LOSARTAN POTASSIUM 50 MG PO TABS
50.0000 mg | ORAL_TABLET | Freq: Every day | ORAL | Status: DC
Start: 2022-03-05 — End: 2022-03-05

## 2022-03-05 MED ORDER — CARVEDILOL 6.25 MG PO TABS: 6.2500 mg | ORAL_TABLET | Freq: Two times a day (BID) | ORAL | Status: DC

## 2022-03-05 MED ORDER — TORSEMIDE 20 MG PO TABS
40.0000 mg | ORAL_TABLET | Freq: Every day | ORAL | Status: DC
  Administered 2022-03-06 – 2022-03-07 (×2): 40 mg via ORAL
  Filled 2022-03-05 (×2): qty 2

## 2022-03-05 MED ORDER — CARVEDILOL 3.125 MG PO TABS
3.1250 mg | ORAL_TABLET | Freq: Two times a day (BID) | ORAL | Status: DC
  Administered 2022-03-05 – 2022-03-07 (×5): 3.125 mg via ORAL
  Filled 2022-03-05 (×5): qty 1

## 2022-03-05 MED ORDER — LOSARTAN POTASSIUM 25 MG PO TABS
12.5000 mg | ORAL_TABLET | Freq: Every day | ORAL | Status: DC
  Administered 2022-03-05 – 2022-03-07 (×3): 12.5 mg via ORAL
  Filled 2022-03-05 (×3): qty 1

## 2022-03-05 NOTE — Progress Notes (Signed)
Progress Note   Patient: Tamara Bullock [id number] DOB: 1941/09/26 DOA: 03/02/2022     2 DOS: the patient was seen and examined on 03/05/2022 at 11:05AM      Brief hospital course: Tamara Bullock is an 81 y.o. F with HTN, depression, CKD IIIa who presented with sudden onset aphasia.  In the ER, SBP >200 mmHg, outside window for tPA, right facial droop and weakness.  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    6/2: Admitted, MRI shows right BG stroke 6/3: Echo shows new EF 30-35%, Neurology suspect embolism, recommend ILR prior to d/c; PT recommend CIR 6.4: Cardiology consulted for reduced EF     Assessment and Plan: * Acute ischemic stroke ([name]) Non-invasive angiography showed <50% carotids and no significant intracranial stenoses  Echocardiogram showed no cardiogenic source of embolism, note reduced EF  Lipids ordered, aspirin ordered at admission, Afib suspected but none observed, will plan for ILR, tPA not given because presented outside window, dysphagia screen ordered in ER, PT ordered, nonsmoker.  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  Evidently, patient screened with MMSE by PCP and seemed normal, I see no records of neuropsychiatric testing (although family believe she had this).  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  Chronic systolic CHF (congestive heart failure) ([name]) Care everywhere shows EF 60% last fall.  Echocardiogram here shows EF now reduced to 30-35%. Cardiology recommend: - ILR prior to d/c - Outpatient stress test - Resume Coreg monitor HR - Hold amiloride and losartan until  BP allows  - Continue aspirin, atorvastatin - Resume torsemide tomorrow   Acute urinary retention Resolved  Hypothyroidism - Continue levothyroxine  Depression Family have asked to continue home Ativan twice daily as needed which is an unsafe medication for this patient and is associated with memory loss. - Continue sertraline and mirtazapine   Stage 3a chronic kidney disease (CKD) (HCC) Creatinine stable relative to baseline  Essential hypertension BP controlled   - Continue losartan, carvedilol - Hold home amiloride, torsemide - Stop clonidine           Subjective: No new complaints, feeling somewhat better.  Still dysarthric, no dyspnea, palpitations.  Swelling.     Physical Exam: Vitals:   03/04/22 1605 03/05/22 0506 03/05/22 0835 03/05/22 1232  BP: (!) 153/111 (!) 159/87 (!) 142/69 (!) 140/53  Pulse:  68 63 72  Resp: (!) 24 (!) 21 17 20   Temp: 98 F (36.7 C) 97.7 F (36.5 C) 98 F (36.7 C) 98.3 F (36.8 C)  TempSrc: Oral Oral Axillary Axillary  SpO2:  100% 98% 100%  Weight:       Elderly adult female, lying in bed, no acute distress, interactive RRR, no murmurs, no pitting lower extremity edema Respiratory rate normal, lungs clear without rales or wheezes Still has right facial droop and mild right-sided weakness, still with dysarthria, follows commands appropriately, attention seems normal.      Data Reviewed: Discussed with neurology, cardiology notes reviewed, nursing notes reviewed No new labs  Family Communication: daughter at the bedside    Disposition: Status is: Inpatient The patient was admitted for stroke.  Her work-up is complete  she will need loop recorder prior to discharge.  CIR is pending        Author: Edwin Dada, MD 03/05/2022 2:32 PM  For on call review [email or web address].

## 2022-03-05 NOTE — Progress Notes (Signed)
Ordered 30 day event monitor to be read by Dr. Harl Bowie

## 2022-03-05 NOTE — Progress Notes (Signed)
Physical Therapy Treatment Patient Details Name: Tamara Bullock MRN: [id number] DOB: [birth year] Today's Date: 03/05/2022   History of Present Illness Tamara Bullock is a 81 y.o. female admitted 6/2  to [facility] ED from home via EMS due to sudden onset expressive aphasia.  Associated with right sided weakness.  Last known well was 03/02/22 around 11 AM.  Per EMS her SBP was >200 on arrival.  Presented outside of window for TPA.  Upon presentation to the ED, she was noted to be aphasic with decrease grip strength on the right.  CT head negative for any acute intracranial findings.  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 PMH:  generalized anxiety, essential hypertension, CKD 3B    PT Comments    Pt received on BSC. Assisted with pericare following BM. Pt required mod assist sit to stand, mod assist SPT, and mod assist ambulation 10' with RW. Pt fatigued due to extended time on BSC for BM. Practiced sit to stands from recliner. Pt in recliner with feet elevated at end of session.    Recommendations for follow up therapy are one component of a multi-disciplinary discharge planning process, led by the attending physician.  Recommendations may be updated based on patient status, additional functional criteria and insurance authorization.  Follow Up Recommendations  Acute inpatient rehab (3hours/day)     Assistance Recommended at Discharge Frequent or constant Supervision/Assistance  Patient can return home with the following A lot of help with walking and/or transfers;A lot of help with bathing/dressing/bathroom;Direct supervision/assist for medications management;Direct supervision/assist for financial management;Assistance with cooking/housework;Assist for transportation;Help with stairs or ramp for entrance   Equipment Recommendations  Rolling walker (2 wheels)    Recommendations for Other Services       Precautions / Restrictions Precautions Precautions: Fall     Mobility  Bed  Mobility               General bed mobility comments: Pt received on BSC.    Transfers Overall transfer level: Needs assistance Equipment used: Rolling walker (2 wheels) Transfers: Sit to/from Stand, Bed to chair/wheelchair/BSC Sit to Stand: Mod assist   Step pivot transfers: Mod assist       General transfer comment: cues for hand placement and sequencing. Assist to power up and stabilize balance. Pt attempting to sit too early.    Ambulation/Gait Ambulation/Gait assistance: Mod assist Gait Distance (Feet): 10 Feet Assistive device: Rolling walker (2 wheels) Gait Pattern/deviations: Decreased stride length, Trunk flexed, Leaning posteriorly, Shuffle, Step-through pattern Gait velocity: decreased Gait velocity interpretation: <1.8 ft/sec, indicate of risk for recurrent falls   General Gait Details: Poor balance reactions. Leaning posteriorly. Distance limited by fatigue.   Stairs             Wheelchair Mobility    Modified Rankin (Stroke Patients Only) Modified Rankin (Stroke Patients Only) Pre-Morbid Rankin Score: Slight disability Modified Rankin: Moderately severe disability     Balance Overall balance assessment: Needs assistance Sitting-balance support: No upper extremity supported, Feet supported Sitting balance-Leahy Scale: Fair   Postural control: Posterior lean Standing balance support: Bilateral upper extremity supported, During functional activity, Reliant on assistive device for balance Standing balance-Leahy Scale: Poor                              Cognition Arousal/Alertness: Awake/alert Behavior During Therapy: Flat affect (mildly labile) Overall Cognitive Status: History of cognitive impairments - at baseline  General Comments: dementia at baseline        Exercises      General Comments General comments (skin integrity, edema, etc.): VSS on RA      Pertinent  Vitals/Pain Pain Assessment Pain Assessment: No/denies pain    Home Living                          Prior Function            PT Goals (current goals can now be found in the care plan section) Acute Rehab PT Goals Patient Stated Goal: home Progress towards PT goals: Progressing toward goals    Frequency    Min 4X/week      PT Plan Current plan remains appropriate    Co-evaluation              AM-PAC PT "6 Clicks" Mobility   Outcome Measure  Help needed turning from your back to your side while in a flat bed without using bedrails?: A Little Help needed moving from lying on your back to sitting on the side of a flat bed without using bedrails?: A Little Help needed moving to and from a bed to a chair (including a wheelchair)?: A Lot Help needed standing up from a chair using your arms (e.g., wheelchair or bedside chair)?: A Lot Help needed to walk in hospital room?: A Lot Help needed climbing 3-5 steps with a railing? : Total 6 Click Score: 13    End of Session Equipment Utilized During Treatment: Gait belt Activity Tolerance: Patient limited by fatigue Patient left: in chair;with call bell/phone within reach;with family/visitor present Nurse Communication: Mobility status PT Visit Diagnosis: Unsteadiness on feet (R26.81);Muscle weakness (generalized) (M62.81)     Time: [id number] PT Time Calculation (min) (ACUTE ONLY): 24 min  Charges:  $Gait Training: 8-22 mins $Therapeutic Activity: 8-22 mins                     Lorrin Goodell, PT  Office # [phone] Pager [phone]    Lorriane Shire 03/05/2022, 9:17 AM

## 2022-03-05 NOTE — Progress Notes (Signed)
Occupational Therapy Treatment Patient Details Name: Tamara Bullock MRN: [id number] DOB: 19-Mar-1941 Today's Date: 03/05/2022   History of present illness Tamara Bullock is a 81 y.o. female admitted 6/2  [facility] ED from home via EMS due to sudden onset expressive aphasia.  Associated with right sided weakness.  Last known well was 03/02/22 around 11 AM.  Per EMS her SBP was >200 on arrival.  Presented outside of window for TPA.  Upon presentation to the ED, she was noted to be aphasic with decrease grip strength on the right.  CT head negative for any acute intracranial findings.  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 PMH:  generalized anxiety, essential hypertension, CKD 3B   OT comments  Pt progressing towards acute OT goals. Pt and family report pt has been OOB to chair this morning and completed multiple transfers (recliner, BSC). Pt recently back in bed from toileting but agreeable to therapy. Pt able to sidestep along EOB and walk in place utilizing rw and min-mod A. Aphasia noted. Pt + lightheadedness with positional change, improved with supine and time. Son and daughter present. Continue to recommend AIR at d/c.    Recommendations for follow up therapy are one component of a multi-disciplinary discharge planning process, led by the attending physician.  Recommendations may be updated based on patient status, additional functional criteria and insurance authorization.    Follow Up Recommendations  Acute inpatient rehab (3hours/day)    Assistance Recommended at Discharge Frequent or constant Supervision/Assistance  Patient can return home with the following  A lot of help with bathing/dressing/bathroom;A lot of help with walking and/or transfers;Assistance with feeding;Help with stairs or ramp for entrance;Assist for transportation;Assistance with cooking/housework;Direct supervision/assist for financial management;Direct supervision/assist for medications management   Equipment  Recommendations  BSC/3in1    Recommendations for Other Services      Precautions / Restrictions Precautions Precautions: Fall Restrictions Weight Bearing Restrictions: No       Mobility Bed Mobility Overal bed mobility: Needs Assistance Bed Mobility: Supine to Sit, Sit to Supine     Supine to sit: Min guard, HOB elevated Sit to supine: Min guard   General bed mobility comments: close min guard, extra time and effort to come to EOB position. no physical assist needed this session.    Transfers Overall transfer level: Needs assistance Equipment used: Rolling walker (2 wheels) Transfers: Sit to/from Stand Sit to Stand: Mod assist, Min assist           General transfer comment: Mod A on initial stand, min A to sit. utilized rw. multimodal cues for rw technique. Pt prefering to pull up on rw so assist for stabilizing rw.     Balance Overall balance assessment: Needs assistance Sitting-balance support: No upper extremity supported, Feet supported Sitting balance-Leahy Scale: Fair Sitting balance - Comments: reliant on UE support. Initial right lean, able to self correct with time and effort. Postural control: Posterior lean Standing balance support: Bilateral upper extremity supported, During functional activity, Reliant on assistive device for balance Standing balance-Leahy Scale: Poor Standing balance comment: rw and increased steadying assist for dynamic standing tasks                           ADL either performed or assessed with clinical judgement   ADL Overall ADL's : Needs assistance/impaired  Functional mobility during ADLs: Minimal assistance;Rolling walker (2 wheels) (side stepped along EOB) General ADL Comments: Cues for inititation. extra time and effort with bed mobility, cues for technique but no physical assist needed. Min - mod A to stand and sidestep towards right. +lightheadedness.     Extremity/Trunk Assessment Upper Extremity Assessment Upper Extremity Assessment: RUE deficits/detail;LUE deficits/detail RUE Deficits / Details: 2+/5 should AROM, 3+/5 elbow distal. loosely gripping rw RUE Sensation: decreased light touch RUE Coordination: decreased fine motor;decreased gross motor LUE Sensation: WNL LUE Coordination: decreased fine motor;decreased gross motor   Lower Extremity Assessment Lower Extremity Assessment: Defer to PT evaluation        Vision   Vision Assessment?: No apparent visual deficits   Perception Perception Perception: Within Functional Limits   Praxis      Cognition Arousal/Alertness: Awake/alert Behavior During Therapy: Flat affect Overall Cognitive Status: History of cognitive impairments - at baseline                                 General Comments: dementia at baseline        Exercises      Shoulder Instructions       General Comments Son and daughter present.    Pertinent Vitals/ Pain       Pain Assessment Pain Assessment: No/denies pain  Home Living                                          Prior Functioning/Environment              Frequency  Min 2X/week        Progress Toward Goals  OT Goals(current goals can now be found in the care plan section)  Progress towards OT goals: Progressing toward goals  Acute Rehab OT Goals Patient Stated Goal: Get stronger and go home OT Goal Formulation: With patient Time For Goal Achievement: 03/16/22 Potential to Achieve Goals: Good ADL Goals Pt Will Perform Grooming: with supervision;sitting Pt Will Perform Upper Body Dressing: with supervision;sitting Pt Will Perform Lower Body Dressing: with min assist;sit to/from stand Pt Will Transfer to Toilet: with min assist;ambulating;regular height toilet Pt Will Perform Toileting - Clothing Manipulation and hygiene: with supervision;sitting/lateral leans Pt/caregiver will Perform  Home Exercise Program: Increased strength;Both right and left upper extremity;With theraband;With Supervision  Plan Discharge plan remains appropriate    Co-evaluation                 AM-PAC OT "6 Clicks" Daily Activity     Outcome Measure   Help from another person eating meals?: A Little Help from another person taking care of personal grooming?: A Little Help from another person toileting, which includes using toliet, bedpan, or urinal?: A Lot Help from another person bathing (including washing, rinsing, drying)?: A Lot Help from another person to put on and taking off regular upper body clothing?: A Lot Help from another person to put on and taking off regular lower body clothing?: A Lot 6 Click Score: 14    End of Session Equipment Utilized During Treatment: Rolling walker (2 wheels)  OT Visit Diagnosis: Unsteadiness on feet (R26.81);Other abnormalities of gait and mobility (R26.89);Muscle weakness (generalized) (M62.81);History of falling (Z91.81);Other symptoms and signs involving cognitive function;Hemiplegia and hemiparesis Hemiplegia - Right/Left: Right Hemiplegia - dominant/non-dominant: Dominant Hemiplegia - caused by:  Unspecified   Activity Tolerance Patient tolerated treatment well   Patient Left in bed;with call bell/phone within reach;with family/visitor present;with bed alarm set   Nurse Communication          Time: 9629-5284 OT Time Calculation (min): 19 min  Charges: OT General Charges $OT Visit: 1 Visit OT Treatments $Self Care/Home Management : 8-22 mins  Raynald Kemp, OT Acute Rehabilitation Services Office: [phone]   Pilar Grammes 03/05/2022, 1:51 PM

## 2022-03-05 NOTE — Plan of Care (Addendum)
See recommendations from consult note, no changes. We will arrange for a 30 day cardiac monitor and cardiology follow up. Can gradually uptitrate GDMT per neurology recommendations post stroke. Otherwise she is stable from a cardiac standpoint and we will sign off.

## 2022-03-05 NOTE — Progress Notes (Signed)
Inpatient Rehab Admissions Coordinator:  Saw pt and family at bedside. Informed them that will begin insurance authorization. Will continue to follow.  Wolfgang Phoenix, MS, CCC-SLP Admissions Coordinator [phone]

## 2022-03-05 NOTE — Progress Notes (Incomplete)
PMR Admission Coordinator Pre-Admission Assessment   Patient: Tamara Bullock is an 81 y.o., female MRN: [id number] DOB: [birth year] Height:   Weight: 63.1 kg   Insurance Information HMO: yes    PPO:      PCP:      IPA:      80/20:      OTHER:  PRIMARY: BCBS Medicare      Policy#: [id number]      Subscriber: patient CM Name: ***      Phone#: ***     Fax#: [phone] Pre-Cert#: ***      Employer:  Benefits:  Phone #: [phone]     Name:  Eff. Date: 10/01/21     Deduct: does not have one Out of Pocket Max: $3,950 ($35 met)      Life Max: NA CIR: $335/day co=pay for days 1-5      SNF: 100% coverage for days 1-20, $196/day co-pay for days 21-60, 100% coverage for days 61-100 Outpatient: $10 co-pay/visit     Co-Pay:  Home Health: 100% coverage      Co-Pay:  DME: 100% coverage     Co-Pay:  Providers: in-network SECONDARY:       Policy#:     Phone#:    Development worker, community:       Phone#:    The Engineer, petroleum" for patients in Inpatient Rehabilitation Facilities with attached "Privacy Act [facility] Records" was provided and verbally reviewed with: {CHL IP Patient Family PI:[id number]}   Emergency Contact Information Contact Information       Name Relation Home Work Mobile    Fullam,BOBBY   [phone]               Current Medical History  Patient Admitting Diagnosis: CVA History of Present Illness: Pt is an 81 year old female with medical hx significant for: CKD 3B, essential HTN, generalized anxiety. Pt presented to [facility] on 03/02[facility]22 d/t right-sided facial droop, right arm weakness, right leg weakness, and some aphasia. Code stroke activated in ED. Pt not a candidate for tPA. CT head negative for acute intracranial abnormality. MRI brain showed acute ischemic stroke in left basal ganglia. MRA head negative for LVO; showed mild atherosclerotic changes at both bifurcations. 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  RV function was normal with only trivial MR and no significant valve abnormalities. Therapy evaluations completed and CIR recommended d/t pt's deficits in functional mobility and ability to complete ADLs independently.    Complete NIHSS TOTAL: 4   Patient's medical record from [facility] has been reviewed by the rehabilitation admission coordinator and physician.   Past Medical History  History reviewed. No pertinent past medical history.   Has the patient had major surgery during 100 days prior to admission? No   Family History   family history includes Heart attack in her father; Stroke in her mother.   Current Medications   Current Facility-Administered Medications:    aspirin EC tablet 81 mg, 81 mg, Oral, Daily, Kayleen Memos, DO, 81 mg at 03/04/22 0936   atorvastatin (LIPITOR) tablet 80 mg, 80 mg, Oral, Daily, Kayleen Memos, DO, 80 mg at 03/04/22 3235   clopidogrel (PLAVIX) tablet 75 mg, 75 mg, Oral, Daily, Hall, Carole N, DO, 75 mg at 03/04/22 0937   enoxaparin (LOVENOX) injection 40 mg, 40 mg, Subcutaneous, Daily, Hall, Carole N, DO, 40 mg at 03/04/22 0936   labetalol (NORMODYNE) injection 5 mg, 5 mg,  Intravenous, Q2H PRN, Irene Pap N, DO   levothyroxine (SYNTHROID) tablet 50 mcg, 50 mcg, Oral, Daily, Irene Pap N, DO, 50 mcg at 03/04/22 0534   LORazepam (ATIVAN) tablet 0.5 mg, 0.5 mg, Oral, BID PRN, Edwin Dada, MD, 0.5 mg at 03/04/22 2585   Patients Current Diet:  Diet Order                  Diet regular Room service appropriate? Yes with Assist; Fluid consistency: Thin  Diet effective now                         Precautions / Restrictions Precautions Precautions: Fall Restrictions Weight Bearing Restrictions: No    Has the patient had 2 or more falls or a fall with injury in the past year? Yes   Prior Activity Level Limited Community (1-2x/wk): would go to medical appointments and the grocery store   Prior Functional Level Self  Care: Did the patient need help bathing, dressing, using the toilet or eating? Independent   Indoor Mobility: Did the patient need assistance with walking from room to room (with or without device)? Independent   Stairs: Did the patient need assistance with internal or external stairs (with or without device)? Independent   Functional Cognition: Did the patient need help planning regular tasks such as shopping or remembering to take medications? Needed some help   Patient Information   Patient's Response To:    Home Assistive Devices / Equipment Home Assistive Devices/Equipment: None Home Equipment: Rollator (4 wheels)   Prior Device Use: Indicate devices/aids used by the patient prior to current illness, exacerbation or injury? Walker   Current Functional Level Cognition   Arousal/Alertness: Awake/alert Overall Cognitive Status: History of cognitive impairments - at baseline Orientation Level: Oriented to person, Oriented to place, Disoriented to time, Disoriented to situation Safety/Judgement: Decreased awareness of deficits, Decreased awareness of safety General Comments: family reports that pt has dementia Attention: Sustained Sustained Attention: Appears intact Awareness: Impaired Awareness Impairment: Intellectual impairment    Extremity Assessment (includes Sensation/Coordination)   Upper Extremity Assessment: RUE deficits/detail, LUE deficits/detail RUE Deficits / Details: 2+/5 should AROM, 3+/5 elbow distal RUE Sensation: decreased light touch RUE Coordination: decreased fine motor, decreased gross motor LUE Sensation: WNL LUE Coordination: decreased fine motor, decreased gross motor  Lower Extremity Assessment: Defer to PT evaluation RLE Deficits / Details: grossly 3+/5     ADLs   Grooming: Wash/dry hands, Wash/dry face, Minimal assistance, Sitting Upper Body Bathing: Moderate assistance, Sitting Lower Body Bathing: Maximal assistance, Sit to/from stand Upper  Body Dressing : Moderate assistance, Sitting Lower Body Dressing: Maximal assistance, Sit to/from stand Toilet Transfer: Minimal assistance, +2 for physical assistance     Mobility   Overal bed mobility: Needs Assistance Bed Mobility: Sit to Supine Supine to sit: Min guard, Min assist Sit to supine: Min guard General bed mobility comments: Took incr time for pt to sit up.  Needed assist to scoot to [facility] of stretcher.     Transfers   Overall transfer level: Needs assistance Equipment used: 2 person hand held assist Transfers: Sit to/from Stand Sit to Stand: Mod assist General transfer comment: Pt needed steadying assist as pt with right lean and posterior bias.  Relies on UE support bilaterally.     Ambulation / Gait / Stairs / Wheelchair Mobility   Ambulation/Gait Ambulation/Gait assistance: Mod assist, +2 physical assistance, Min assist Gait Distance (Feet): 20 Feet Assistive device: 2 person  hand held assist Gait Pattern/deviations: Decreased stride length, Step-through pattern, Shuffle, Leaning posteriorly, Staggering left, Staggering right, Trunk flexed, Narrow base of support General Gait Details: Pt unsteady and requires +2 mod assist for balance.  Pt unsteady and leans right as well as posteriorly at times. Requires bil UE support.  Poor balance reactions.  Pt with slightly flexed posture as well. Gait velocity interpretation: <1.31 ft/sec, indicative of household ambulator     Posture / Balance Dynamic Sitting Balance Sitting balance - Comments: relies on UE support.  Pt with right lean and needed cues or assist to right self to midline.  Min assist overall for sitting balance. Balance Overall balance assessment: Needs assistance Sitting-balance support: Feet supported, Bilateral upper extremity supported, No upper extremity supported Sitting balance-Leahy Scale: Poor Sitting balance - Comments: relies on UE support.  Pt with right lean and needed cues or assist to right self  to midline.  Min assist overall for sitting balance. Postural control: Right lateral lean, Posterior lean Standing balance support: Bilateral upper extremity supported, During functional activity Standing balance-Leahy Scale: Poor Standing balance comment: relies on UE support bilaterally for static stance with posterior and right lean.  Pt needs mod assist of 2 for dynamic tasks.     Special needs/care consideration      Previous Home Environment (from acute therapy documentation) Living Arrangements: Spouse/significant other  Lives With: Spouse Available Help at Discharge: Family, Available PRN/intermittently Type of Home: House Home Layout: One level Home Access: Stairs to enter Entrance Stairs-Rails: Right, Left Entrance Stairs-Number of Steps: 5 Bathroom Shower/Tub: Gaffer, Chiropodist: Standard Bathroom Accessibility: Yes How Accessible: Accessible via walker Gustine: No Additional Comments: pt helped husband last  weeks or so with B/D and clean him as well as help him get up as he recently broke elbpow aftger jos radoatopm complete.   Discharge Living Setting Plans for Discharge Living Setting: Patient's home Type of Home at Discharge: House Discharge Home Layout: One level Discharge Home Access: Stairs to enter Entrance Stairs-Rails: Right, Left Entrance Stairs-Number of Steps: 5 Discharge Bathroom Shower/Tub: Walk-in shower, Tub/shower unit Discharge Bathroom Toilet: Standard Discharge Bathroom Accessibility: Yes How Accessible: Accessible via walker Does the patient have any problems obtaining your medications?: No   Social/Family/Support Systems Patient Roles: Spouse Anticipated Caregiver: Olene Floss Anticipated Caregiver's Contact Information: Mortimer Fries: [phone] Discharge Plan Discussed with Primary Caregiver: Yes Is Caregiver In Agreement with Plan?: Yes Does Caregiver/Family have Issues with  Lodging/Transportation while Pt is in Rehab?: No   Goals Patient/Family Goal for Rehab: Supervision: PT/OT; Mod I-Supervision: ST Expected length of stay: 12-14 days Pt/Family Agrees to Admission and willing to participate: Yes Program Orientation Provided & Reviewed with Pt/Caregiver Including Roles  & Responsibilities: Yes   Decrease burden of Care through IP rehab admission: NA   Possible need for SNF placement upon discharge: Not anticipated   Patient Condition: I have reviewed medical records from [facility], spoken with CM, and patient, son, and daughter. I met with patient at the bedside for inpatient rehabilitation assessment.  Patient will benefit from ongoing PT, OT, and SLP, can actively participate in 3 hours of therapy a day 5 days of the week, and can make measurable gains during the admission.  Patient will also benefit from the coordinated team approach during an Inpatient Acute Rehabilitation admission.  The patient will receive intensive therapy as well as Rehabilitation physician, nursing, social worker, and care management interventions.  Due to safety, disease  management, medication administration, pain management, and patient education the patient requires 24 hour a day rehabilitation nursing.  The patient is currently Mod A with mobility and Mod-Max A with basic ADLs.  Discharge setting and therapy post discharge at home with home health is anticipated.  Patient has agreed to participate in the Acute Inpatient Rehabilitation Program and will admit {Time; today/tomorrow:10263}.   Preadmission Screen Completed By:  Bethel Born, 03/04/2022 10:45 AM ______________________________________________________________________

## 2022-03-06 MED ORDER — LORAZEPAM 0.5 MG PO TABS
0.2500 mg | ORAL_TABLET | Freq: Two times a day (BID) | ORAL | Status: DC | PRN
Start: 2022-03-06 — End: 2022-03-07
  Administered 2022-03-06 – 2022-03-07 (×2): 0.25 mg via ORAL
  Filled 2022-03-06 (×2): qty 1

## 2022-03-06 NOTE — Progress Notes (Signed)
Speech Language Pathology Treatment: Dysphagia;Cognitive-Linquistic  Patient Details Name: Tamara Bullock MRN: [id number] DOB: May 26, 1941 Today's Date: 03/06/2022 Time: [id number] SLP Time Calculation (min) (ACUTE ONLY): 42 min  Assessment / Plan / Recommendation Clinical Impression  Tamara Bullock is making steady gains toward language/speech goals.  Speech/language tasks progressed from simple, automatic (counting, DOW) to category naming then task sequencing.  Pt required progressively increased cueing as language demands increased.  She was able to repeat and refine clarity of sounds as well as specificity of language with verbal cues and modeling.    Mastication and oral control is also better today. There was no anterior spillage with solids/liquids and self feeding with LUE was more spontaneous and functional.  Pt had many questions about AIR and voiced concerns about delaying her return home to her husband. Provided encouragement and reiterated benefits of Tamara Bullock taking the time to focus on her healing rather than going home prematurely.  She agreed with plan.    HPI HPI: Tamara Bullock is a 81 y.o. female who presented to [facility] ED from home via EMS due to sudden onset expressive aphasia and  right sided weakness.  Prior medical history significant for generalized anxiety, essential hypertension.  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      SLP Plan  Continue with current plan of care      Recommendations for follow up therapy are one component of a multi-disciplinary discharge planning process, led by the attending physician.  Recommendations may be updated based on patient status, additional functional criteria and insurance authorization.    Recommendations  Diet recommendations: Regular;Thin liquid Liquids provided via: Cup;Straw Medication Administration: Whole meds with puree Supervision: Patient able to self feed Postural Changes and/or Swallow Maneuvers:  Seated upright 90 degrees                Oral Care Recommendations: Oral care BID Follow Up Recommendations: Acute inpatient rehab (3hours/day) Assistance recommended at discharge: Intermittent Supervision/Assistance SLP Visit Diagnosis: Aphasia (R47.01);Dysarthria and anarthria (R47.1) Plan: Continue with current plan of care         Aadyn Buchheit L. Tivis Ringer, [place] CCC/SLP Acute Rehabilitation Services Office number [phone] Pager [phone]   [name]  03/06/2022, 10:21 AM

## 2022-03-06 NOTE — Progress Notes (Signed)
Progress Note   Patient: Tamara Bullock [id number] DOB: 02-Oct-1940 DOA: 03/02/2022     3 DOS: the patient was seen and examined on 03/06/2022 at 2:00PM      Brief hospital course: Tamara Bullock is an 81 y.o. F with HTN, depression, CKD IIIa who presented with sudden onset aphasia.  In the ER, SBP >200 mmHg, outside window for tPA, right facial droop and weakness.  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    6/2: Admitted, MRI shows right BG stroke 6/3: Echo shows new EF 30-35%, Neurology suspect embolism, recommend ILR prior to d/c; PT recommend CIR 6.4: Cardiology consulted for reduced EF     Assessment and Plan: * Acute ischemic stroke (Tamara Bullock) Non-invasive angiography showed <50% carotids and no significant intracranial stenoses  Echocardiogram showed no cardiogenic source of embolism, note reduced EF  Lipids ordered, aspirin ordered at admission, Afib suspected but none observed, will plan for ILR, tPA not given because presented outside window, dysphagia screen ordered in ER, PT ordered, nonsmoker.  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  Evidently, patient screened with MMSE by PCP and seemed normal, I see no records of neuropsychiatric testing (although family believe she had this).  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  Chronic systolic CHF (congestive heart failure) (Tamara Bullock) Care everywhere showed EF 60% last fall.  Echocardiogram here shows EF now reduced to 30-35%. - Hold home amiloride for now - Continue aspirin, atorvastatin - Continue torsemide, losartan  Cardiology recommend: - ILR  prior to d/c - Outpatient stress test - Resume Coreg monitor HR   Acute urinary retention Resolved  Hypothyroidism - Continue levothyroxine  Depression Family have asked to continue home Ativan twice daily as needed which is an unsafe medication for this patient and is associated with memory loss. - Continue sertraline and mirtazapine   Stage 3a chronic kidney disease (CKD) (HCC) Creatinine stable relative to baseline  Essential hypertension BP controlled   - Continue torsemide, carvedilol, losartan - Resume amiloride at d/c to SNF - Hold home amiloride  - Stop clonidine           Subjective: No complaints, still with dysarthria, still some imbalance and hemiparesis.     Physical Exam: Vitals:   03/06/22 0400 03/06/22 0449 03/06/22 0732 03/06/22 1129  BP: (!) 148/62  (!) 149/66 139/77  Pulse: 60  (!) 56 71  Resp: 16  20   Temp: 98.4 F (36.9 C)  97.9 F (36.6 C) 98.2 F (36.8 C)  TempSrc: Oral  Oral Oral  SpO2: 100%  100% 100%  Weight:  64.8 kg     Elderly adult female, sitting edge of bed, interactive, makes eye contact, answers questions appropriately, interactive RRR, no murmurs, no lower extremity edema Respiratory rate normal, lungs clear without rales or wheezes Right facial droop persists, she has some mild right-sided weakness, her dysarthria is fairly dense, she follows commands appropriately, attention seems normal  Data Reviewed: Cardiology notes reviewed, nursing notes reviewed No new labs  Family Communication: Daughter at the bedside    Disposition: Status is: Inpatient The patient was admitted with acute stroke.  She was also found to  have reduced EF.  Cardiology were consulted and her medical regimen was adjusted appropriately.  She is medically stable for discharge to CIR when bed available.  She will need an ILR when she goes, this has already been arranged with EP        Author: Edwin Dada, MD 03/06/2022  2:57 PM  For on call review [email or web address].

## 2022-03-06 NOTE — Progress Notes (Signed)
  Transition of Care [facility]) Screening Note   Patient Details  Name: Tamara Bullock Date of Birth: March 30, 1941   Transition of Care [facility]) CM/SW Contact:    Kermit Balo, RN Phone Number: 03/06/2022, 1:08 PM    Transition of Care Department [facility]) has reviewed patient. We will continue to monitor patient advancement through interdisciplinary progression rounds. Awaiting bed on CIR.

## 2022-03-06 NOTE — Progress Notes (Addendum)
Inpatient Rehab Admissions Coordinator:   Following for my colleague, Gayland Curry.  Insurance auth begun yesterday and we are awaiting a determination.   933: I just received insurance approval for CIR; however I do not have a bed available for this patient to admit today.  Will follow for potential admit pending bed availability.   Shann Medal, PT, DPT Admissions Coordinator [phone] 03/06/22  9:29 AM

## 2022-03-06 NOTE — Progress Notes (Signed)
Physical Therapy Treatment Patient Details Name: Tamara Bullock MRN: [id number] DOB: Mar 16, 1941 Today's Date: 03/06/2022   History of Present Illness Tamara Bullock is a 81 y.o. female admitted 6/2  to [facility] ED from home via EMS due to sudden onset expressive aphasia.  Associated with right sided weakness.  Last known well was 03/02/22 around 11 AM.  Per EMS her SBP was >200 on arrival.  Presented outside of window for TPA.  Upon presentation to the ED, she was noted to be aphasic with decrease grip strength on the right.  CT head negative for any acute intracranial findings.  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 PMH:  generalized anxiety, essential hypertension, CKD 3B    PT Comments    Progressing steadily toward goals.  Pt still would benefit from a shorter stent on AIR.  Emphasis on transitions, scooting, sit to stand safety/technique, progression of gait stability, stamina and safety with the RW.   Recommendations for follow up therapy are one component of a multi-disciplinary discharge planning process, led by the attending physician.  Recommendations may be updated based on patient status, additional functional criteria and insurance authorization.  Follow Up Recommendations  Acute inpatient rehab (3hours/day)     Assistance Recommended at Discharge Frequent or constant Supervision/Assistance  Patient can return home with the following A little help with walking and/or transfers;A little help with bathing/dressing/bathroom;Assistance with cooking/housework;Direct supervision/assist for medications management;Direct supervision/assist for financial management;Assist for transportation;Help with stairs or ramp for entrance   Equipment Recommendations  Rolling walker (2 wheels)    Recommendations for Other Services Rehab consult     Precautions / Restrictions Precautions Precautions: Fall     Mobility  Bed Mobility Overal bed mobility: Needs Assistance Bed Mobility:  Supine to Sit     Supine to sit: Min assist Sit to supine: Min guard   General bed mobility comments: extra time and use of rails    Transfers Overall transfer level: Needs assistance Equipment used: Rolling walker (2 wheels) Transfers: Sit to/from Stand Sit to Stand: Min assist           General transfer comment: initially min to both come forward and boost.    Ambulation/Gait Ambulation/Gait assistance: Min assist, Mod assist Gait Distance (Feet): 100 Feet Assistive device: Rolling walker (2 wheels) Gait Pattern/deviations: Step-through pattern, Decreased step length - right, Decreased step length - left, Decreased stride length Gait velocity: decreased Gait velocity interpretation: <1.31 ft/sec, indicative of household ambulator   General Gait Details: overall gait tentative with R list, assist for R hand on the RW, assist against R drift and running in the walls on the Right,  cues to maintain posture and proximity to the [employer]             Wheelchair Mobility    Modified Rankin (Stroke Patients Only) Modified Rankin (Stroke Patients Only) Pre-Morbid Rankin Score: Slight disability Modified Rankin: Moderately severe disability     Balance Overall balance assessment: Needs assistance Sitting-balance support: No upper extremity supported, Feet supported Sitting balance-Leahy Scale: Fair Sitting balance - Comments: better able to hold midline, but without challenge.   Standing balance support: Bilateral upper extremity supported, During functional activity, Reliant on assistive device for balance Standing balance-Leahy Scale: Poor Standing balance comment: reliant on AD or external support                            Cognition Arousal/Alertness:  Awake/alert Behavior During Therapy: Flat affect, WFL for tasks assessed/performed Overall Cognitive Status: History of cognitive impairments - at baseline Area of Impairment: Safety/judgement,  Awareness, Problem solving                         Safety/Judgement: Decreased awareness of safety, Decreased awareness of deficits Awareness: Emergent Problem Solving: Slow processing, Requires verbal cues          Exercises      General Comments        Pertinent Vitals/Pain Pain Assessment Pain Assessment: No/denies pain    Home Living                          Prior Function            PT Goals (current goals can now be found in the care plan section) Acute Rehab PT Goals Patient Stated Goal: home as soon as possible. PT Goal Formulation: With patient Time For Goal Achievement: 03/17/22 Potential to Achieve Goals: Good Progress towards PT goals: Progressing toward goals    Frequency    Min 4X/week      PT Plan Current plan remains appropriate    Co-evaluation              AM-PAC PT "6 Clicks" Mobility   Outcome Measure  Help needed turning from your back to your side while in a flat bed without using bedrails?: A Little Help needed moving from lying on your back to sitting on the side of a flat bed without using bedrails?: A Little Help needed moving to and from a bed to a chair (including a wheelchair)?: A Lot Help needed standing up from a chair using your arms (e.g., wheelchair or bedside chair)?: A Little Help needed to walk in hospital room?: A Lot Help needed climbing 3-5 steps with a railing? : A Lot 6 Click Score: 15    End of Session   Activity Tolerance: Patient limited by fatigue;Patient tolerated treatment well Patient left: in bed;with call bell/phone within reach;with family/visitor present;Other (comment) (sitting EOB) Nurse Communication: Mobility status PT Visit Diagnosis: Unsteadiness on feet (R26.81);Muscle weakness (generalized) (M62.81)     Time: [id number] PT Time Calculation (min) (ACUTE ONLY): 37 min  Charges:  $Gait Training: 8-22 mins $Therapeutic Activity: 8-22 mins                      03/06/2022  Ginger Carne., PT Acute Rehabilitation Services [phone]  (pager) [phone]  (office)   Tessie Fass Ean Gettel 03/06/2022, 5:03 PM

## 2022-03-07 ENCOUNTER — Inpatient Hospital Stay (HOSPITAL_COMMUNITY)
Admission: RE | Admit: 2022-03-07 | Discharge: 2022-03-17 | DRG: 057 | Disposition: A | Discharge status: Home Health | Payer: Medicare Other | Source: Intra-hospital | Attending: Physical Medicine & Rehabilitation | Admitting: Physical Medicine & Rehabilitation

## 2022-03-07 ENCOUNTER — Encounter (HOSPITAL_COMMUNITY): Payer: Self-pay | Admitting: Physical Medicine & Rehabilitation

## 2022-03-07 ENCOUNTER — Other Ambulatory Visit: Payer: Self-pay

## 2022-03-07 DIAGNOSIS — I69319 Unspecified symptoms and signs involving cognitive functions following cerebral infarction: Secondary | ICD-10-CM

## 2022-03-07 DIAGNOSIS — I10 Essential (primary) hypertension: Secondary | ICD-10-CM | POA: Diagnosis present

## 2022-03-07 DIAGNOSIS — F32A Depression, unspecified: Secondary | ICD-10-CM | POA: Diagnosis present

## 2022-03-07 DIAGNOSIS — I69390 Apraxia following cerebral infarction: Secondary | ICD-10-CM

## 2022-03-07 DIAGNOSIS — I509 Heart failure, unspecified: Secondary | ICD-10-CM

## 2022-03-07 DIAGNOSIS — I130 Hypertensive heart and chronic kidney disease with heart failure and stage 1 through stage 4 chronic kidney disease, or unspecified chronic kidney disease: Secondary | ICD-10-CM | POA: Diagnosis present

## 2022-03-07 DIAGNOSIS — E785 Hyperlipidemia, unspecified: Secondary | ICD-10-CM | POA: Diagnosis present

## 2022-03-07 DIAGNOSIS — Z881 Allergy status to other antibiotic agents status: Secondary | ICD-10-CM | POA: Diagnosis not present

## 2022-03-07 DIAGNOSIS — Z8249 Family history of ischemic heart disease and other diseases of the circulatory system: Secondary | ICD-10-CM | POA: Diagnosis not present

## 2022-03-07 DIAGNOSIS — Z7902 Long term (current) use of antithrombotics/antiplatelets: Secondary | ICD-10-CM

## 2022-03-07 DIAGNOSIS — R7303 Prediabetes: Secondary | ICD-10-CM | POA: Diagnosis present

## 2022-03-07 DIAGNOSIS — I69351 Hemiplegia and hemiparesis following cerebral infarction affecting right dominant side: Secondary | ICD-10-CM | POA: Diagnosis present

## 2022-03-07 DIAGNOSIS — I639 Cerebral infarction, unspecified: Secondary | ICD-10-CM | POA: Diagnosis present

## 2022-03-07 DIAGNOSIS — N1831 Chronic kidney disease, stage 3a: Secondary | ICD-10-CM | POA: Diagnosis present

## 2022-03-07 DIAGNOSIS — I5022 Chronic systolic (congestive) heart failure: Secondary | ICD-10-CM | POA: Diagnosis present

## 2022-03-07 DIAGNOSIS — Z882 Allergy status to sulfonamides status: Secondary | ICD-10-CM | POA: Diagnosis not present

## 2022-03-07 DIAGNOSIS — Z91048 Other nonmedicinal substance allergy status: Secondary | ICD-10-CM

## 2022-03-07 DIAGNOSIS — Z880 Allergy status to penicillin: Secondary | ICD-10-CM

## 2022-03-07 DIAGNOSIS — Z888 Allergy status to other drugs, medicaments and biological substances status: Secondary | ICD-10-CM

## 2022-03-07 DIAGNOSIS — Z79890 Hormone replacement therapy (postmenopausal): Secondary | ICD-10-CM

## 2022-03-07 DIAGNOSIS — R2689 Other abnormalities of gait and mobility: Secondary | ICD-10-CM | POA: Diagnosis present

## 2022-03-07 DIAGNOSIS — I69320 Aphasia following cerebral infarction: Secondary | ICD-10-CM

## 2022-03-07 DIAGNOSIS — E039 Hypothyroidism, unspecified: Secondary | ICD-10-CM | POA: Diagnosis present

## 2022-03-07 DIAGNOSIS — E876 Hypokalemia: Secondary | ICD-10-CM | POA: Diagnosis not present

## 2022-03-07 DIAGNOSIS — Z887 Allergy status to serum and vaccine status: Secondary | ICD-10-CM

## 2022-03-07 DIAGNOSIS — M545 Low back pain, unspecified: Secondary | ICD-10-CM

## 2022-03-07 DIAGNOSIS — I6381 Other cerebral infarction due to occlusion or stenosis of small artery: Secondary | ICD-10-CM | POA: Diagnosis present

## 2022-03-07 DIAGNOSIS — T501X5A Adverse effect of loop [high-ceiling] diuretics, initial encounter: Secondary | ICD-10-CM | POA: Diagnosis not present

## 2022-03-07 DIAGNOSIS — I69322 Dysarthria following cerebral infarction: Secondary | ICD-10-CM

## 2022-03-07 DIAGNOSIS — Z7982 Long term (current) use of aspirin: Secondary | ICD-10-CM

## 2022-03-07 DIAGNOSIS — F411 Generalized anxiety disorder: Secondary | ICD-10-CM | POA: Diagnosis not present

## 2022-03-07 DIAGNOSIS — G8929 Other chronic pain: Secondary | ICD-10-CM

## 2022-03-07 DIAGNOSIS — Z823 Family history of stroke: Secondary | ICD-10-CM

## 2022-03-07 DIAGNOSIS — Z886 Allergy status to analgesic agent status: Secondary | ICD-10-CM

## 2022-03-07 DIAGNOSIS — I69398 Other sequelae of cerebral infarction: Secondary | ICD-10-CM | POA: Diagnosis not present

## 2022-03-07 DIAGNOSIS — Z79899 Other long term (current) drug therapy: Secondary | ICD-10-CM

## 2022-03-07 LAB — BASIC METABOLIC PANEL
Anion gap: 11 (ref 5–15)
BUN: 18 mg/dL (ref 8–23)
CO2: 23 mmol/L (ref 22–32)
Calcium: 9.2 mg/dL (ref 8.9–10.3)
Chloride: 102 mmol/L (ref 98–111)
Creatinine, Ser: 1.19 mg/dL — ABNORMAL HIGH (ref 0.44–1.00)
GFR, Estimated: 46 mL/min — ABNORMAL LOW (ref >60)
Glucose, Bld: 104 mg/dL — ABNORMAL HIGH (ref 70–99)
Potassium: 3.5 mmol/L (ref 3.5–5.1)
Sodium: 136 mmol/L (ref 135–145)

## 2022-03-07 LAB — CBC
HCT: 36 % (ref 36.0–46.0)
Hemoglobin: 12.5 g/dL (ref 12.0–15.0)
MCH: 31.1 pg (ref 26.0–34.0)
MCHC: 34.7 g/dL (ref 30.0–36.0)
MCV: 89.6 fL (ref 80.0–100.0)
Platelets: 245 10*3/uL (ref 150–400)
RBC: 4.02 MIL/uL (ref 3.87–5.11)
RDW: 13 % (ref 11.5–15.5)
WBC: 7.4 10*3/uL (ref 4.0–10.5)
nRBC: 0 % (ref 0.0–0.2)

## 2022-03-07 MED ORDER — CARVEDILOL 3.125 MG PO TABS: 3.1250 mg | ORAL_TABLET | Freq: Two times a day (BID) | ORAL | Status: DC

## 2022-03-07 MED ORDER — FLEET ENEMA 7-19 GM/118ML RE ENEM
1.0000 | ENEMA | Freq: Once | RECTAL | Status: DC | PRN
Start: 2022-03-07 — End: 2022-03-17

## 2022-03-07 MED ORDER — ACETAMINOPHEN 325 MG PO TABS: 325.0000 mg | ORAL_TABLET | ORAL | Status: DC | PRN

## 2022-03-07 MED ORDER — CLOPIDOGREL BISULFATE 75 MG PO TABS: 75.0000 mg | ORAL_TABLET | Freq: Every day | ORAL | Status: DC

## 2022-03-07 MED ORDER — ASPIRIN 81 MG PO TBEC: 81.0000 mg | DELAYED_RELEASE_TABLET | Freq: Every day | ORAL | 0 refills | Status: DC

## 2022-03-07 MED ORDER — LOSARTAN POTASSIUM 25 MG PO TABS
12.5000 mg | ORAL_TABLET | Freq: Every day | ORAL | Status: DC
  Administered 2022-03-08 – 2022-03-17 (×10): 12.5 mg via ORAL
  Filled 2022-03-07 (×11): qty 0.5

## 2022-03-07 MED ORDER — LORAZEPAM 0.5 MG PO TABS: 0.2500 mg | ORAL_TABLET | Freq: Two times a day (BID) | ORAL | 0 refills | Status: DC | PRN

## 2022-03-07 MED ORDER — ASPIRIN 81 MG PO TBEC
81.0000 mg | DELAYED_RELEASE_TABLET | Freq: Every day | ORAL | Status: DC
  Administered 2022-03-08 – 2022-03-17 (×10): 81 mg via ORAL
  Filled 2022-03-07 (×10): qty 1

## 2022-03-07 MED ORDER — POLYETHYLENE GLYCOL 3350 17 G PO PACK
17.0000 g | PACK | Freq: Every day | ORAL | Status: DC | PRN
Start: 2022-03-07 — End: 2022-03-12

## 2022-03-07 MED ORDER — TORSEMIDE 20 MG PO TABS
40.0000 mg | ORAL_TABLET | Freq: Every day | ORAL | Status: DC
  Administered 2022-03-08 – 2022-03-17 (×10): 40 mg via ORAL
  Filled 2022-03-07 (×10): qty 2

## 2022-03-07 MED ORDER — ENOXAPARIN SODIUM 40 MG/0.4ML IJ SOSY
40.0000 mg | PREFILLED_SYRINGE | INTRAMUSCULAR | Status: DC
  Administered 2022-03-08 – 2022-03-17 (×10): 40 mg via SUBCUTANEOUS
  Filled 2022-03-07 (×10): qty 0.4

## 2022-03-07 MED ORDER — CLOPIDOGREL BISULFATE 75 MG PO TABS
75.0000 mg | ORAL_TABLET | Freq: Every day | ORAL | Status: DC
  Administered 2022-03-08 – 2022-03-17 (×10): 75 mg via ORAL
  Filled 2022-03-07 (×10): qty 1

## 2022-03-07 MED ORDER — LEVOTHYROXINE SODIUM 50 MCG PO TABS
50.0000 ug | ORAL_TABLET | Freq: Every day | ORAL | Status: DC
  Administered 2022-03-08 – 2022-03-17 (×10): 50 ug via ORAL
  Filled 2022-03-07 (×10): qty 1

## 2022-03-07 MED ORDER — ATORVASTATIN CALCIUM 80 MG PO TABS
80.0000 mg | ORAL_TABLET | Freq: Every day | ORAL | Status: DC
  Administered 2022-03-08 – 2022-03-15 (×8): 80 mg via ORAL
  Filled 2022-03-07 (×8): qty 1

## 2022-03-07 MED ORDER — DIPHENHYDRAMINE HCL 12.5 MG/5ML PO ELIX: 12.5000 mg | ORAL_SOLUTION | Freq: Four times a day (QID) | ORAL | Status: DC | PRN

## 2022-03-07 MED ORDER — PROCHLORPERAZINE MALEATE 5 MG PO TABS
5.0000 mg | ORAL_TABLET | Freq: Four times a day (QID) | ORAL | Status: DC | PRN
  Administered 2022-03-16: 5 mg via ORAL
  Filled 2022-03-07: qty 2

## 2022-03-07 MED ORDER — CARVEDILOL 3.125 MG PO TABS
3.1250 mg | ORAL_TABLET | Freq: Two times a day (BID) | ORAL | Status: DC
  Administered 2022-03-08 – 2022-03-17 (×19): 3.125 mg via ORAL
  Filled 2022-03-07 (×20): qty 1

## 2022-03-07 MED ORDER — GUAIFENESIN-DM 100-10 MG/5ML PO SYRP: 5.0000 mL | ORAL_SOLUTION | Freq: Four times a day (QID) | ORAL | Status: DC | PRN

## 2022-03-07 MED ORDER — BISACODYL 10 MG RE SUPP
10.0000 mg | Freq: Every day | RECTAL | Status: DC | PRN
Start: 2022-03-07 — End: 2022-03-17

## 2022-03-07 MED ORDER — PROCHLORPERAZINE EDISYLATE 10 MG/2ML IJ SOLN: 5.0000 mg | Freq: Four times a day (QID) | INTRAMUSCULAR | Status: DC | PRN

## 2022-03-07 MED ORDER — ATORVASTATIN CALCIUM 80 MG PO TABS: 80.0000 mg | ORAL_TABLET | Freq: Every day | ORAL | Status: DC

## 2022-03-07 MED ORDER — LORAZEPAM 0.5 MG PO TABS
0.2500 mg | ORAL_TABLET | Freq: Two times a day (BID) | ORAL | Status: DC | PRN
  Administered 2022-03-07 – 2022-03-17 (×10): 0.25 mg via ORAL
  Filled 2022-03-07 (×11): qty 1

## 2022-03-07 MED ORDER — SERTRALINE HCL 50 MG PO TABS
25.0000 mg | ORAL_TABLET | Freq: Every day | ORAL | Status: DC
  Administered 2022-03-08 – 2022-03-17 (×10): 25 mg via ORAL
  Filled 2022-03-07 (×11): qty 1

## 2022-03-07 MED ORDER — PROCHLORPERAZINE 25 MG RE SUPP: 12.5000 mg | Freq: Four times a day (QID) | RECTAL | Status: DC | PRN

## 2022-03-07 MED ORDER — ALUM & MAG HYDROXIDE-SIMETH 200-200-20 MG/5ML PO SUSP: 30.0000 mL | ORAL | Status: DC | PRN

## 2022-03-07 MED ORDER — BLOOD PRESSURE CONTROL BOOK
Freq: Once | Status: AC
  Filled 2022-03-07: qty 1

## 2022-03-07 MED ORDER — LOSARTAN POTASSIUM 25 MG PO TABS: 12.5000 mg | ORAL_TABLET | Freq: Every day | ORAL | Status: DC

## 2022-03-07 MED ORDER — ENOXAPARIN SODIUM 40 MG/0.4ML IJ SOSY: 40.0000 mg | PREFILLED_SYRINGE | INTRAMUSCULAR | Status: DC

## 2022-03-07 MED ORDER — MIRTAZAPINE 15 MG PO TABS
7.5000 mg | ORAL_TABLET | Freq: Every day | ORAL | Status: DC
  Administered 2022-03-07 – 2022-03-16 (×10): 7.5 mg via ORAL
  Filled 2022-03-07 (×10): qty 1

## 2022-03-07 MED ORDER — TRAZODONE HCL 50 MG PO TABS
25.0000 mg | ORAL_TABLET | Freq: Every evening | ORAL | Status: DC | PRN
  Administered 2022-03-08 – 2022-03-16 (×5): 50 mg via ORAL
  Filled 2022-03-07 (×6): qty 1

## 2022-03-07 NOTE — Care Management Important Message (Signed)
Important Message  Patient Details  Name: Tamara Bullock MRN: [id number] Date of Birth: 1941-07-22   Medicare Important Message Given:  Yes     Dorena Bodo 03/07/2022, 3:21 PM

## 2022-03-07 NOTE — Progress Notes (Signed)
Inpatient Rehabilitation Admission Medication Review by a Pharmacist  A complete drug regimen review was completed for this patient to identify any potential clinically significant medication issues.  High Risk Drug Classes Is patient taking? Indication by Medication  Antipsychotic No   Anticoagulant Yes Enoxaparin: DVT prophylaxis  Antibiotic No   Opioid No   Antiplatelet Yes Aspirin: Acute ischemic basal ganglia stroke: To complete total 21 days Clopidogrel: Acute ischemic basal ganglia stroke: Lifelong  Hypoglycemics/insulin No   Vasoactive Medication Yes Losartan: Blood pressure: HFrEF Coreg: BP; HFrEF  Chemotherapy No   Other Yes Lorazepam depression Mirtazepine depression Synthroid:Hypothyroidism Atorvastatin: Acute ischemic basal ganglia stroke Torsemide: HFrEF Sertraline depression Trazodone: PRN for insomnia as part of rehab admission order set     Type of Medication Issue Identified Description of Issue Recommendation(s)  Drug Interaction(s) (clinically significant)     Duplicate Therapy     Allergy     No Medication Administration End Date     Incorrect Dose     Additional Drug Therapy Needed     Significant med changes from prior encounter (inform family/care partners about these prior to discharge).    Other       Clinically significant medication issues were identified that warrant physician communication and completion of prescribed/recommended actions by midnight of the next day:  No  Name of provider notified for urgent issues identified: N/A  Provider Method of Notification: N/A    Pharmacist comments: Efforts to decrease polypharmacy noted.  Time spent performing this drug regimen review (minutes):  15 minutes   Cathie Hoops 03/07/2022 5:47 PM

## 2022-03-07 NOTE — H&P (Signed)
Physical Medicine and Rehabilitation Admission H&P    Chief Complaint  Patient presents with   Stroke with functional deficits.     HPI: Tamara Bullock. Tamara Bullock is an 81 year old RH female with history of HTN, CKD, GAD/depression, STM deficits in the past few months;  who was admitted on 03/02/22 with sudden onset of aphasia  and right sided weakness. MRI/MRA brain don revealing acute 2.5 cm left basal ganglia infarct with age related volume loss and no significant ICA stenosis. 2D echo done revealing EF 30-35% with global hypokinesis and  and noted to have sinus bradycardia with HR in upper 50's. Stroke felt to be embolic from cryptogenic source and Dr. Leonie Man recommended DAPT X 3 weeks followed by Plavix indefinitely. Cardiology consulted for ILR to rule out A fib. ST/PT/OT has been working with patient who is limited by anomia, expressive aphasia, dizziness with positional changes, right sided weakness with shuffling gait and balance deficits. CIR recommended due to functional decline. Daughter would like to stay overnight.   Review of Systems  HENT:  Negative for hearing loss.   Eyes:  Negative for blurred vision.  Respiratory:  Negative for shortness of breath.   Cardiovascular:  Negative for chest pain.  Gastrointestinal:  Negative for constipation and heartburn.  Musculoskeletal:  Positive for back pain.  Skin:  Negative for rash.  Neurological:  Positive for focal weakness. Negative for dizziness and headaches.  Psychiatric/Behavioral:  The patient is not nervous/anxious and does not have insomnia.     Past Medical History:  Diagnosis Date   Essential hypertension    Hyperlipidemia     History reviewed. No pertinent surgical history.   Family History  Problem Relation Age of Onset   Stroke Mother    Heart attack Father     Social History:  Married--husband in poor health and moved to [place] to be closer to family? She reports that she has never smoked. She has never been  exposed to tobacco smoke. She has never used smokeless tobacco. No history on file for alcohol use and drug use.   Allergies  Allergen Reactions   Penicillin G Hives   Sulfa Antibiotics Hives   Aciphex [Rabeprazole]    Amitriptyline    Aspirin    Azithromycin    Carbocaine [Mepivacaine]    Cimetidine    Ciprofloxacin    Cisapride    Clindamycin/Lincomycin    Cortisporin-Tc [Neomycin-Colist-Hc-Thonzonium]    Escitalopram    Hydrochlorothiazide    Hydrocodone    Ibuprofen    Iodine    Macrodantin [Nitrofurantoin]    Medroxyprogesterone    Meloxicam    Minocycline    Moxifloxacin     Chest pain    Nitrofuran Derivatives    Nsaids    Ofloxacin    Pantoprazole    Pneumococcal Vaccines    Prednisolone    Prednisone    Prevacid [Lansoprazole]    Propoxyphene    Soap    Spironolactone    Tobradex [Tobramycin-Dexamethasone]    Tramadol    Trolamine Salicylate    Verapamil    Voltaren [Diclofenac]    Medications Prior to Admission  Medication Sig Dispense Refill   aspirin EC 81 MG tablet Take 1 tablet (81 mg total) by mouth daily. Swallow whole. 16 tablet 0   atorvastatin (LIPITOR) 80 MG tablet Take 1 tablet (80 mg total) by mouth daily.     carvedilol (COREG) 3.125 MG tablet Take 1 tablet (3.125 mg total) by mouth  2 (two) times daily with a meal.     clopidogrel (PLAVIX) 75 MG tablet Take 1 tablet (75 mg total) by mouth daily.     EUTHYROX 50 MCG tablet Take 50 mcg by mouth daily.     LORazepam (ATIVAN) 0.5 MG tablet Take 0.5 tablets (0.25 mg total) by mouth 2 (two) times daily as needed for anxiety. 30 tablet 0   losartan (COZAAR) 25 MG tablet Take 0.5 tablets (12.5 mg total) by mouth daily.     mirtazapine (REMERON) 7.5 MG tablet Take 7.5 mg by mouth at bedtime.     sertraline (ZOLOFT) 25 MG tablet Take 25 mg by mouth daily.     Study - CAPTIVA - aspirin 81 mg tablet (PI-Sethi) Chew 1 tablet by mouth daily.     torsemide (DEMADEX) 20 MG tablet Take 40 mg by mouth  daily.     Home: Home Living Family/patient expects to be discharged to:: Private residence Living Arrangements: Spouse/significant other Available Help at Discharge: Family, Available PRN/intermittently Type of Home: House Home Access: Stairs to enter Technical brewer of Steps: 5 Entrance Stairs-Rails: Right, Left Home Layout: One level Bathroom Shower/Tub: Gaffer, Chiropodist: Standard Bathroom Accessibility: Yes Home Equipment: Rollator (4 wheels) Additional Comments: pt helped husband last  weeks or so with B/D and clean him as well as help him get up as he recently broke elbpow aftger jos radoatopm complete.  Lives With: Spouse   Functional History: Prior Function Prior Level of Function : Needs assist, History of Falls (last six months), Driving Mobility Comments: not using device but was off balance and has fallen per family ADLs Comments: I with B/D, driving locally, but family does not approve.  Caring for husband.   Functional Status:  Mobility: Bed Mobility Overal bed mobility: Needs Assistance Bed Mobility: Supine to Sit Supine to sit: Min assist Sit to supine: Min guard General bed mobility comments: extra time and use of rails Transfers Overall transfer level: Needs assistance Equipment used: Rolling walker (2 wheels) Transfers: Sit to/from Stand Sit to Stand: Min assist Bed to/from chair/wheelchair/BSC transfer type:: Step pivot Step pivot transfers: Mod assist General transfer comment: initially min to both come forward and boost. Ambulation/Gait Ambulation/Gait assistance: Min assist, Mod assist Gait Distance (Feet): 100 Feet Assistive device: Rolling walker (2 wheels) Gait Pattern/deviations: Step-through pattern, Decreased step length - right, Decreased step length - left, Decreased stride length General Gait Details: overall gait tentative with R list, assist for R hand on the RW, assist against R drift and running in  the walls on the Right,  cues to maintain posture and proximity to the RW Gait velocity: decreased Gait velocity interpretation: <1.31 ft/sec, indicative of household ambulator   ADL: ADL Overall ADL's : Needs assistance/impaired Grooming: Wash/dry hands, Wash/dry face, Minimal assistance, Sitting Upper Body Bathing: Moderate assistance, Sitting Lower Body Bathing: Maximal assistance, Sit to/from stand Upper Body Dressing : Moderate assistance, Sitting Lower Body Dressing: Maximal assistance, Sit to/from stand Toilet Transfer: Minimal assistance, +2 for physical assistance Functional mobility during ADLs: Minimal assistance, Rolling walker (2 wheels) (side stepped along EOB) General ADL Comments: Cues for inititation. extra time and effort with bed mobility, cues for technique but no physical assist needed. Min - mod A to stand and sidestep towards right. +lightheadedness.   Cognition: Cognition Overall Cognitive Status: History of cognitive impairments - at baseline Arousal/Alertness: Awake/alert Orientation Level: Oriented X4 Attention: Sustained Sustained Attention: Appears intact Awareness: Impaired Awareness Impairment: Intellectual impairment Cognition  Arousal/Alertness: Awake/alert Behavior During Therapy: Flat affect, WFL for tasks assessed/performed Overall Cognitive Status: History of cognitive impairments - at baseline Area of Impairment: Safety/judgement, Awareness, Problem solving Safety/Judgement: Decreased awareness of safety, Decreased awareness of deficits Awareness: Emergent Problem Solving: Slow processing, Requires verbal cues General Comments: dementia at baseline     Weight 59.2 kg. Physical Exam Vitals and nursing note reviewed.  Constitutional:      Appearance: Normal appearance.  Cardiac: RRR Pulm: breathing comfortably on room air Abdomen: NT, ND Neurological:     Mental Status: She is alert and oriented to person, place, and time.      Comments: Right facial weakness with mild dysarthria. Able to follow simple one step motor commands. Weak grip on the right and decrease in fine motor control LUE with finger to nose, but otherwise 5/5 strength throughout  Results for orders placed or performed during the hospital encounter of 03/02/22 (from the past 48 hour(s))  CBC     Status: None   Collection Time: 03/07/22  5:35 AM  Result Value Ref Range   WBC 7.4 4.0 - 10.5 K/uL   RBC 4.02 3.87 - 5.11 MIL/uL   Hemoglobin 12.5 12.0 - 15.0 g/dL   HCT 36.0 36.0 - 46.0 %   MCV 89.6 80.0 - 100.0 fL   MCH 31.1 26.0 - 34.0 pg   MCHC 34.7 30.0 - 36.0 g/dL   RDW 13.0 11.5 - 15.5 %   Platelets 245 150 - 400 K/uL   nRBC 0.0 0.0 - 0.2 %    Comment: Performed at [facility], [place] [address], [place], [place] [id number]  Basic metabolic panel     Status: Abnormal   Collection Time: 03/07/22  5:35 AM  Result Value Ref Range   Sodium 136 135 - 145 mmol/L   Potassium 3.5 3.5 - 5.1 mmol/L   Chloride 102 98 - 111 mmol/L   CO2 23 22 - 32 mmol/L   Glucose, Bld 104 (H) 70 - 99 mg/dL    Comment: Glucose reference range applies only to samples taken after fasting for at least 8 hours.   BUN 18 8 - 23 mg/dL   Creatinine, Ser 1.19 (H) 0.44 - 1.00 mg/dL   Calcium 9.2 8.9 - 10.3 mg/dL   GFR, Estimated 46 (L) >60 mL/min    Comment: (NOTE) Calculated using the CKD-EPI Creatinine Equation (2021)    Anion gap 11 5 - 15    Comment: Performed at [facility] [address], [place], [place] [address]   No results found.    Weight 59.2 kg.  Medical Problem List and Plan: 1. Functional deficits secondary to large left basal ganglia infarct  -patient may not shower due to loop recorder placement  -ELOS/Goals: 7-10 days S  -Admit to CIR 2.  Antithrombotics: -DVT/anticoagulation:  Pharmaceutical: Lovenox  -antiplatelet therapy: DAPT X 3 weeks followed by Plavix alone 3. Chronic low back pain: Tylenol prn for chronic LBP. Add kpad. 4.  Mood: LCSW to follow for evaluation and support.   -antipsychotic agents: N/A 5. Neuropsych: This patient maybe capable of making decisions on her own behalf. 6. Skin/Wound Care: Routine pressure relief measures.  7. Fluids/Electrolytes/Nutrition: Monitor I/O. Check CMET in am. 8. HTN: Monitor BP TID--continue Torsemide, Losartan and Coreg.  9.  HFrEF/New CM: No signs of overload.  Continue Low salt diet. Strict I/O w/daily wts  --monitor for signs of overload.    --on Torsemide, Losartan, Coreg, ASA and atorvastatin.  --  plans for GDMT in the future.  10. Hypothyroid: On levothyroxine] 11. H/o depression: Continue Sertraline and Mirtazapine  --ativan bid prn as at home. 12. CKD:  BUN/SCr- 28/1.14 @ admission-->improved with IVF--18/1.19 --Avoid nephrotoxic  medications 13. Pre-diabetes: Hgb A1C-6.0. Dietary education.    I have personally performed a face to face diagnostic evaluation, including, but not limited to relevant history and physical exam findings, of this patient and developed relevant assessment and plan.  Additionally, I have reviewed and concur with the physician assistant's documentation above.  Leeroy Cha, MD, ABPMR   Bary Leriche, PA-C

## 2022-03-07 NOTE — Progress Notes (Signed)
Inpatient Rehab Admissions Coordinator:   I have a CIR bed for this Pt. Today after her loop recorder is placed. RN may call report to [phone].  Clemens Catholic, [place], [place] Admissions Coordinator  [phone] ([place]) [phone] (office)

## 2022-03-07 NOTE — TOC Transition Note (Signed)
Transition of Care [facility]) - CM/SW Discharge Note   Patient Details  Name: EMILIE CARP MRN: [id number] Date of Birth: 1940-10-06  [facility] CM/SW Contact:  Kermit Balo, RN Phone Number: 03/07/2022, 3:12 PM   Clinical Narrative:    Patient is discharging to CIR today. CM signing off.   Final next level of care: IP Rehab Facility Barriers to Discharge: No Barriers Identified   Patient Goals and CMS Choice     Choice offered to / list presented to : Patient  Discharge Placement                       Discharge Plan and Services                                     Social Determinants of Health (SDOH) Interventions     Readmission Risk Interventions     View : No data to display.

## 2022-03-07 NOTE — Discharge Summary (Signed)
Physician Discharge Summary  Tamara Bullock [id number] DOB: September 11, 1941 DOA: 03/02/2022  PCP: Pcp, No  Admit date: 03/02/2022 Discharge date: 03/07/2022  Time spent: 27 minutes  Recommendations for Outpatient Follow-up:  Follow Captiva trial as per Dr Leonie Man Continue aspirin 81 to complete 21 days and then stop-should continue Plavix lifelong-started atorvastatin this hospitalization Note dosage changes and reduction of polypharmacy this hospital stay on numerous psychotropics Will need eventual stringent BP control-allow permissive hypertension given acute stroke Check labs Chem-12 CBC 1 week Will need implantable loop recorder placed as per neurology  Discharge Diagnoses:  MAIN problem for hospitalization   Right basilar ganglia stroke  Please see below for itemized issues addressed in [facility]ps[facility]- refer to other progress notes for clarity if needed  Discharge Condition: Improved  Diet recommendation: Dysphagia  Filed Weights   03/04/22 0500 03/06/22 0449 03/07/22 0500  Weight: 63.1 kg 64.8 kg 64.6 kg    History of present illness:  81 year old white female community dwelling HTN, HLD, depression, subacute memory loss work-up in 2022 CKD 3A admit 6/3 with sudden onset expressive aphasia right-sided weakness-LKN W 03/03/2019 3:11 AM BP 200s In ED found to be aphasic with disc creased grip strength on right-CT head no acute findings MR brain acute basal ganglia ischemic CVA TNK not given, modified Rankin 0   Hospital Course:  Acute ischemic basal ganglia stroke Carotids echocardiogram no source although reduced EF A-fib suspected ILR to be placed prior to discharge DAPT X 3 weeks then Plavix alone New onset HFrEF, 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% Meds adjusted on discharge-patient will continue torsemide losartan aspirin as above and atorvastatin Amiloride was discontinued Cardiology recommended ILR prior to discharge with outpatient stress testing Coreg was resumed at a lower dose given need  for permissive hypertension and Coreg as well as losartan can be increased back to home dosing Clonidine was discontinued this hospitalization CKD 3 AA on admission Stable relative to baseline Urinary retention present during hospital stay This resolved on its own] Memory loss memory impairment as well as depression Meds were adjusted this hospital stay-specifically Trileptal 450 was discontinued patient was continued on Remeron as well as sertraline and these may need to be cut back on in terms of dosing Ativan was cut back to 0.25 twice daily Continue de-escalation of meds as integral Hypothyroidism Continue Synthroid  Discharge Exam: Vitals:   03/07/22 0400 03/07/22 0825  BP: (!) 172/74 (!) 157/96  Pulse: 64 71  Resp:  19  Temp: 97.9 F (36.6 C) 98.2 F (36.8 C)  SpO2: 100% 100%    Subj on day of d/c   Awake coherent some word finding difficulty seems frustrated at times No other motor issues No pain fever chills eating breakfast with daughter at the bedside  General Exam on discharge  EOMI NCAT no focal deficit Smile symmetric-right shoulder slightly weaker than left Tongue protrudes midline S1-S2 no murmur sinus bradycardia on monitors Chest clear no added sound rales rhonchi Abdomen soft no rebound no guarding ROM grossly intact-I did not examine cerebellar signs Sensory is grossly intact  Discharge Instructions   Discharge Instructions     Diet - low sodium heart healthy   Complete by: As directed    Increase activity slowly   Complete by: As directed       Allergies as of 03/07/2022       Reactions   Penicillin G Hives   Sulfa Antibiotics Hives   Aciphex [rabeprazole]    Amitriptyline    Aspirin  Azithromycin    Carbocaine [mepivacaine]    Cimetidine    Ciprofloxacin    Cisapride    Clindamycin/lincomycin    Cortisporin-tc [neomycin-colist-hc-thonzonium]    Escitalopram    Hydrochlorothiazide    Hydrocodone    Ibuprofen    Iodine     Macrodantin [nitrofurantoin]    Medroxyprogesterone    Meloxicam    Minocycline    Moxifloxacin    Chest pain    Nitrofuran Derivatives    Nsaids    Ofloxacin    Pantoprazole    Pneumococcal Vaccines    Prednisolone    Prednisone    Prevacid [lansoprazole]    Propoxyphene    Soap    Spironolactone    Tobradex [tobramycin-dexamethasone]    Tramadol    Trolamine Salicylate    Verapamil    Voltaren [diclofenac]         Medication List     STOP taking these medications    aMILoride 5 MG tablet Commonly known as: MIDAMOR   Calcium Carb-Cholecalciferol 600-10 MG-MCG Tabs   Cholecalciferol 25 MCG (1000 UT) tablet   cloNIDine 0.2 mg/24hr patch Commonly known as: CATAPRES - Dosed in mg/24 hr   Oxcarbazepine 300 MG tablet Commonly known as: TRILEPTAL   potassium chloride 10 MEQ tablet Commonly known as: KLOR-CON       TAKE these medications    atorvastatin 80 MG tablet Commonly known as: LIPITOR Take 1 tablet (80 mg total) by mouth daily.   CAPTIVA aspirin 81 MG tablet Chew 1 tablet by mouth daily. What changed: Another medication with the same name was added. Make sure you understand how and when to take each.   aspirin EC 81 MG tablet Take 1 tablet (81 mg total) by mouth daily. Swallow whole. What changed: You were already taking a medication with the same name, and this prescription was added. Make sure you understand how and when to take each.   carvedilol 3.125 MG tablet Commonly known as: COREG Take 1 tablet (3.125 mg total) by mouth 2 (two) times daily with a meal. What changed:  medication strength how much to take when to take this   clopidogrel 75 MG tablet Commonly known as: PLAVIX Take 1 tablet (75 mg total) by mouth daily.   Euthyrox 50 MCG tablet Generic drug: levothyroxine Take 50 mcg by mouth daily.   LORazepam 0.5 MG tablet Commonly known as: ATIVAN Take 0.5 tablets (0.25 mg total) by mouth 2 (two) times daily as needed for  anxiety. What changed:  how much to take when to take this   losartan 25 MG tablet Commonly known as: COZAAR Take 0.5 tablets (12.5 mg total) by mouth daily. What changed:  medication strength how much to take   mirtazapine 7.5 MG tablet Commonly known as: REMERON Take 7.5 mg by mouth at bedtime.   sertraline 25 MG tablet Commonly known as: ZOLOFT Take 25 mg by mouth daily.   torsemide 20 MG tablet Commonly known as: DEMADEX Take 40 mg by mouth daily.       Allergies  Allergen Reactions   Penicillin G Hives   Sulfa Antibiotics Hives   Aciphex [Rabeprazole]    Amitriptyline    Aspirin    Azithromycin    Carbocaine [Mepivacaine]    Cimetidine    Ciprofloxacin    Cisapride    Clindamycin/Lincomycin    Cortisporin-Tc [Neomycin-Colist-Hc-Thonzonium]    Escitalopram    Hydrochlorothiazide    Hydrocodone    Ibuprofen    Iodine  Macrodantin [Nitrofurantoin]    Medroxyprogesterone    Meloxicam    Minocycline    Moxifloxacin     Chest pain    Nitrofuran Derivatives    Nsaids    Ofloxacin    Pantoprazole    Pneumococcal Vaccines    Prednisolone    Prednisone    Prevacid [Lansoprazole]    Propoxyphene    Soap    Spironolactone    Tobradex [Tobramycin-Dexamethasone]    Tramadol    Trolamine Salicylate    Verapamil    Voltaren [Diclofenac]       The results of significant diagnostics from this hospitalization (including imaging, microbiology, ancillary and laboratory) are listed below for reference.    Significant Diagnostic Studies: MR ANGIO HEAD WO CONTRAST  Result Date: 03/03/2022 CLINICAL DATA:  Initial evaluation for neuro deficit, stroke suspected. EXAM: MRI HEAD WITHOUT CONTRAST MRA HEAD WITHOUT CONTRAST MRA NECK WITHOUT AND WITH CONTRAST TECHNIQUE: Multiplanar, multi-echo pulse sequences of the brain and surrounding structures were acquired without intravenous contrast. Angiographic images of the Circle of Willis were acquired using MRA  technique without intravenous contrast. Angiographic images of the neck were acquired using MRA technique without and with intravenous contrast. Carotid stenosis measurements (when applicable) are obtained utilizing NASCET criteria, using the distal internal carotid diameter as the denominator. CONTRAST:  Please see contrast documentation. COMPARISON:  Prior head CT from 03/02/2022. FINDINGS: MRI HEAD FINDINGS Brain: Generalized age-related cerebral volume loss. Scattered patchy T2/FLAIR hyperintensity involving the periventricular deep white matter both cerebral hemispheres, most like related chronic microvascular ischemic disease, mild for age. 2.5 cm curvilinear ischemic infarcts seen involving the left basal ganglia. No associated hemorrhage or mass effect. No other evidence for acute or subacute ischemia. No other areas of chronic cortical infarction. No acute or chronic intracranial blood products. No mass lesion, midline shift or mass effect. No hydrocephalus or extra-axial fluid collection. Partially empty sella noted. Vascular: Major intracranial vascular flow voids are maintained. Skull and upper cervical spine: Cranial junction within normal limits. Bone marrow signal intensity within normal limits. No scalp soft tissue abnormality. Sinuses/Orbits: Prior bilateral ocular lens replacement. Scattered mucosal thickening noted about the ethmoidal air cells. Paranasal sinuses are otherwise clear. No significant mastoid effusion. Inner ear structures grossly normal. Other: None. MRA HEAD FINDINGS Anterior circulation: Examination mildly degraded by motion. Both internal carotid arteries patent to the termini without stenosis or other abnormality. A1 segments patent. Normal anterior communicating artery complex. Anterior cerebral arteries patent without significant stenosis. No M1 stenosis or occlusion. No proximal MCA branch occlusion. Distal MCA branches perfused and symmetric. Mild distal small vessel  atheromatous irregularity. Posterior circulation: Both V4 segments patent without stenosis. Right vertebral artery slightly dominant. Partially visualized PICA patent bilaterally. Basilar patent to its distal aspect without stenosis. Superior cerebellar and posterior cerebral arteries patent bilaterally. Anatomic variants: None significant.  No aneurysm. MRA NECK FINDINGS Aortic arch: Examination technically limited due to timing of the contrast bolus, with no contrast seen within the arterial system. Aortic arch and origin of the great vessels not seen or assessed on this exam. Right carotid system: Visualized right CCA patent without stenosis. Mild atheromatous irregularity about the right carotid bulb/proximal right ICA without hemodynamically greater than 50% stenosis. Right ICA patent distally without stenosis or evidence for dissection. Left carotid system: Eccentric plaque noted within the mid-distal left CCA without significant stenosis. Mild for age atheromatous irregularity about the left carotid bulb/proximal left ICA without hemodynamically significant greater than 50% stenosis. Left ICA patent  distally without stenosis or evidence for dissection. Vertebral arteries: Both vertebral arteries appear to arise from the subclavian arteries. No visible proximal subclavian artery stenosis. Vertebral arteries patent within the neck without visible stenosis or evidence for dissection. Other: None IMPRESSION: MRI HEAD: 1. 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 2. Underlying age-related cerebral volume loss with mild chronic microvascular ischemic disease. MRA HEAD: 1. Negative intracranial MRA for large vessel occlusion. No hemodynamically significant or correctable stenosis. 2. Mild distal small vessel atheromatous irregularity. MRA NECK: 1. Technically limited exam due to timing of the contrast bolus, with no contrast seen within the arterial system of the neck. 2. Mild atherosclerotic  change about the carotid bifurcations/proximal ICAs bilaterally without hemodynamically significant greater than 50% stenosis. 3. Patent vertebral arteries within the neck without significant stenosis or other acute finding. Electronically Signed   By: Jeannine Boga M.D.   On: 03/03/2022 02:47   MR Angiogram Neck W or Wo Contrast  Result Date: 03/03/2022 CLINICAL DATA:  Initial evaluation for neuro deficit, stroke suspected. EXAM: MRI HEAD WITHOUT CONTRAST MRA HEAD WITHOUT CONTRAST MRA NECK WITHOUT AND WITH CONTRAST TECHNIQUE: Multiplanar, multi-echo pulse sequences of the brain and surrounding structures were acquired without intravenous contrast. Angiographic images of the Circle of Willis were acquired using MRA technique without intravenous contrast. Angiographic images of the neck were acquired using MRA technique without and with intravenous contrast. Carotid stenosis measurements (when applicable) are obtained utilizing NASCET criteria, using the distal internal carotid diameter as the denominator. CONTRAST:  Please see contrast documentation. COMPARISON:  Prior head CT from 03/02/2022. FINDINGS: MRI HEAD FINDINGS Brain: Generalized age-related cerebral volume loss. Scattered patchy T2/FLAIR hyperintensity involving the periventricular deep white matter both cerebral hemispheres, most like related chronic microvascular ischemic disease, mild for age. 2.5 cm curvilinear ischemic infarcts seen involving the left basal ganglia. No associated hemorrhage or mass effect. No other evidence for acute or subacute ischemia. No other areas of chronic cortical infarction. No acute or chronic intracranial blood products. No mass lesion, midline shift or mass effect. No hydrocephalus or extra-axial fluid collection. Partially empty sella noted. Vascular: Major intracranial vascular flow voids are maintained. Skull and upper cervical spine: Cranial junction within normal limits. Bone marrow signal intensity within  normal limits. No scalp soft tissue abnormality. Sinuses/Orbits: Prior bilateral ocular lens replacement. Scattered mucosal thickening noted about the ethmoidal air cells. Paranasal sinuses are otherwise clear. No significant mastoid effusion. Inner ear structures grossly normal. Other: None. MRA HEAD FINDINGS Anterior circulation: Examination mildly degraded by motion. Both internal carotid arteries patent to the termini without stenosis or other abnormality. A1 segments patent. Normal anterior communicating artery complex. Anterior cerebral arteries patent without significant stenosis. No M1 stenosis or occlusion. No proximal MCA branch occlusion. Distal MCA branches perfused and symmetric. Mild distal small vessel atheromatous irregularity. Posterior circulation: Both V4 segments patent without stenosis. Right vertebral artery slightly dominant. Partially visualized PICA patent bilaterally. Basilar patent to its distal aspect without stenosis. Superior cerebellar and posterior cerebral arteries patent bilaterally. Anatomic variants: None significant.  No aneurysm. MRA NECK FINDINGS Aortic arch: Examination technically limited due to timing of the contrast bolus, with no contrast seen within the arterial system. Aortic arch and origin of the great vessels not seen or assessed on this exam. Right carotid system: Visualized right CCA patent without stenosis. Mild atheromatous irregularity about the right carotid bulb/proximal right ICA without hemodynamically greater than 50% stenosis. Right ICA patent distally without stenosis or evidence for dissection.  Left carotid system: Eccentric plaque noted within the mid-distal left CCA without significant stenosis. Mild for age atheromatous irregularity about the left carotid bulb/proximal left ICA without hemodynamically significant greater than 50% stenosis. Left ICA patent distally without stenosis or evidence for dissection. Vertebral arteries: Both vertebral arteries  appear to arise from the subclavian arteries. No visible proximal subclavian artery stenosis. Vertebral arteries patent within the neck without visible stenosis or evidence for dissection. Other: None IMPRESSION: MRI HEAD: 1. 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 2. Underlying age-related cerebral volume loss with mild chronic microvascular ischemic disease. MRA HEAD: 1. Negative intracranial MRA for large vessel occlusion. No hemodynamically significant or correctable stenosis. 2. Mild distal small vessel atheromatous irregularity. MRA NECK: 1. Technically limited exam due to timing of the contrast bolus, with no contrast seen within the arterial system of the neck. 2. Mild atherosclerotic change about the carotid bifurcations/proximal ICAs bilaterally without hemodynamically significant greater than 50% stenosis. 3. Patent vertebral arteries within the neck without significant stenosis or other acute finding. Electronically Signed   By: Jeannine Boga M.D.   On: 03/03/2022 02:47   MR BRAIN WO CONTRAST  Result Date: 03/03/2022 CLINICAL DATA:  Initial evaluation for neuro deficit, stroke suspected. EXAM: MRI HEAD WITHOUT CONTRAST MRA HEAD WITHOUT CONTRAST MRA NECK WITHOUT AND WITH CONTRAST TECHNIQUE: Multiplanar, multi-echo pulse sequences of the brain and surrounding structures were acquired without intravenous contrast. Angiographic images of the Circle of Willis were acquired using MRA technique without intravenous contrast. Angiographic images of the neck were acquired using MRA technique without and with intravenous contrast. Carotid stenosis measurements (when applicable) are obtained utilizing NASCET criteria, using the distal internal carotid diameter as the denominator. CONTRAST:  Please see contrast documentation. COMPARISON:  Prior head CT from 03/02/2022. FINDINGS: MRI HEAD FINDINGS Brain: Generalized age-related cerebral volume loss. Scattered patchy T2/FLAIR  hyperintensity involving the periventricular deep white matter both cerebral hemispheres, most like related chronic microvascular ischemic disease, mild for age. 2.5 cm curvilinear ischemic infarcts seen involving the left basal ganglia. No associated hemorrhage or mass effect. No other evidence for acute or subacute ischemia. No other areas of chronic cortical infarction. No acute or chronic intracranial blood products. No mass lesion, midline shift or mass effect. No hydrocephalus or extra-axial fluid collection. Partially empty sella noted. Vascular: Major intracranial vascular flow voids are maintained. Skull and upper cervical spine: Cranial junction within normal limits. Bone marrow signal intensity within normal limits. No scalp soft tissue abnormality. Sinuses/Orbits: Prior bilateral ocular lens replacement. Scattered mucosal thickening noted about the ethmoidal air cells. Paranasal sinuses are otherwise clear. No significant mastoid effusion. Inner ear structures grossly normal. Other: None. MRA HEAD FINDINGS Anterior circulation: Examination mildly degraded by motion. Both internal carotid arteries patent to the termini without stenosis or other abnormality. A1 segments patent. Normal anterior communicating artery complex. Anterior cerebral arteries patent without significant stenosis. No M1 stenosis or occlusion. No proximal MCA branch occlusion. Distal MCA branches perfused and symmetric. Mild distal small vessel atheromatous irregularity. Posterior circulation: Both V4 segments patent without stenosis. Right vertebral artery slightly dominant. Partially visualized PICA patent bilaterally. Basilar patent to its distal aspect without stenosis. Superior cerebellar and posterior cerebral arteries patent bilaterally. Anatomic variants: None significant.  No aneurysm. MRA NECK FINDINGS Aortic arch: Examination technically limited due to timing of the contrast bolus, with no contrast seen within the arterial  system. Aortic arch and origin of the great vessels not seen or assessed on this exam. Right  carotid system: Visualized right CCA patent without stenosis. Mild atheromatous irregularity about the right carotid bulb/proximal right ICA without hemodynamically greater than 50% stenosis. Right ICA patent distally without stenosis or evidence for dissection. Left carotid system: Eccentric plaque noted within the mid-distal left CCA without significant stenosis. Mild for age atheromatous irregularity about the left carotid bulb/proximal left ICA without hemodynamically significant greater than 50% stenosis. Left ICA patent distally without stenosis or evidence for dissection. Vertebral arteries: Both vertebral arteries appear to arise from the subclavian arteries. No visible proximal subclavian artery stenosis. Vertebral arteries patent within the neck without visible stenosis or evidence for dissection. Other: None IMPRESSION: MRI HEAD: 1. 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 2. Underlying age-related cerebral volume loss with mild chronic microvascular ischemic disease. MRA HEAD: 1. Negative intracranial MRA for large vessel occlusion. No hemodynamically significant or correctable stenosis. 2. Mild distal small vessel atheromatous irregularity. MRA NECK: 1. Technically limited exam due to timing of the contrast bolus, with no contrast seen within the arterial system of the neck. 2. Mild atherosclerotic change about the carotid bifurcations/proximal ICAs bilaterally without hemodynamically significant greater than 50% stenosis. 3. Patent vertebral arteries within the neck without significant stenosis or other acute finding. Electronically Signed   By: Jeannine Boga M.D.   On: 03/03/2022 02:47   ECHOCARDIOGRAM COMPLETE  Result Date: 03/03/2022    ECHOCARDIOGRAM REPORT   Patient Name:   Tamara Bullock Date of Exam: 03/03/2022 Medical Rec #:  [id number]       Height: Accession #:     [id number]      Weight: Date of Birth:  1940-11-19       BSA: Patient Age:    59 years        BP:           155/78 mmHg Patient Gender: F               HR:           61 bpm. Exam Location:  Inpatient Procedure: 2D Echo Indications:    stroke  History:        Patient has no prior history of Echocardiogram examinations.                 Chronic kidney disease; Risk Factors:Hypertension.  Sonographer:    Johny Chess RDCS Referring Phys: [id number] [place]  1. Left ventricular ejection fraction, by estimation, is 30 to 35%. The left ventricle has moderately decreased function. The left ventricle demonstrates global hypokinesis. Left ventricular diastolic parameters are indeterminate.  2. Right ventricular systolic function is normal. The right ventricular size is normal. There is mildly elevated pulmonary artery systolic pressure.  3. Left atrial size was mildly dilated.  4. Trivial mitral valve regurgitation.  5. Aortic valve regurgitation is not visualized.  6. The inferior vena cava is normal in size with greater than 50% respiratory variability, suggesting right atrial pressure of 3 mmHg. Comparison(s): No prior Echocardiogram. FINDINGS  Left Ventricle: Left ventricular ejection fraction, by estimation, is 30 to 35%. The left ventricle has moderately decreased function. The left ventricle demonstrates global hypokinesis. The left ventricular internal cavity size was normal in size. There is no left ventricular hypertrophy. Left ventricular diastolic parameters are indeterminate. Right Ventricle: The right ventricular size is normal. Right ventricular systolic function is normal. There is mildly elevated pulmonary artery systolic pressure. The tricuspid regurgitant velocity is 2.90 m/s, and with an assumed right atrial pressure of  3 mmHg, the estimated right ventricular systolic pressure is 123XX123 mmHg. Left Atrium: Left atrial size was mildly dilated. Right Atrium: Right atrial size was normal in  size. Pericardium: There is no evidence of pericardial effusion. Mitral Valve: Trivial mitral valve regurgitation. Tricuspid Valve: Tricuspid valve regurgitation is trivial. Aortic Valve: Aortic valve regurgitation is not visualized. Pulmonic Valve: Pulmonic valve regurgitation is not visualized. Venous: The inferior vena cava is normal in size with greater than 50% respiratory variability, suggesting right atrial pressure of 3 mmHg.  LEFT VENTRICLE PLAX 2D LVIDd:         4.00 cm     Diastology LVIDs:         2.75 cm     LV e' medial:    5.63 cm/s LV PW:         0.90 cm     LV E/e' medial:  11.7 LV IVS:        0.90 cm     LV e' lateral:   5.78 cm/s LVOT diam:     1.70 cm     LV E/e' lateral: 11.4 LV SV:         27 LVOT Area:     2.27 cm  LV Volumes (MOD) LV vol d, MOD A4C: 62.9 ml LV vol s, MOD A4C: 44.3 ml LV SV MOD A4C:     62.9 ml RIGHT VENTRICLE             IVC RV S prime:     11.30 cm/s  IVC diam: 1.20 cm TAPSE (M-mode): 2.4 cm LEFT ATRIUM             RIGHT ATRIUM LA diam:        3.00 cm RA Area:     13.40 cm LA Vol (A2C):   38.0 ml RA Volume:   31.10 ml LA Vol (A4C):   37.0 ml LA Biplane Vol: 38.5 ml  AORTIC VALVE LVOT Vmax:   55.10 cm/s LVOT Vmean:  34.600 cm/s LVOT VTI:    0.121 m  AORTA Ao Root diam: 2.90 cm Ao Asc diam:  3.10 cm MITRAL VALVE               TRICUSPID VALVE MV Area (PHT): 1.94 cm    TR Peak grad:   33.6 mmHg MV Decel Time: 391 msec    TR Vmax:        290.00 cm/s MV E velocity: 65.80 cm/s MV A velocity: 65.40 cm/s  SHUNTS MV E/A ratio:  1.01        Systemic VTI:  0.12 m                            Systemic Diam: 1.70 cm Phineas Inches Electronically signed by Phineas Inches Signature Date/Time: 03/03/2022/1:22:55 PM    Final    CT HEAD CODE STROKE WO CONTRAST  Result Date: 03/02/2022 CLINICAL DATA:  Code stroke. Initial evaluation for neuro deficit, stroke suspected. EXAM: CT HEAD WITHOUT CONTRAST TECHNIQUE: Contiguous axial images were obtained from the base of the skull through the vertex  without intravenous contrast. RADIATION DOSE REDUCTION: This exam was performed according to the departmental dose-optimization program which includes automated exposure control, adjustment of the mA and/or kV according to patient size and/or use of iterative reconstruction technique. COMPARISON:  None Available. FINDINGS: Brain: Age-related cerebral atrophy with chronic small vessel ischemic disease. No acute intracranial hemorrhage. No acute large vessel territory infarct.  No mass lesion or midline shift. No hydrocephalus or extra-axial fluid collection. Vascular: No hyperdense vessel. Calcified atherosclerosis present at skull base. Skull: Scalp soft tissues and calvarium within normal limits. Sinuses/Orbits: Globes and orbital soft tissues demonstrate no acute finding. Scattered mucosal thickening noted within the ethmoidal air cells. Paranasal sinuses are otherwise clear. No mastoid effusion. Other: None. ASPECTS [facility] Stroke Program Early CT Score) - Ganglionic level infarction (caudate, lentiform nuclei, internal capsule, insula, M1-M3 cortex): 7 - Supraganglionic infarction (M4-M6 cortex): 3 Total score (0-10 with 10 being normal): 10 IMPRESSION: 1. No acute intracranial abnormality. 2. ASPECTS is 10. 3. Age-related cerebral atrophy with mild chronic small vessel ischemic disease. These results were communicated to Dr. Theda Sers at 9:51 pm on 03/02/2022 by text page via the [facility] messaging system. Electronically Signed   By: Jeannine Boga M.D.   On: 03/02/2022 21:51    Microbiology: Recent Results (from the past 240 hour(s))  Resp Panel by RT-PCR (Flu A&B, Covid) Anterior Nasal Swab     Status: None   Collection Time: 03/02/22 11:40 PM   Specimen: Anterior Nasal Swab  Result Value Ref Range Status   SARS Coronavirus 2 by RT PCR NEGATIVE NEGATIVE Final    Comment: (NOTE) SARS-CoV-2 target nucleic acids are NOT DETECTED.  The SARS-CoV-2 RNA is generally detectable in upper  respiratory specimens during the acute phase of infection. The lowest concentration of SARS-CoV-2 viral copies this assay can detect is 138 copies/mL. A negative result does not preclude SARS-Cov-2 infection and should not be used as the sole basis for treatment or other patient management decisions. A negative result may occur with  improper specimen collection/handling, submission of specimen other than nasopharyngeal swab, presence of viral mutation(s) within the areas targeted by this assay, and inadequate number of viral copies(<138 copies/mL). A negative result must be combined with clinical observations, patient history, and epidemiological information. The expected result is Negative.  Fact Sheet for Patients:  EntrepreneurPulse.com.au  Fact Sheet for Healthcare Providers:  IncredibleEmployment.be  This test is no t yet approved or cleared by the [place] FDA and  has been authorized for detection and/or diagnosis of SARS-CoV-2 by FDA under an Emergency Use Authorization (EUA). This EUA will remain  in effect (meaning this test can be used) for the duration of the COVID-19 declaration under Section 564(b)(1) of the Act, 21 U.S.C.section 360bbb-3(b)(1), unless the authorization is terminated  or revoked sooner.       Influenza A by PCR NEGATIVE NEGATIVE Final   Influenza B by PCR NEGATIVE NEGATIVE Final    Comment: (NOTE) The Xpert Xpress SARS-CoV-2/FLU/RSV plus assay is intended as an aid in the diagnosis of influenza from Nasopharyngeal swab specimens and should not be used as a sole basis for treatment. Nasal washings and aspirates are unacceptable for Xpert Xpress SARS-CoV-2/FLU/RSV testing.  Fact Sheet for Patients: EntrepreneurPulse.com.au  Fact Sheet for Healthcare Providers: IncredibleEmployment.be  This test is not yet approved or cleared by the [place] FDA and has been  authorized for detection and/or diagnosis of SARS-CoV-2 by FDA under an Emergency Use Authorization (EUA). This EUA will remain in effect (meaning this test can be used) for the duration of the COVID-19 declaration under Section 564(b)(1) of the Act, 21 U.S.C. section 360bbb-3(b)(1), unless the authorization is terminated or revoked.  Performed at [facility], [place] [address] [place], [place] [address]      Labs: Basic Metabolic Panel: Recent Labs  Lab 03/02/22 2157 03/02/22 2207 03/03/22 [id number]  03/07/22 0535  NA 136 135 137 136  K 3.7 3.7 3.6 3.5  CL 98 98 102 102  CO2 26  --  25 23  GLUCOSE 123* 118* 114* 104*  BUN 28* 29* 26* 18  CREATININE 1.14* 1.20* 1.05* 1.19*  CALCIUM 9.7  --  9.6 9.2  MG  --   --  2.3  --   PHOS  --   --  3.4  --    Liver Function Tests: Recent Labs  Lab 03/02/22 2157  AST 30  ALT 17  ALKPHOS 74  BILITOT 0.4  PROT 7.2  ALBUMIN 4.4   No results for input(s): LIPASE, AMYLASE in the last 168 hours. No results for input(s): AMMONIA in the last 168 hours. CBC: Recent Labs  Lab 03/02/22 2157 03/02/22 2207 03/03/22 0414 03/07/22 0535  WBC 6.5  --  9.4 7.4  NEUTROABS 3.4  --   --   --   HGB 12.2 12.6 12.4 12.5  HCT 35.8* 37.0 35.6* 36.0  MCV 89.9  --  88.6 89.6  PLT 290  --  278 245   Cardiac Enzymes: No results for input(s): CKTOTAL, CKMB, CKMBINDEX, TROPONINI in the last 168 hours. BNP: BNP (last 3 results) No results for input(s): BNP in the last 8760 hours.  ProBNP (last 3 results) No results for input(s): PROBNP in the last 8760 hours.  CBG: No results for input(s): GLUCAP in the last 168 hours.     Signed:  Nita Sells MD   [facility] 03/07/2022, 9:35 AM

## 2022-03-07 NOTE — Progress Notes (Signed)
PT Cancellation Note  Patient Details Name: Tamara Bullock MRN: [id number] DOB: 1941-05-10   Cancelled Treatment:    Reason Eval/Treat Not Completed: Other (comment). Pt very emotional upon entry with presumed family reporting pt just received anxiety meds and is anxious about the transfer to AIR later this evening and about her husband, requesting PT hold off at this time. Unfortunately, it is near the end of the work day so will not be able to check back later, thus if pt remains in the acute portion of the hospital will plan to follow-up tomorrow.   Raymond Gurney, PT, DPT Acute Rehabilitation Services  Office: [phone]    Tamara Bullock 03/07/2022, 4:43 PM

## 2022-03-07 NOTE — Progress Notes (Signed)
INPATIENT REHABILITATION ADMISSION NOTE   Arrival Method: bed    Mental Orientation:alert    Assessment:done   Skin:done with Pearline Cables LPN/WTA   IV'S:d/c   Pain:none   Tubes and Drains:none   Safety Measures:done   Vital Signs:done   Height and Weight:done   Rehab Orientationdone   Family: daughter   Notes:

## 2022-03-08 DIAGNOSIS — I6381 Other cerebral infarction due to occlusion or stenosis of small artery: Secondary | ICD-10-CM | POA: Diagnosis not present

## 2022-03-08 LAB — CBC WITH DIFFERENTIAL/PLATELET
Abs Immature Granulocytes: 0.03 10*3/uL (ref 0.00–0.07)
Basophils Absolute: 0.1 10*3/uL (ref 0.0–0.1)
Basophils Relative: 1 %
Eosinophils Absolute: 0.4 10*3/uL (ref 0.0–0.5)
Eosinophils Relative: 5 %
HCT: 36.3 % (ref 36.0–46.0)
Hemoglobin: 12.6 g/dL (ref 12.0–15.0)
Immature Granulocytes: 0 %
Lymphocytes Relative: 31 %
Lymphs Abs: 2.6 10*3/uL (ref 0.7–4.0)
MCH: 31.1 pg (ref 26.0–34.0)
MCHC: 34.7 g/dL (ref 30.0–36.0)
MCV: 89.6 fL (ref 80.0–100.0)
Monocytes Absolute: 0.9 10*3/uL (ref 0.1–1.0)
Monocytes Relative: 11 %
Neutro Abs: 4.4 10*3/uL (ref 1.7–7.7)
Neutrophils Relative %: 52 %
Platelets: 268 10*3/uL (ref 150–400)
RBC: 4.05 MIL/uL (ref 3.87–5.11)
RDW: 13.1 % (ref 11.5–15.5)
WBC: 8.4 10*3/uL (ref 4.0–10.5)
nRBC: 0 % (ref 0.0–0.2)

## 2022-03-08 LAB — COMPREHENSIVE METABOLIC PANEL
ALT: 37 U/L (ref 0–44)
AST: 56 U/L — ABNORMAL HIGH (ref 15–41)
Albumin: 3.7 g/dL (ref 3.5–5.0)
Alkaline Phosphatase: 65 U/L (ref 38–126)
Anion gap: 11 (ref 5–15)
BUN: 26 mg/dL — ABNORMAL HIGH (ref 8–23)
CO2: 26 mmol/L (ref 22–32)
Calcium: 9.3 mg/dL (ref 8.9–10.3)
Chloride: 101 mmol/L (ref 98–111)
Creatinine, Ser: 1.34 mg/dL — ABNORMAL HIGH (ref 0.44–1.00)
GFR, Estimated: 40 mL/min — ABNORMAL LOW (ref >60)
Glucose, Bld: 109 mg/dL — ABNORMAL HIGH (ref 70–99)
Potassium: 3.8 mmol/L (ref 3.5–5.1)
Sodium: 138 mmol/L (ref 135–145)
Total Bilirubin: 0.5 mg/dL (ref 0.3–1.2)
Total Protein: 6.6 g/dL (ref 6.5–8.1)

## 2022-03-08 NOTE — Progress Notes (Addendum)
PMR Admission Coordinator Pre-Admission Assessment   Patient: Tamara Bullock is an 81 y.o., female MRN: [id number] DOB: 1941-04-29 Height:   Weight: 64.8 kg   Insurance Information HMO: yes    PPO:      PCP:      IPA:      80/20:      OTHER:  PRIMARY: BCBS Medicare      Policy#: [id number]      Subscriber: patient CM Name: Larene Beach      Phone#: [phone]     Fax#: [phone] Pre-Cert#: [id number] [place] for CIR from [facility] with [facility] Medicare with updates due to fax listed above on 6/14.      Employer:  Benefits:  Phone #: [phone]     Name:  Eff. Date: 10/01/21     Deduct: does not have one Out of Pocket Max: $3,950 ($35 met)      Life Max: NA CIR: $335/day co=pay for days 1-5      SNF: 100% coverage for days 1-20, $196/day co-pay for days 21-60, 100% coverage for days 61-100 Outpatient: $10 co-pay/visit     Co-Pay:  Home Health: 100% coverage      Co-Pay:  DME: 100% coverage     Co-Pay:  Providers: in-network SECONDARY:       Policy#:     Phone#:    Development worker, community:       Phone#:    The Engineer, petroleum" for patients in Inpatient Rehabilitation Facilities with attached "Privacy Act Clarissa Records" was provided and verbally reviewed with: Patient and Family   Emergency Contact Information Contact Information       Name Relation Home Work Mobile    Zeman,BOBBY Spouse [phone]        Cariann, Kinnamon     [place] Daughter     [phone]           Current Medical History  Patient Admitting Diagnosis: CVA History of Present Illness: Pt is an 81 year old female with medical hx significant for: CKD 3B, essential HTN, generalized anxiety. Pt [facility] on 03[facility]02[facility]22 d/t right-sided facial droop, right arm weakness, right leg weakness, and some aphasia. Code stroke activated in ED. Pt not a candidate for tPA. CT head negative for acute intracranial abnormality. MRI brain showed acute ischemic  stroke in left basal ganglia. MRA head negative for LVO; showed mild atherosclerotic changes at both bifurcations. 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 RV function was normal with only trivial MR and no significant valve abnormalities.  Stroke team recommended loop recorder at d/c to monitor for afib as well as DAPT aspirin and Plavix x3 weeks, then plavix alone.  Therapy evaluations completed and CIR recommended.    Complete NIHSS TOTAL: 3   Patient's medical record from [facility] has been reviewed by the rehabilitation admission coordinator and physician.   Past Medical History      Past Medical History:  Diagnosis Date   Essential hypertension     Hyperlipidemia        Has the patient had major surgery during 100 days prior to admission? No   Family History   family history includes Heart attack in her father; Stroke in her mother.   Current Medications   Current Facility-Administered Medications:    aspirin EC tablet 81 mg, 81 mg, Oral, Daily, Irene Pap N, DO, 81 mg at 03/06/22 0943   atorvastatin (LIPITOR)  tablet 80 mg, 80 mg, Oral, Daily, Irene Pap N, DO, 80 mg at 03/06/22 0943   carvedilol (COREG) tablet 3.125 mg, 3.125 mg, Oral, BID WC, Branch, Royetta Crochet, MD, 3.125 mg at 03/06/22 0943   clopidogrel (PLAVIX) tablet 75 mg, 75 mg, Oral, Daily, Irene Pap N, DO, 75 mg at 03/06/22 0942   enoxaparin (LOVENOX) injection 40 mg, 40 mg, Subcutaneous, Daily, Hall, Carole N, DO, 40 mg at 03/06/22 0943   labetalol (NORMODYNE) injection 5 mg, 5 mg, Intravenous, Q2H PRN, Hall, Carole N, DO   levothyroxine (SYNTHROID) tablet 50 mcg, 50 mcg, Oral, Daily, Irene Pap N, DO, 50 mcg at 03/06/22 8676   LORazepam (ATIVAN) tablet 0.5 mg, 0.5 mg, Oral, BID PRN, Edwin Dada, MD, 0.5 mg at 03/04/22 2050   losartan (COZAAR) tablet 12.5 mg, 12.5 mg, Oral, Daily, Branch, Royetta Crochet, MD, 12.5 mg at 03/06/22 0943   mirtazapine (REMERON) tablet 7.5 mg, 7.5  mg, Oral, QHS, Danford, Suann Larry, MD, 7.5 mg at 03/05/22 2141   sertraline (ZOLOFT) tablet 25 mg, 25 mg, Oral, Daily, Danford, Suann Larry, MD, 25 mg at 03/06/22 0943   torsemide (DEMADEX) tablet 40 mg, 40 mg, Oral, Daily, Danford, Suann Larry, MD, 40 mg at 03/06/22 7209   Patients Current Diet:  Diet Order                  Diet regular Room service appropriate? Yes with Assist; Fluid consistency: Thin  Diet effective now                         Precautions / Restrictions Precautions Precautions: Fall Restrictions Weight Bearing Restrictions: No    Has the patient had 2 or more falls or a fall with injury in the past year? Yes   Prior Activity Level Limited Community (1-2x/wk): indep at baseline without DME (family reports falls), driving locally   Prior Functional Level Self Care: Did the patient need help bathing, dressing, using the toilet or eating? Independent   Indoor Mobility: Did the patient need assistance with walking from room to room (with or without device)? Independent   Stairs: Did the patient need assistance with internal or external stairs (with or without device)? Independent   Functional Cognition: Did the patient need help planning regular tasks such as shopping or remembering to take medications? Needed some help   Patient Information Are you of Hispanic, Latino/a,or Spanish origin?: A. No, not of Hispanic, Latino/a, or Spanish origin What is your race?: A. White Do you need or want an interpreter to communicate with a doctor or health care staff?: 0. No   Patient's Response To:  Health Literacy and Transportation Is the patient able to respond to health literacy and transportation needs?: Yes Health Literacy - How often do you need to have someone help you when you read instructions, pamphlets, or other written material from your doctor or pharmacy?: Never In the past 12 months, has lack of transportation kept you from medical appointments  or from getting medications?: No In the past 12 months, has lack of transportation kept you from meetings, work, or from getting things needed for daily living?: No   Development worker, international aid / [place] Devices/Equipment: None Home Equipment: Rollator (4 wheels)   Prior Device Use: Indicate devices/aids used by the patient prior to current illness, exacerbation or injury? Walker   Current Functional Level Cognition   Arousal/Alertness: Awake/alert Overall Cognitive Status: History of cognitive impairments -  at baseline Orientation Level: Oriented X4 Safety/Judgement: Decreased awareness of deficits, Decreased awareness of safety General Comments: dementia at baseline Attention: Sustained Sustained Attention: Appears intact Awareness: Impaired Awareness Impairment: Intellectual impairment    Extremity Assessment (includes Sensation/Coordination)   Upper Extremity Assessment: RUE deficits/detail, LUE deficits/detail RUE Deficits / Details: 2+/5 should AROM, 3+/5 elbow distal. loosely gripping rw RUE Sensation: decreased light touch RUE Coordination: decreased fine motor, decreased gross motor LUE Sensation: WNL LUE Coordination: decreased fine motor, decreased gross motor  Lower Extremity Assessment: Defer to PT evaluation RLE Deficits / Details: grossly 3+/5     ADLs   Overall ADL's : Needs assistance/impaired Grooming: Wash/dry hands, Wash/dry face, Minimal assistance, Sitting Upper Body Bathing: Moderate assistance, Sitting Lower Body Bathing: Maximal assistance, Sit to/from stand Upper Body Dressing : Moderate assistance, Sitting Lower Body Dressing: Maximal assistance, Sit to/from stand Toilet Transfer: Minimal assistance, +2 for physical assistance Functional mobility during ADLs: Minimal assistance, Rolling walker (2 wheels) (side stepped along EOB) General ADL Comments: Cues for inititation. extra time and effort with bed mobility, cues for technique but  no physical assist needed. Min - mod A to stand and sidestep towards right. +lightheadedness.     Mobility   Overal bed mobility: Needs Assistance Bed Mobility: Supine to Sit, Sit to Supine Supine to sit: Min guard, HOB elevated Sit to supine: Min guard General bed mobility comments: close min guard, extra time and effort to come to EOB position. no physical assist needed this session.     Transfers   Overall transfer level: Needs assistance Equipment used: Rolling walker (2 wheels) Transfers: Sit to/from Stand Sit to Stand: Mod assist, Min assist Bed to/from chair/wheelchair/BSC transfer type:: Step pivot Step pivot transfers: Mod assist General transfer comment: Mod A on initial stand, min A to sit. utilized rw. multimodal cues for rw technique. Pt prefering to pull up on rw so assist for stabilizing rw.     Ambulation / Gait / Stairs / Wheelchair Mobility   Ambulation/Gait Ambulation/Gait assistance: Mod assist Gait Distance (Feet): 10 Feet Assistive device: Rolling walker (2 wheels) Gait Pattern/deviations: Decreased stride length, Trunk flexed, Leaning posteriorly, Shuffle, Step-through pattern General Gait Details: Poor balance reactions. Leaning posteriorly. Distance limited by fatigue. Gait velocity: decreased Gait velocity interpretation: <1.8 ft/sec, indicate of risk for recurrent falls     Posture / Balance Dynamic Sitting Balance Sitting balance - Comments: reliant on UE support. Initial right lean, able to self correct with time and effort. Balance Overall balance assessment: Needs assistance Sitting-balance support: No upper extremity supported, Feet supported Sitting balance-Leahy Scale: Fair Sitting balance - Comments: reliant on UE support. Initial right lean, able to self correct with time and effort. Postural control: Posterior lean Standing balance support: Bilateral upper extremity supported, During functional activity, Reliant on assistive device for  balance Standing balance-Leahy Scale: Poor Standing balance comment: rw and increased steadying assist for dynamic standing tasks     Special needs/care consideration      Previous Home Environment (from acute therapy documentation) Living Arrangements: Spouse/significant other  Lives With: Spouse Available Help at Discharge: Family, Available PRN/intermittently Type of Home: House Home Layout: One level Home Access: Stairs to enter Entrance Stairs-Rails: Right, Left Entrance Stairs-Number of Steps: 5 Bathroom Shower/Tub: Gaffer, Chiropodist: Standard Bathroom Accessibility: Yes How Accessible: Accessible via walker Home Care Services: No Additional Comments: pt helped husband last  weeks or so with B/D and clean him as well as help him get up as  he recently broke elbpow aftger jos radoatopm complete.   Discharge Living Setting Plans for Discharge Living Setting: Patient's home, Lives with (comment) (spouse) Type of Home at Discharge: House Discharge Home Layout: One level Discharge Home Access: Stairs to enter Entrance Stairs-Rails: Right, Left Entrance Stairs-Number of Steps: 5 Discharge Bathroom Shower/Tub: Walk-in shower, Tub/shower unit Discharge Bathroom Toilet: Standard Discharge Bathroom Accessibility: Yes How Accessible: Accessible via walker Does the patient have any problems obtaining your medications?: No   Social/Family/Support Systems Patient Roles: Spouse Anticipated Caregiver: children Lisabeth Pick, Reita Cliche), and other family members Anticipated Ambulance person Information: daughters usually at bedside, Mortimer Fries [phone] Ability/Limitations of Caregiver: n/a Caregiver Availability: 24/7 Discharge Plan Discussed with Primary Caregiver: Yes Is Caregiver In Agreement with Plan?: Yes Does Caregiver/Family have Issues with Lodging/Transportation while Pt is in Rehab?: No   Goals Patient/Family Goal for Rehab: PT/OT/SLP  supervision Expected length of stay: 10-12 days Pt/Family Agrees to Admission and willing to participate: Yes Program Orientation Provided & Reviewed with Pt/Caregiver Including Roles  & Responsibilities: Yes  Barriers to Discharge: Insurance for SNF coverage   Decrease burden of Care through IP rehab admission: NA   Possible need for SNF placement upon discharge: Not anticipated   Patient Condition: I have reviewed medical records from [facility], spoken with CM, and patient, son, and daughter. I met with patient at the bedside for inpatient rehabilitation assessment.  Patient will benefit from ongoing PT, OT, and SLP, can actively participate in 3 hours of therapy a day 5 days of the week, and can make measurable gains during the admission.  Patient will also benefit from the coordinated team approach during an Inpatient Acute Rehabilitation admission.  The patient will receive intensive therapy as well as Rehabilitation physician, nursing, social worker, and care management interventions.  Due to safety, disease management, medication administration, pain management, and patient education the patient requires 24 hour a day rehabilitation nursing.  The patient is currently min A- mod A with mobility and basic ADLs.  Discharge setting and therapy post discharge at home with home health is anticipated.  Patient has agreed to participate in the Acute Inpatient Rehabilitation Program and will admit today.   Preadmission Screen Completed By:  Shann Medal, PT, DPT 03/06/2022 12:55 PM with updates by Clemens Catholic, MS, CCC-SLP   ______________________________________________________________________   Discussed status with Dr. Ranell Patrick  on 03/07/22 at 9  and received approval for admission 03/07/22.    Admission Coordinator:  Shann Medal, PT, DPT time 1500 /Date 03/07/22    Assessment/Plan: Diagnosis: Acute ischemic stroke Does the need for close, 24 hr/day Medical supervision in concert with  the patient's rehab needs make it unreasonable for this patient to be served in a less intensive setting? Yes Co-Morbidities requiring supervision/potential complications: essential HTN, stage 3a CKD, depression, hypothyroidism, acute urinary retention Due to bladder management, bowel management, safety, skin/wound care, disease management, medication administration, pain management, and patient education, does the patient require 24 hr/day rehab nursing? Yes Does the patient require coordinated care of a physician, rehab nurse, PT, OT, and SLP to address physical and functional deficits in the context of the above medical diagnosis(es)? Yes Addressing deficits in the following areas: balance, endurance, locomotion, strength, transferring, bowel/bladder control, bathing, dressing, feeding, grooming, toileting, cognition, speech, and psychosocial support Can the patient actively participate in an intensive therapy program of at least 3 hrs of therapy 5 days a week? Yes The potential for patient to make measurable gains while on inpatient rehab is  excellent Anticipated functional outcomes upon discharge from inpatient rehab: supervision PT, supervision OT, supervision SLP Estimated rehab length of stay to reach the above functional goals is: 7-10 days Anticipated discharge destination: Home 10. Overall Rehab/Functional Prognosis: excellent     MD Signature: Leeroy Cha, MD

## 2022-03-08 NOTE — Progress Notes (Signed)
PROGRESS NOTE   Subjective/Complaints:  Tearful wants to go home, she misses her husband who does not like hospitals and prefers not to visit ROS- neg CP, SOB, N/V/D Objective:   No results found. Recent Labs    03/07/22 0535 03/08/22 0551  WBC 7.4 8.4  HGB 12.5 12.6  HCT 36.0 36.3  PLT 245 268   Recent Labs    03/07/22 0535 03/08/22 0551  NA 136 138  K 3.5 3.8  CL 102 101  CO2 23 26  GLUCOSE 104* 109*  BUN 18 26*  CREATININE 1.19* 1.34*  CALCIUM 9.2 9.3    Intake/Output Summary (Last 24 hours) at 03/08/2022 0849 Last data filed at 03/08/2022 0806 Gross per 24 hour  Intake 120 ml  Output --  Net 120 ml        Physical Exam: Vital Signs Blood pressure (!) 155/68, pulse 63, temperature 97.9 F (36.6 C), resp. rate 18, weight 59.2 kg, SpO2 99 %.   General: No acute distress Mood and affect are appropriate Heart: Regular rate and rhythm no rubs murmurs or extra sounds Lungs: Clear to auscultation, breathing unlabored, no rales or wheezes Abdomen: Positive bowel sounds, soft nontender to palpation, nondistended Extremities: No clubbing, cyanosis, or edema Skin: No evidence of breakdown, no evidence of rash Neurologic: Cranial nerves II through XII intact, motor strength is 5/5 in bilateral deltoid, bicep, tricep, grip, hip flexor, knee extensors, ankle dorsiflexor and plantar flexor Sensory exam normal sensation to light touch and proprioception in bilateral upper and lower extremities Cerebellar exam normal finger to nose to finger as well as heel to shin in bilateral upper and lower extremities Musculoskeletal: Full range of motion in all 4 extremities. No joint swelling   Assessment/Plan: 1. Functional deficits which require 3+ hours per day of interdisciplinary therapy in a comprehensive inpatient rehab setting. Physiatrist is providing close team supervision and 24 hour management of active medical  problems listed below. Physiatrist and rehab team continue to assess barriers to discharge/monitor patient progress toward functional and medical goals  Care Tool:  Bathing              Bathing assist       Upper Body Dressing/Undressing Upper body dressing        Upper body assist      Lower Body Dressing/Undressing Lower body dressing            Lower body assist       Toileting Toileting    Toileting assist Assist for toileting: Moderate Assistance - Patient 50 - 74%     Transfers Chair/bed transfer  Transfers assist     Chair/bed transfer assist level: Moderate Assistance - Patient 50 - 74%     Locomotion Ambulation   Ambulation assist              Walk 10 feet activity   Assist           Walk 50 feet activity   Assist           Walk 150 feet activity   Assist           Walk 10 feet on uneven  surface  activity   Assist           Wheelchair     Assist               Wheelchair 50 feet with 2 turns activity    Assist            Wheelchair 150 feet activity     Assist          Blood pressure (!) 155/68, pulse 63, temperature 97.9 F (36.6 C), resp. rate 18, weight 59.2 kg, SpO2 99 %.  Medical Problem List and Plan: 1. Functional deficits secondary to large left basal ganglia infarct             -patient may not shower due to loop recorder placement, x days              -ELOS/Goals: 7-10 days S             -Admit to CIR 2.  Antithrombotics: -DVT/anticoagulation:  Pharmaceutical: Lovenox             -antiplatelet therapy: DAPT X 3 weeks followed by Plavix alone 3. Chronic low back pain: Tylenol prn for chronic LBP. Add kpad. 4. Mood: LCSW to follow for evaluation and support.              -antipsychotic agents: N/A 5. Neuropsych: This patient maybe capable of making decisions on her own behalf. 6. Skin/Wound Care: Routine pressure relief measures.  7.  Fluids/Electrolytes/Nutrition: Monitor I/O. Check CMET in am. 8. HTN: Monitor BP TID--continue Torsemide, Losartan and Coreg.  Vitals:   03/08/22 0530  BP: (!) 155/68  Pulse: 63  Resp: 18  Temp: 97.9 F (36.6 C)  SpO2: 99%   9.  HFrEF/New CM: No signs of overload.  Continue Low salt diet. Strict I/O w/daily wts             --monitor for signs of overload.   Ej fx 30-35%             --on Torsemide, Losartan, Coreg, ASA and atorvastatin.             --plans for GDMT in the future.  10. Hypothyroid: On levothyroxine] 11. H/o depression: Continue Sertraline and Mirtazapine             --ativan bid prn as at home. 12. CKD:  BUN/SCr- 28/1.14 @ admission-->improved with IVF--18/1.19 --Avoid nephrotoxic  medications 13. Pre-diabetes: Hgb A1C-6.0. Dietary education.     LOS: 1 days A FACE TO FACE EVALUATION WAS PERFORMED  Erick Colace 03/08/2022, 8:49 AM

## 2022-03-08 NOTE — Progress Notes (Signed)
Inpatient Rehabilitation Care Coordinator Assessment and Plan Patient Details  Name: Tamara Bullock MRN: [id number] Date of Birth: Dec 08, 1940  Today's Date: 03/08/2022  Hospital Problems: Principal Problem:   Basal ganglia stroke [facility])  Past Medical History:  Past Medical History:  Diagnosis Date   Essential hypertension    Hyperlipidemia    Past Surgical History: History reviewed. No pertinent surgical history. Social History:  reports that she has never smoked. She has never been exposed to tobacco smoke. She has never used smokeless tobacco. No history on file for alcohol use and drug use.  Family / Support Systems Spouse/Significant Other: Lawrence Marse[place] Children: Ursula Alert, (Son), Lisabeth Pick (Daughter) Anticipated Caregiver: Children Lisabeth Pick, Reita Cliche) and other family members Ability/Limitations of Caregiver: n/a Caregiver Availability: 24/7 Family Dynamics: support from spouse and children  Social History Preferred language: English Religion: [employer] - How often do you need to have someone help you when you read instructions, pamphlets, or other written material from your doctor or pharmacy?: Never Writes: Yes   Abuse/Neglect Abuse/Neglect Assessment Can Be Completed: Yes Physical Abuse: Denies Verbal Abuse: Denies Sexual Abuse: Denies Exploitation of patient/patient's resources: Denies Self-Neglect: Denies Possible abuse reported to:: Other (Comment)  Patient response to: Social Isolation - How often do you feel lonely or isolated from those around you?: Rarely  Emotional Status Recent Psychosocial Issues: coping  Patient / Family Perceptions, Expectations & Goals Pt/Family understanding of illness & functional limitations: yes Premorbid pt/family roles/activities: Patient previously independent at baseline without DME (freq falls), driving Anticipated changes in roles/activities/participation: will require some assistance with Stairs, gait, ADLS,  Cognition Pt/family expectations/goals: Supervision  [place] Agencies: None Premorbid Home Care/DME Agencies: Other (Comment) Agricultural consultant) Transportation available at discharge: children able to transport Is the patient able to respond to transportation needs?: Yes In the past 12 months, has lack of transportation kept you from medical appointments or from getting medications?: No In the past 12 months, has lack of transportation kept you from meetings, work, or from getting things needed for daily living?: No  Discharge Planning Living Arrangements: Spouse/significant other Support Systems: Children, Spouse/significant other Type of Residence: Private residence Insurance Resources: Multimedia programmer (specify) (BCBS Medicare) Financial Resources: Family Support Financial Screen Referred: No Living Expenses: Lives with family Money Management: Patient, Spouse Does the patient have any problems obtaining your medications?: No Home Management: Independent Patient/Family Preliminary Plans: Family able to assist with roles and tasks Care Coordinator Anticipated Follow Up Needs: HH/OP Expected length of stay: 10-12 Days  Clinical Impression SW made attempt to see patient, with therapies. Sw will continue to follow up with patient.  Dyanne Iha 03/08/2022, 1:37 PM

## 2022-03-08 NOTE — Progress Notes (Signed)
[facility] Individual Statement of Services  Patient Name:  Tamara Bullock  Date:  03/08/2022  Welcome to the [facility].  Our goal is to provide you with an individualized program based on your diagnosis and situation, designed to meet your specific needs.  With this comprehensive rehabilitation program, you will be expected to participate in at least 3 hours of rehabilitation therapies Monday-Friday, with modified therapy programming on the weekends.  Your rehabilitation program will include the following services:  Physical Therapy (PT), Occupational Therapy (OT), Speech Therapy (ST), 24 hour per day rehabilitation nursing, Therapeutic Recreaction (TR), Neuropsychology, Care Coordinator, Rehabilitation Medicine, Nutrition Services, Pharmacy Services, and Other  Weekly team conferences will be held on Wednesdays to discuss your progress.  Your Inpatient Rehabilitation Care Coordinator will talk with you frequently to get your input and to update you on team discussions.  Team conferences with you and your family in attendance may also be held.  Expected length of stay: 10-12 Days  Overall anticipated outcome: Supervision  Depending on your progress and recovery, your program may change. Your Inpatient Rehabilitation Care Coordinator will coordinate services and will keep you informed of any changes. Your Inpatient Rehabilitation Care Coordinator's name and contact numbers are listed  below.  The following services may also be recommended but are not provided by the [facility]:   [place] will be made to provide these services after discharge if needed.  Arrangements include referral to agencies that provide these services.  Your insurance has been verified to be:   [employer] Your primary doctor is:  NO PCP  Pertinent information will be shared  with your doctor and your insurance company.  Inpatient Rehabilitation Care Coordinator:  Erlene Quan, [place] or [phone]  Information discussed with and copy given to patient by: Dyanne Iha, 03/08/2022, 12:23 PM

## 2022-03-08 NOTE — Plan of Care (Signed)
Problem: RH Balance Goal: LTG Patient will maintain dynamic standing with ADLs (OT) Description: LTG:  Patient will maintain dynamic standing balance with assist during activities of daily living (OT)  03/08/2022 1551 by Vicenta Dunning, OT Flowsheets (Taken 03/08/2022 1551) LTG: Pt will maintain dynamic standing balance during ADLs with: Supervision/Verbal cueing 03/08/2022 1544 by Vicenta Dunning, OT Flowsheets (Taken 03/08/2022 1544) LTG: Pt will maintain dynamic standing balance during ADLs with: Independent with assistive device   Problem: Sit to Stand Goal: LTG:  Patient will perform sit to stand in prep for activites of daily living with assistance level (OT) Description: LTG:  Patient will perform sit to stand in prep for activites of daily living with assistance level (OT) 03/08/2022 1551 by Vicenta Dunning, OT Flowsheets (Taken 03/08/2022 1551) LTG: PT will perform sit to stand in prep for activites of daily living with assistance level: Supervision/Verbal cueing 03/08/2022 1544 by Vicenta Dunning, OT Flowsheets (Taken 03/08/2022 1544) LTG: PT will perform sit to stand in prep for activites of daily living with assistance level: Independent with assistive device   Problem: RH Grooming Goal: LTG Patient will perform grooming w/assist,cues/equip (OT) Description: LTG: Patient will perform grooming with assist, with/without cues using equipment (OT) Flowsheets (Taken 03/08/2022 1544) LTG: Pt will perform grooming with assistance level of: Independent with assistive device    Problem: RH Dressing Goal: LTG Patient will perform upper body dressing (OT) Description: LTG Patient will perform upper body dressing with assist, with/without cues (OT). Flowsheets (Taken 03/08/2022 1544) LTG: Pt will perform upper body dressing with assistance level of: Independent with assistive device Goal: LTG Patient will perform lower body dressing w/assist (OT) Description: LTG: Patient will perform lower body dressing  with assist, with/without cues in positioning using equipment (OT) 03/08/2022 1551 by Vicenta Dunning, OT Flowsheets (Taken 03/08/2022 1551) LTG: Pt will perform lower body dressing with assistance level of: Supervision/Verbal cueing 03/08/2022 1544 by Vicenta Dunning, OT Flowsheets (Taken 03/08/2022 1544) LTG: Pt will perform lower body dressing with assistance level of: Independent with assistive device   Problem: RH Toileting Goal: LTG Patient will perform toileting task (3/3 steps) with assistance level (OT) Description: LTG: Patient will perform toileting task (3/3 steps) with assistance level (OT)  03/08/2022 1551 by Vicenta Dunning, OT Flowsheets (Taken 03/08/2022 1551) LTG: Pt will perform toileting task (3/3 steps) with assistance level: Supervision/Verbal cueing 03/08/2022 1544 by Vicenta Dunning, OT Flowsheets (Taken 03/08/2022 1544) LTG: Pt will perform toileting task (3/3 steps) with assistance level: Independent with assistive device   Problem: RH Functional Use of Upper Extremity Goal: LTG Patient will use RT/LT upper extremity as a (OT) Description: LTG: Patient will use right/left upper extremity as a stabilizer/gross assist/diminished/nondominant/dominant level with assist, with/without cues during functional activity (OT) Flowsheets (Taken 03/08/2022 1544) LTG: Use of upper extremity in functional activities: RUE as dominant level LTG: Pt will use upper extremity in functional activity with assistance level of: Independent   Problem: RH Simple Meal Prep Goal: LTG Patient will perform simple meal prep w/assist (OT) Description: LTG: Patient will perform simple meal prep with assistance, with/without cues (OT). 03/08/2022 1551 by Vicenta Dunning, OT Flowsheets (Taken 03/08/2022 1551) LTG: Pt will perform simple meal prep with assistance level of: Supervision/Verbal cueing 03/08/2022 1544 by Vicenta Dunning, OT Flowsheets (Taken 03/08/2022 1544) LTG: Pt will perform simple meal prep with assistance  level of: Independent with assistive device   Problem: RH Laundry Goal: LTG Patient will perform laundry  w/assist, cues (OT) Description: LTG: Patient will perform laundry with assistance, with/without cues (OT). 03/08/2022 1551 by Vicenta Dunning, OT Flowsheets (Taken 03/08/2022 1551) LTG: Pt will perform laundry with assistance level of: Supervision/Verbal cueing 03/08/2022 1544 by Vicenta Dunning, OT Flowsheets (Taken 03/08/2022 1544) LTG: Pt will perform laundry with assistance level of: Independent with assistive device   Problem: RH Light Housekeeping Goal: LTG Patient will perform light housekeeping w/assist (OT) Description: LTG: Patient will perform light housekeeping with assistance, with/without cues (OT). 03/08/2022 1551 by Vicenta Dunning, OT Flowsheets (Taken 03/08/2022 1551) LTG: Pt will perform light housekeeping with assistance level of: Supervision/Verbal cueing 03/08/2022 1544 by Vicenta Dunning, OT Flowsheets (Taken 03/08/2022 1544) LTG: Pt will perform light housekeeping with assistance level of: Independent with assistive device   Problem: RH Toilet Transfers Goal: LTG Patient will perform toilet transfers w/assist (OT) Description: LTG: Patient will perform toilet transfers with assist, with/without cues using equipment (OT) 03/08/2022 1551 by Vicenta Dunning, OT Flowsheets (Taken 03/08/2022 1551) LTG: Pt will perform toilet transfers with assistance level of: Independent with assistive device 03/08/2022 1544 by Vicenta Dunning, OT Flowsheets (Taken 03/08/2022 1544) LTG: Pt will perform toilet transfers with assistance level of: Independent with assistive device   Problem: RH Tub/Shower Transfers Goal: LTG Patient will perform tub/shower transfers w/assist (OT) Description: LTG: Patient will perform tub/shower transfers with assist, with/without cues using equipment (OT) 03/08/2022 1553 by Vicenta Dunning, OT Flowsheets (Taken 03/08/2022 1551) LTG: Pt will perform tub/shower transfers from:  Walk in shower 03/08/2022 1551 by Vicenta Dunning, OT Flowsheets (Taken 03/08/2022 1551) LTG: Pt will perform tub/shower stall transfers with assistance level of: Supervision/Verbal cueing LTG: Pt will perform tub/shower transfers from: Walk in shower 03/08/2022 1544 by Vicenta Dunning, OT Flowsheets (Taken 03/08/2022 1544) LTG: Pt will perform tub/shower stall transfers with assistance level of: Independent with assistive device

## 2022-03-08 NOTE — Progress Notes (Signed)
Patient ID: Tamara Bullock, female   DOB: [birth year], 81 y.o.   MRN: [id number] Met with the patient, and 2 daughters to review rehab process, team conference and plan of care. Reviewed secondary risk including prediabetes, (A1C 6.0) HTn, HLD and HF. Discussed dietary modifications recommended along with medications including DAPT x 3 weeks then Plavix solo. Reviewed zone tool for HF and HH diet. Confirmed that patient does have a PCP; updated Epic. Continue to follow along to discharge to address educational needs to facilitate preparation for discharge. Margarito Liner

## 2022-03-08 NOTE — Evaluation (Signed)
Physical Therapy Assessment and Plan  Patient Details  Name: Tamara Bullock MRN: [id number] Date of Birth: 1940-10-02  PT Diagnosis: Abnormality of gait, Cognitive deficits, Difficulty walking, Hemiparesis dominant, Impaired cognition, and Muscle weakness Rehab Potential: Good ELOS: 7-9 days   Today's Date: 03/08/2022 PT Individual Time: 1345-1455 PT Individual Time Calculation (min): 70 min    Hospital Problem: Principal Problem:   Basal ganglia stroke ([place])   Past Medical History:  Past Medical History:  Diagnosis Date   Essential hypertension    Hyperlipidemia    Past Surgical History: History reviewed. No pertinent surgical history.  Assessment & Plan Clinical Impression: Patient is an 81 year old RH female with history of HTN, CKD, GAD/depression, STM deficits in the past few months;  who was admitted on 03/02/22 with sudden onset of aphasia  and right sided weakness. MRI/MRA brain don revealing acute 2.5 cm left basal ganglia infarct with age related volume loss and no significant ICA stenosis. 2D echo done revealing EF 30-35% with global hypokinesis and  and noted to have sinus bradycardia with HR in upper 50's. Stroke felt to be embolic from cryptogenic source and Dr. Leonie Man recommended DAPT X 3 weeks followed by Plavix indefinitely. Cardiology consulted for ILR to rule out A fib. ST/PT/OT has been working with patient who is limited by anomia, expressive aphasia, dizziness with positional changes, right sided weakness with shuffling gait and balance deficits. CIR recommended due to functional decline. Daughter would like to stay overnight.  Patient transferred to CIR on 03/07/2022 .   Patient currently requires min with mobility secondary to muscle weakness, decreased cardiorespiratoy endurance, decreased awareness, decreased problem solving, decreased safety awareness, and decreased memory, and decreased standing balance, hemiplegia, and decreased balance strategies.  Prior to  hospitalization, patient was independent  with mobility and lived with Spouse in a House home.  Home access is 5Stairs to enter.  Patient will benefit from skilled PT intervention to maximize safe functional mobility, minimize fall risk, and decrease caregiver burden for planned discharge home with 24 hour supervision.  Anticipate patient will benefit from follow up [facility] at discharge.  PT - End of Session Activity Tolerance: Tolerates < 10 min activity, no significant change in vital signs Endurance Deficit: Yes Endurance Deficit Description: Requires rest breaks during functional mobility tasks. PT Assessment Rehab Potential (ACUTE/IP ONLY): Good PT Barriers to Discharge: Decreased caregiver support;Insurance for SNF coverage PT Patient demonstrates impairments in the following area(s): Balance;Endurance;Safety PT Transfers Functional Problem(s): Bed Mobility;Bed to Chair;Car PT Locomotion Functional Problem(s): Ambulation;Stairs PT Plan PT Intensity: Minimum of 1-2 x/day ,45 to 90 minutes PT Frequency: 5 out of 7 days PT Duration Estimated Length of Stay: 7-9 days PT Treatment/Interventions: Ambulation/gait training;Discharge planning;Functional mobility training;Psychosocial support;Therapeutic Activities;Visual/perceptual remediation/compensation;Therapeutic Exercise;Skin care/wound management;Neuromuscular re-education;Balance/vestibular training;Wheelchair propulsion/positioning;Disease management/prevention;Cognitive remediation/compensation;DME/adaptive equipment instruction;Splinting/orthotics;Pain management;UE/LE Art therapist reintegration;Functional electrical stimulation PT Transfers Anticipated Outcome(s): supervision PT Locomotion Anticipated Outcome(s): supervision PT Recommendation Follow Up Recommendations: Home health PT;24 hour supervision/assistance Patient destination: Home Equipment  Recommended: To be determined   PT Evaluation Precautions/Restrictions Precautions Precautions: Fall Precaution Comments: Mild expressive aphasia Restrictions Weight Bearing Restrictions: No General   Vital Signs Pain Pain Assessment Pain Scale: 0-10 Pain Score: 0-No pain Pain Interference Pain Interference Pain Effect on Sleep: 0. Does not apply - I have not had any pain or hurting in the past 5 days Pain Interference with Therapy Activities: 0. Does not apply - I have not received rehabilitationtherapy in the past 5 days  Pain Interference with Day-to-Day Activities: 1. 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 but prior both were indep with A/IADL's  Lives With: Spouse Prior Function Level of Independence: Independent with basic ADLs;Independent with homemaking with ambulation;Independent with gait;Independent with transfers  Able to Take Stairs?: Yes Driving: Yes Vocation: Retired Leisure: Hobbies-yes (Comment) (gardening) Vision/Perception  Vision - History Ability to See in Adequate Light: 0 Adequate Perception Perception: Within Functional Limits Praxis Praxis: Impaired Praxis Impairment Details: Motor planning  Cognition Overall Cognitive Status: Impaired/Different from baseline Arousal/Alertness: Awake/alert Orientation Level: Oriented X4 Attention: Sustained;Focused;Selective Focused Attention: Appears intact Focused Attention Impairment: Functional complex Sustained Attention:  Appears intact Selective Attention: Impaired Selective Attention Impairment: Verbal complex;Functional complex Memory: Impaired Memory Impairment: Decreased short term memory Decreased Short Term Memory: Functional complex Awareness: Impaired Awareness Impairment: Anticipatory impairment;Intellectual impairment Problem Solving: Impaired Problem Solving Impairment: Functional complex Executive Function: Decision Making Decision Making: Impaired Decision Making Impairment: Functional complex Behaviors: Lability;Other (comment) (tearful, needs encouragement, has perspective of being somewhat of a burden) Safety/Judgment: Impaired (mild) Comments: reported she just wants to go home and needs reinforcement that she will benefit greatly from program Sensation Sensation Light Touch: Appears Intact Hot/Cold: Appears Intact Proprioception: Appears Intact Stereognosis: Not tested Additional Comments: able to manipulate a retrieve items from travel kit Coordination Gross Motor Movements are Fluid and Coordinated: No Fine Motor Movements are Fluid and Coordinated: No Coordination and Movement Description: decreased graded control Finger Nose Finger Test: impaired R sided 9 Hole Peg Test: TBA Motor  Motor Motor: Hemiplegia Motor - Skilled Clinical Observations: R sided weakness with mild deficits but increased time and processing for motor output, slowed fingers to thumb R > L   Trunk/Postural Assessment  Cervical Assessment Cervical Assessment: Within Functional Limits Thoracic Assessment Thoracic Assessment: Within Functional Limits Lumbar Assessment Lumbar Assessment: Within Functional Limits Postural Control Postural Control: Within Functional Limits Trunk Control: mild proximal weakness Righting Reactions: unable to reach OBOS during standing level LB garment management R>L deficits  Balance Balance Balance Assessed: Yes Static Sitting Balance Static Sitting - Balance  Support: No upper extremity supported;Feet supported Static Sitting - Level of Assistance: 5: Stand by assistance Dynamic Sitting Balance Dynamic Sitting - Balance Support: Feet supported;No upper extremity supported Dynamic Sitting - Level of Assistance: 4: Min assist Dynamic Sitting - Balance Activities: Reaching across midline;Reaching for objects Sitting balance - Comments: crossed leg for sock donning with increased difficulty with R LE Static Standing Balance Static Standing - Balance Support: Bilateral upper extremity supported Static Standing - Level of Assistance: 4: Min assist Static Standing - Comment/# of Minutes: >1 min for LB garment management Dynamic Standing Balance Dynamic Standing - Balance Support: Bilateral upper extremity supported;During functional activity Dynamic Standing - Level of Assistance: 4: Min assist Dynamic Standing - Comments: LB self care wiht supported standing Extremity Assessment  RUE Assessment RUE Assessment: Exceptions to [facility] General Strength Comments: mild R UE gross weakness RUE Body System: Neuro RUE Strength RUE Overall Strength: Deficits RUE Overall Strength Comments: 3+/5 LUE Assessment LUE Assessment: Within Functional Limits RLE Assessment RLE Assessment: Exceptions to [facility] General Strength Comments: Grossly 4/5 LLE Assessment LLE Assessment: Within  Functional Limits  Care Tool Care Tool Bed Mobility Roll left and right activity   Roll left and right assist level: Supervision/Verbal cueing    Sit to lying activity   Sit to lying assist level: Contact Guard/Touching assist    Lying to sitting on side of bed activity   Lying to sitting on side of bed assist level: the ability to move from lying on the back to sitting on the side of the bed with no back support.: Contact Guard/Touching assist     Care Tool Transfers Sit to stand transfer   Sit to stand assist level: Minimal Assistance - Patient > 75%    Chair/bed transfer    Chair/bed transfer assist level: Minimal Assistance - Patient > 75%     Toilet transfer   Assist Level: Moderate Assistance - Patient 50 - 74%    Car transfer Car transfer activity did not occur: Safety/medical concerns        Care Tool Locomotion Ambulation   Assist level: Minimal Assistance - Patient > 75% Assistive device: Walker-rolling Max distance: 64f  Walk 10 feet activity   Assist level: Minimal Assistance - Patient > 75% Assistive device: Walker-rolling   Walk 50 feet with 2 turns activity Walk 50 feet with 2 turns activity did not occur: Safety/medical concerns (fatigue, orthostatic)      Walk 150 feet activity Walk 150 feet activity did not occur: Safety/medical concerns      Walk 10 feet on uneven surfaces activity Walk 10 feet on uneven surfaces activity did not occur: Safety/medical concerns      Stairs   Assist level: Minimal Assistance - Patient > 75% Stairs assistive device: 2 hand rails Max number of stairs: 4  Walk up/down 1 step activity   Walk up/down 1 step (curb) assist level: Minimal Assistance - Patient > 75% Walk up/down 1 step or curb assistive device: 2 hand rails  Walk up/down 4 steps activity   Walk up/down 4 steps assist level: Minimal Assistance - Patient > 75% Walk up/down 4 steps assistive device: 2 hand rails  Walk up/down 12 steps activity Walk up/down 12 steps activity did not occur: Safety/medical concerns      Pick up small objects from floor Pick up small object from the floor (from standing position) activity did not occur: Safety/medical concerns      Wheelchair Is the patient using a wheelchair?: No          Wheel 50 feet with 2 turns activity      Wheel 150 feet activity        Refer to Care Plan for Long Term Goals  SHORT TERM GOAL WEEK 1 PT Short Term Goal 1 (Week 1): STG = LTG due to ELOS  Recommendations for other services: None   Skilled Therapeutic Intervention Mobility Bed Mobility Bed Mobility:  Rolling Right;Rolling Left;Left Sidelying to Sit;Sit to Sidelying Left Rolling Right: Supervision/verbal cueing Rolling Left: Supervision/Verbal cueing Left Sidelying to Sit: Contact Guard/Touching assist Sit to Sidelying Left: Contact Guard/Touching assist Transfers Transfers: Stand Pivot Transfers Stand Pivot Transfers: Minimal Assistance - Patient > 75% Stand Pivot Transfer Details: Verbal cues for technique;Verbal cues for gait pattern;Verbal cues for sequencing;Verbal cues for precautions/safety;Verbal cues for safe use of DME/AE Stand Pivot Transfer Details (indicate cue type and reason): fearful initially with all SPT's, cues for hand and foot placement, decreased motor planning Transfer (Assistive device): None Locomotion  Gait Ambulation: Yes Gait Assistance: Minimal Assistance - Patient > 75%;Contact Guard/Touching assist  Gait Distance (Feet): 35 Feet Assistive device: Rolling walker Gait Assistance Details: Verbal cues for technique;Verbal cues for gait pattern;Verbal cues for precautions/safety;Tactile cues for weight shifting Gait Gait: Yes Gait Pattern: Impaired Gait Pattern: Step-through pattern;Decreased stride length;Narrow base of support Gait velocity: decreased Stairs / Additional Locomotion Stairs: Yes Wheelchair Mobility Wheelchair Mobility: No  Skilled Intervention: Pt supine in bed to start - agreeable to PT evaluation. Denies pain. No family at bedside to assist with PLOF and social factors. Pt with mild expressive aphasia and some stuttering - stuttering improved as session progressed. Bed mobility completed with CGA without hospital bed features. Sitting balance supervision without UE support. Donned slip-on shoes with totalA for time. MinA for stand<>pivot transfers with no AD. Transported in w/c to main rehab gym for time. Gait training 27f + 511fwith CGA/minA and RW - narrow BOS, downward gaze, decreased gait speed. Gait distance limited by fatigue. Stair  training up/down x4 steps using 2 hand rails and minA for balance. Pt c/o dizziness and lightheadness after stairs. BP assessed  Sitting:136/48 Standing: 100/53 *Pt symptomatic  Messaged PA and LPN to notify.  Pt assisted onto Nust[place] with minA stand<>pivot transfer using RW - will require further ed on safety awareness with RW management during functional mobiliy. Completed 4 minutes with resistance set to 5, using BUE/BLE, to target activity tolerance and cardiovascular endurance.  Returned to her room and assisted back to bed with minA stand<>pivot transfer. Bed mobility completed CGA. Bed alarm on, all needs met.      Discharge Criteria: Patient will be discharged from PT if patient refuses treatment 3 consecutive times without medical reason, if treatment goals not met, if there is a change in medical status, if patient makes no progress towards goals or if patient is discharged from hospital.  The above assessment, treatment plan, treatment alternatives and goals were discussed and mutually agreed upon: by patient  ChTempleT 03/08/2022, 2:46 PM

## 2022-03-08 NOTE — Evaluation (Signed)
Occupational Therapy Assessment and Plan  Patient Details  Name: MANUEL LAWHEAD MRN: [id number] Date of Birth: 04/20/1941  OT Diagnosis: apraxia, cognitive deficits, hemiplegia affecting dominant side, and muscle weakness (generalized) Rehab Potential: Rehab Potential (ACUTE ONLY): Good ELOS: 7-10 days   Today's Date: 03/08/2022 OT Individual Time: 0900-1015 OT Individual Time Calculation (min): 75 min     Hospital Problem: Principal Problem:   Basal ganglia stroke ([place])   Past Medical History:  Past Medical History:  Diagnosis Date   Essential hypertension    Hyperlipidemia    Past Surgical History: History reviewed. No pertinent surgical history.  Assessment & Plan Clinical Impression: Patient is an 81 year old RH female with history of HTN, CKD, GAD/depression, STM deficits in the past few months;  who was admitted on 03/02/22 with sudden onset of aphasia  and right sided weakness. MRI/MRA brain don revealing acute 2.5 cm left basal ganglia infarct with age related volume loss and no significant ICA stenosis. 2D echo done revealing EF 30-35% with global hypokinesis and  and noted to have sinus bradycardia with HR in upper 50's. Stroke felt to be embolic from cryptogenic source and Dr. Leonie Man recommended DAPT X 3 weeks followed by Plavix indefinitely. Cardiology consulted for ILR to rule out A fib. ST/PT/OT has been working with patient who is limited by anomia, expressive aphasia, dizziness with positional changes, right sided weakness with shuffling gait and balance deficits. CIR recommended due to functional decline. Daughter would like to stay overnight. Patient transferred to CIR on 03/07/2022 .    Patient currently requires mod with basic self-care skills and IADL secondary to muscle weakness, decreased cardiorespiratoy endurance, impaired timing and sequencing, motor apraxia, decreased coordination, and decreased motor planning, and decreased problem solving, decreased memory, and  delayed processing as well as communication/language deficits.   Prior to hospitalization, patient could complete all aspects of ADL's and IADL's including grocery shopping, bill paying and household management tasks with independent level. As per history pt had some stress induced cognitive limitations but was performing the former independently.   Patient will benefit from skilled intervention to decrease level of assist with basic self-care skills, increase independence with basic self-care skills, and increase level of independence with iADL prior to discharge home with care partner.  Anticipate patient will require 24 hour supervision and follow up home health.  OT - End of Session Activity Tolerance: Tolerates 10 - 20 min activity with multiple rests Endurance Deficit: Yes Endurance Deficit Description: Requires rest breaks during functional mobility tasks. OT Assessment Rehab Potential (ACUTE ONLY): Good OT Barriers to Discharge: Home environment access/layout OT Barriers to Discharge Comments: 5 steps with rail to enter OT Patient demonstrates impairments in the following area(s): Balance;Motor;Cognition;Safety;Endurance;Other (Comment) (emotional lability and reports of depressive symptoms) OT Basic ADL's Functional Problem(s): Grooming;Bathing;Dressing;Toileting OT Advanced ADL's Functional Problem(s): Simple Meal Preparation;Laundry;Light Housekeeping OT Transfers Functional Problem(s): Toilet;Tub/Shower OT Additional Impairment(s): Fuctional Use of Upper Extremity OT Plan OT Intensity: Minimum of 1-2 x/day, 45 to 90 minutes OT Frequency: 5 out of 7 days OT Duration/Estimated Length of Stay: 7-10 days OT Treatment/Interventions: Balance/vestibular training;Community reintegration;Disease mangement/prevention;Neuromuscular re-education;Patient/family education;Self Care/advanced ADL retraining;Therapeutic Exercise;UE/LE Coordination activities;Wheelchair  propulsion/positioning;Cognitive remediation/compensation;Discharge planning;DME/adaptive equipment instruction;Functional mobility training;Psychosocial support;Therapeutic Activities;UE/LE Strength taining/ROM OT Self Feeding Anticipated Outcome(s): mod I OT Basic Self-Care Anticipated Outcome(s): Supervision OT Toileting Anticipated Outcome(s): Supervision OT Bathroom Transfers Anticipated Outcome(s): Supervision OT Recommendation Recommendations for Other Services: Therapeutic Recreation consult Therapeutic Recreation Interventions: Pet therapy;Stress management;Outing/community reintergration Patient destination: Home Follow  Up Recommendations: 24 hour supervision/assistance;Home health OT Equipment Recommended: 3 in 1 bedside comode;Tub/shower seat Equipment Details: pt reports having RW and w/c already in home   OT Evaluation Precautions/Restrictions  Precautions Precautions: Fall Precaution Comments: Mild expressive aphasia Restrictions Weight Bearing Restrictions: No General Chart Reviewed: Yes Family/Caregiver Present: Yes (daughter) Vital Signs  Pain Pain Assessment Pain Scale: 0-10 Pain Score: 0-No pain Home Living/Prior Functioning Home Living Family/patient expects to be discharged to:: Private residence Living Arrangements: Spouse/significant other Available Help at Discharge: Family, Available PRN/intermittently Type of Home: House Home Access: Stairs to enter Technical brewer of Steps: 5 Entrance Stairs-Rails: Right, Left, Can reach both Home Layout: One level Bathroom Shower/Tub: Gaffer, Chiropodist: Standard Bathroom Accessibility: Yes Additional Comments: 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 but prior both were indep with A/IADL's  Lives With: Spouse IADL History Homemaking Responsibilities: Yes Meal Prep Responsibility: Primary Laundry Responsibility: Primary Cleaning Responsibility:  Primary Bill Paying/Finance Responsibility: Primary Shopping Responsibility: Primary Homemaking Comments: 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 Current License: Yes Occupation: Other (comment) (homemaker) Leisure and Hobbies: grdening especially flowers, spedning time with her grand children and dog, church Prior Function Level of Independence: Independent with basic ADLs, Independent with homemaking with ambulation, Independent with gait, Independent with transfers  Able to Take Stairs?: Yes Driving: Yes Vocation: Retired Leisure: Hobbies-yes (Comment) (gardening) Vision Baseline Vision/History: 1 Wears glasses (readers) Ability to See in Adequate Light: 0 Adequate Patient Visual Report: No change from baseline Vision Assessment?: No apparent visual deficits Perception  Perception: Within Functional Limits Praxis Praxis: Impaired Praxis Impairment Details: Motor planning Cognition Cognition Overall Cognitive Status: Impaired/Different from baseline Arousal/Alertness: Awake/alert Orientation Level: Person;Place;Situation Person: Oriented Place: Oriented Situation: Oriented Memory: Impaired Memory Impairment: Decreased short term memory Decreased Short Term Memory: Functional complex Attention: Sustained;Focused;Selective Focused Attention: Appears intact Focused Attention Impairment: Functional complex Sustained Attention: Appears intact Selective Attention: Impaired Selective Attention Impairment: Verbal complex;Functional complex Awareness: Impaired Awareness Impairment: Anticipatory impairment;Intellectual impairment Problem Solving: Impaired Problem Solving Impairment: Functional complex Executive Function: Decision Making Decision Making: Impaired Decision Making Impairment: Functional complex Behaviors: Lability;Other (comment) (tearful, needs encouragement, has perspective of being somewhat of a burden) Safety/Judgment: Impaired (mild) Comments:  reported she just wants to go home and needs reinforcement that she will benefit greatly from program Brief Interview for Mental Status (BIMS) Repetition of Three Words (First Attempt): 3 Temporal Orientation: Year: Correct Temporal Orientation: Month: Accurate within 5 days Temporal Orientation: Day: Correct Recall: "Sock": Yes, after cueing ("something to wear") Recall: "Blue": Yes, after cueing ("a color") Recall: "Bed": Yes, after cueing ("a piece of furniture") BIMS Summary Score: 12 Sensation Sensation Light Touch: Appears Intact Hot/Cold: Appears Intact Proprioception: Appears Intact Stereognosis: Not tested Additional Comments: able to manipulate a retrieve items from travel kit Coordination Gross Motor Movements are Fluid and Coordinated: No Fine Motor Movements are Fluid and Coordinated: No Coordination and Movement Description: decreased graded control Finger Nose Finger Test: impaired R sided 9 Hole Peg Test: TBA Motor  Motor Motor: Hemiplegia Motor - Skilled Clinical Observations: R sided weakness with mild deficits but increased time and processing for motor output, slowed fingers to thumb R > L  Trunk/Postural Assessment  Cervical Assessment Cervical Assessment: Within Functional Limits Thoracic Assessment Thoracic Assessment: Within Functional Limits Lumbar Assessment Lumbar Assessment: Within Functional Limits Postural Control Postural Control: Within Functional Limits Trunk Control: mild proximal weakness Righting Reactions: unable to reach OBOS during standing level LB garment management  R>L deficits  Balance Balance Balance Assessed: Yes Static Sitting Balance Static Sitting - Balance Support: No upper extremity supported;Feet supported Static Sitting - Level of Assistance: 5: Stand by assistance Dynamic Sitting Balance Dynamic Sitting - Balance Support: Feet supported;No upper extremity supported Dynamic Sitting - Level of Assistance: 4: Min  assist Dynamic Sitting - Balance Activities: Reaching across midline;Reaching for objects Sitting balance - Comments: crossed leg for sock donning with increased difficulty with R LE Static Standing Balance Static Standing - Balance Support: Bilateral upper extremity supported Static Standing - Level of Assistance: 4: Min assist Static Standing - Comment/# of Minutes: >1 min for LB garment management Dynamic Standing Balance Dynamic Standing - Balance Support: Bilateral upper extremity supported;During functional activity Dynamic Standing - Level of Assistance: 4: Min assist Dynamic Standing - Comments: LB self care wiht supported standing Extremity/Trunk Assessment RUE Assessment RUE Assessment: Exceptions to [facility] General Strength Comments: mild R UE gross weakness RUE Body System: Neuro RUE Strength RUE Overall Strength: Deficits RUE Overall Strength Comments: 3+/5 LUE Assessment LUE Assessment: Within Functional Limits  Care Tool Care Tool Self Care Eating   Eating Assist Level: Set up assist    Oral Care    Oral Care Assist Level: Supervision/Verbal cueing    Bathing   Body parts bathed by patient: Right arm;Left arm;Chest;Abdomen;Front perineal area;Right upper leg;Left upper leg;Face Body parts bathed by helper: Right lower leg;Left lower leg   Assist Level: Moderate Assistance - Patient 50 - 74%    Upper Body Dressing(including orthotics)   What is the patient wearing?: Pull over shirt   Assist Level: Minimal Assistance - Patient > 75%    Lower Body Dressing (excluding footwear)   What is the patient wearing?: Underwear/pull up;Pants Assist for lower body dressing: Moderate Assistance - Patient 50 - 74%    Putting on/Taking off footwear   What is the patient wearing?: Socks;Shoes Assist for footwear: Moderate Assistance - Patient 50 - 74%       Care Tool Toileting Toileting activity   Assist for toileting: Moderate Assistance - Patient 50 - 74%     Care  Tool Bed Mobility Roll left and right activity   Roll left and right assist level: Supervision/Verbal cueing    Sit to lying activity   Sit to lying assist level: Contact Guard/Touching assist    Lying to sitting on side of bed activity   Lying to sitting on side of bed assist level: the ability to move from lying on the back to sitting on the side of the bed with no back support.: Contact Guard/Touching assist     Care Tool Transfers Sit to stand transfer   Sit to stand assist level: Minimal Assistance - Patient > 75%    Chair/bed transfer   Chair/bed transfer assist level: Minimal Assistance - Patient > 75%     Toilet transfer   Assist Level: Moderate Assistance - Patient 50 - 74%     Care Tool Cognition  Expression of Ideas and Wants Expression of Ideas and Wants: 3. Some difficulty - exhibits some difficulty with expressing needs and ideas (e.g, some words or finishing thoughts) or speech is not clear  Understanding Verbal and Non-Verbal Content Understanding Verbal and Non-Verbal Content: 3. Usually understands - understands most conversations, but misses some part/intent of message. Requires cues at times to understand   Memory/Recall Ability Memory/Recall Ability : Location of own room;Current season;That he or she is in a hospital/hospital unit   Refer to Care  Plan for Long Term Goals  SHORT TERM GOAL WEEK 1 OT Short Term Goal 1 (Week 1): Pt will perform sit to stand in preparation for ADL's and mobility with close S ad min cues OT Short Term Goal 2 (Week 1): Pt will complete UB bathing and dressing with set up and min cues OT Short Term Goal 3 (Week 1): Pt will complete LB bathing and dressing with close S after set up and min cues with intermittent standing with unilateral UE support OT Short Term Goal 4 (Week 1): Pt will perform toilet and shower bench transfers with close s and min cues using grab bar for support OT Short Term Goal 5 (Week 1): Pt will stand up to 5  minutes for simple item retrieval at kitchen counter with close S  Recommendations for other services: Therapeutic Recreation  Pet therapy, Stress management, and Outing/community reintegration   Skilled Therapeutic Intervention ADL ADL Eating: Set up Where Assessed-Eating: Edge of bed Grooming: Contact guard Where Assessed-Grooming: Sitting at sink;Standing at sink Upper Body Bathing: Minimal assistance Where Assessed-Upper Body Bathing: Shower Lower Body Bathing: Moderate assistance Where Assessed-Lower Body Bathing: Shower Upper Body Dressing: Minimal assistance Where Assessed-Upper Body Dressing: Edge of bed Lower Body Dressing: Moderate assistance Where Assessed-Lower Body Dressing: Edge of bed Toileting: Moderate assistance Where Assessed-Toileting: Bedside Commode Toilet Transfer: Minimal assistance Toilet Transfer Method: Stand pivot Toilet Transfer Equipment: Grab bars;Bedside commode Tub/Shower Transfer: Unable to assess Social research officer, government: Moderate assistance Social research officer, government Method: Brewing technologist ADL Comments: pt requires increased time and effort with mild R UE/LE weakness thus benefits from hemi technique support and cues, fatigues easily, performed full shower and ADL routine with support of OT Mobility  Bed Mobility Bed Mobility: Rolling Right;Rolling Left;Left Sidelying to Sit;Sit to Sidelying Left Rolling Right: Supervision/verbal cueing Rolling Left: Supervision/Verbal cueing Left Sidelying to Sit: Contact Guard/Touching assist Sit to Sidelying Left: Contact Guard/Touching assist  Pt seen for full initial OT evaluation and training session this am. Pt on 3 in 1 commode next to bed upon OT arrival. OT introduced role of therapy and purpose of session. Pt somewhat tearful and reported with short phrases "I want to go home". Pt voided and was ready to come off. OT provided set up to complete peri hygiene and  required min A using R UE. OT assisted with transfer commode to w/c then to tub transfer bench with bar with min A overall. Pt completed full shower including hair washing and UB bathing with min A and LB bathing with mod A with brief interval of standing. All requiring increased time and effort, as well as cues for sequencing and safety. OT assisted with transfer back to w/c for grooming and dressing routine. Pt able to express simple words and phrases with encouragement but increased processing and use of gestures with minimal frustration. Pt required set up and min A for oral and hair care with use of R UE as relatively dominant assist but with decreased coordination and motor accuracy. Pt with some motor apraxia, anomia and decreased graded control. Stood up to 1 minute intervals x 3 trials during grooming and dressing with pull over shirt, underwear, jeans, socks and Velcro tennis shoes (see IE for levels of each task). Cues and assist for hemi techniques for R deficits. Short distance amb with RW set for height with initial mod A fading to min A 10 ft from w/c to bed as pt requested to rest back  in bed due to fatigue. Dtr arrived and OT care coordination completed for initiating OT treatment. Pt left at end of session with bed alarm set, tray table and nurse call bell within reach.    Discharge Criteria: Patient will be discharged from OT if patient refuses treatment 3 consecutive times without medical reason, if treatment goals not met, if there is a change in medical status, if patient makes no progress towards goals or if patient is discharged from hospital.  The above assessment, treatment plan, treatment alternatives and goals were discussed and mutually agreed upon: by patient and by family  Barnabas Lister 03/08/2022, 3:56 PM

## 2022-03-08 NOTE — Evaluation (Signed)
Speech Language Pathology Assessment and Plan  Patient Details  Name: JAISHA VILLACRES MRN: [id number] Date of Birth: 1941-09-08  SLP Diagnosis: Speech and Language deficits;Cognitive Impairments;Dysarthria  Rehab Potential: Good ELOS: 7-10 days    Today's Date: 03/08/2022 SLP Individual Time: 1200-1300 SLP Individual Time Calculation (min): 60 min   Hospital Problem: Principal Problem:   Basal ganglia stroke [facility])  Past Medical History:  Past Medical History:  Diagnosis Date   Essential hypertension    Hyperlipidemia    Past Surgical History: History reviewed. No pertinent surgical history.  Assessment / Plan / Recommendation Clinical Impression  Pt is an 81 year old female with medical hx significant for: CKD 3B, essential HTN, generalized anxiety. Pt presented to [facility] on 03/02/22 d/t right-sided facial droop, right arm weakness, right leg weakness, and some aphasia. Code stroke activated in ED. Pt not a candidate for tPA. CT head negative for acute intracranial abnormality. MRI brain showed acute ischemic stroke in left basal ganglia. MRA head negative for LVO; showed mild atherosclerotic changes at both bifurcations. 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 RV function was normal with only trivial MR and no significant valve abnormalities. Therapy evaluations completed and CIR recommended d/t pt's deficits in functional mobility and ability to complete ADLs independently.    Pt presents with mild-moderate expressive aphasia, further impacted by reduce vocal intensity and misarticulation. Pt is able to name objects, verbs in pictures and read at phrase level. Pt is able to follow 1-2 step commands and answer complex yes/no questions. Witting was not assessed at this time due to limited time with need to use the bathroom and consume lunch tray. Pt's language is further impacted by cognitive deficits especially in memory. Pt scored 15/30 (n=>27) on  SLUMS, indicating deficits in selective attention, memory, problem solving and intellectual/emergent awareness. Pt's oral motor exam indicate right side facial and lingual weakness/reduced ROM. Pt consumed minimal amounts of trials due to fatigue but swallow function appeared [facility] on regular textures and thin liquids. Pt and pt's daughter report swallow function impairments has resolved and desire to focus on speech, language and cognitive deficits. Pt would benefit from skilled ST services in order to maximize functional independence and reduce burden of care, likely requiring supervision at discharge with continued skilled ST services.    Skilled Therapeutic Interventions          Skilled ST services focused on cognitive linguistic skills. SLP facilitated administration of cognitive linguistic formal assessment and provided education of results. SLP and pt collaborated to set goals for cognitive linguistic needs during length of stay. All questions answered to satisfaction.  Pt was left in room with call bell within reach and bed alarm set. SLP recommends to continue skilled services.    SLP Assessment  Patient will need skilled Speech Lanaguage Pathology Services during CIR admission    Recommendations  SLP Diet Recommendations: Age appropriate regular solids;Thin Liquid Administration via: Straw;Cup Medication Administration: Whole meds with liquid Supervision: Patient able to self feed Compensations: Minimize environmental distractions Postural Changes and/or Swallow Maneuvers: Seated upright 90 degrees Oral Care Recommendations: Oral care BID Patient destination: Home Follow up Recommendations: Home Health SLP;24 hour supervision/assistance;Outpatient SLP Equipment Recommended: None recommended by SLP    SLP Frequency 3 to 5 out of 7 days   SLP Duration  SLP Intensity  SLP Treatment/Interventions 7-10 days  Minumum of 1-2 x/day, 30 to 90 minutes  Cognitive  remediation/compensation;Cueing hierarchy;Dysphagia/aspiration precaution training;Functional tasks;Internal/external aids;Speech/Language  facilitation;Patient/family education    Pain Pain Assessment Pain Score: 0-No pain  Prior Functioning Cognitive/Linguistic Baseline: Baseline deficits Baseline deficit details: memory Type of Home: House  Lives With: Spouse Available Help at Discharge: Family;Available PRN/intermittently Vocation: Retired  SLP Evaluation Cognition Overall Cognitive Status: Impaired/Different from baseline Arousal/Alertness: Awake/alert Orientation Level: Oriented X4 Attention: Sustained;Selective Focused Attention: Appears intact Sustained Attention: Appears intact Selective Attention: Impaired Selective Attention Impairment: Verbal complex;Functional complex Memory: Impaired Memory Impairment: Decreased short term memory Decreased Short Term Memory: Functional complex Awareness: Impaired Awareness Impairment: Intellectual impairment;Emergent impairment Problem Solving: Impaired Problem Solving Impairment: Functional complex;Verbal complex Safety/Judgment: Impaired (mild)  Comprehension Auditory Comprehension Overall Auditory Comprehension: Impaired Yes/No Questions: Within Functional Limits Commands: Impaired One Step Basic Commands: 75-100% accurate Two Step Basic Commands: 75-100% accurate Multistep Basic Commands: 25-49% accurate Complex Commands: 25-49% accurate Conversation: Simple Visual Recognition/Discrimination Discrimination: Not tested Reading Comprehension Reading Status: Impaired Word level: Within functional limits Expression Expression Primary Mode of Expression: Verbal Verbal Expression Overall Verbal Expression: Impaired Initiation: Impaired Level of Generative/Spontaneous Verbalization: Sentence Repetition: No impairment Naming: Impairment Confrontation: Within functional limits Divergent: 0-24% accurate Verbal Errors:  Phonemic paraphasias;Semantic paraphasias Written Expression Dominant Hand: Right Written Expression: Not tested Oral Motor Oral Motor/Sensory Function Overall Oral Motor/Sensory Function: Mild impairment Facial Strength: Reduced right Lingual Strength: Reduced Motor Speech Overall Motor Speech: Impaired Phonation: Low vocal intensity Resonance: Within functional limits Articulation: Impaired Level of Impairment: Phrase Intelligibility: Intelligibility reduced Word: 75-100% accurate Phrase: 75-100% accurate Sentence: 50-74% accurate Motor Planning: Witnin functional limits Motor Speech Errors: Not applicable  Care Tool Care Tool Cognition Ability to hear (with hearing aid or hearing appliances if normally used Ability to hear (with hearing aid or hearing appliances if normally used): 2. Moderate difficulty - speaker has to increase volume and speak distinctly   Expression of Ideas and Wants Expression of Ideas and Wants: 3. Some difficulty - exhibits some difficulty with expressing needs and ideas (e.g, some words or finishing thoughts) or speech is not clear   Understanding Verbal and Non-Verbal Content Understanding Verbal and Non-Verbal Content: 3. Usually understands - understands most conversations, but misses some part/intent of message. Requires cues at times to understand  Memory/Recall Ability Memory/Recall Ability : Location of own room;Current season;That he or she is in a hospital/hospital unit   PMSV Assessment  PMSV Trial Intelligibility: Intelligibility reduced Word: 75-100% accurate Phrase: 75-100% accurate Sentence: 50-74% accurate  Bedside Swallowing Assessment General Diet Prior to this Study: Regular;Thin liquids Behavior/Cognition: Alert;Cooperative Oral Cavity - Dentition: Dentures, top Self-Feeding Abilities: Able to feed self Vision: Functional for self-feeding Patient Positioning: Upright in chair/Tumbleform Baseline Vocal Quality: Low vocal  intensity Volitional Cough: Weak Volitional Swallow: Able to elicit  Oral Care Assessment Does patient have any of the following "high(er) risk" factors?: None of the above Does patient have any of the following "at risk" factors?: None of the above Patient is LOW RISK: Follow universal precautions (see row information) Ice Chips Ice chips: Not tested Thin Liquid Thin Liquid: Within functional limits Nectar Thick Nectar Thick Liquid: Not tested Honey Thick   Puree Puree: Not tested Solid Solid: Within functional limits Presentation: Self Fed BSE Assessment Risk for Aspiration Impact on safety and function: No limitations  Short Term Goals: Week 1: SLP Short Term Goal 1 (Week 1): Pt will demonstrate speech intelligibility at phrase/simple sentence level with 70% accuracy given supervision A over articulation and increase vocal intensity cues. SLP Short Term Goal 2 (Week 1): Pt will express wants/needs at  phrase/simple sentence level with supervision A for word finding strategies. SLP Short Term Goal 3 (Week 1): Pt will demonstrate basic problem solving skills with supervision A verbal cues. SLP Short Term Goal 4 (Week 1): Pt will demonstrate recall of daily, novel information with supervision A for external aids. SLP Short Term Goal 5 (Week 1): Pt will self-monitor and self-correct verbal and functional errors with supervision A verbal cues. SLP Short Term Goal 6 (Week 1): Pt will participate in continued languaged assessment.  Refer to Care Plan for Long Term Goals  Recommendations for other services: None   Discharge Criteria: Patient will be discharged from SLP if patient refuses treatment 3 consecutive times without medical reason, if treatment goals not met, if there is a change in medical status, if patient makes no progress towards goals or if patient is discharged from hospital.  The above assessment, treatment plan, treatment alternatives and goals were discussed and  mutually agreed upon: by patient  Raye Wiens  [facility] 03/08/2022, 4:58 PM

## 2022-03-08 NOTE — Progress Notes (Signed)
Inpatient Rehabilitation  Patient information reviewed and entered into eRehab system by Emerson Schreifels Marc Sivertsen, OTR/L.   Information including medical coding, functional ability and quality indicators will be reviewed and updated through discharge.    

## 2022-03-09 DIAGNOSIS — I6381 Other cerebral infarction due to occlusion or stenosis of small artery: Secondary | ICD-10-CM | POA: Diagnosis not present

## 2022-03-09 NOTE — Progress Notes (Signed)
PROGRESS NOTE   Subjective/Complaints:  No issues overnite , affect brighter today, working with PT   ROS- neg CP, SOB, N/V/D Objective:   No results found. Recent Labs    03/07/22 0535 03/08/22 0551  WBC 7.4 8.4  HGB 12.5 12.6  HCT 36.0 36.3  PLT 245 268    Recent Labs    03/07/22 0535 03/08/22 0551  NA 136 138  K 3.5 3.8  CL 102 101  CO2 23 26  GLUCOSE 104* 109*  BUN 18 26*  CREATININE 1.19* 1.34*  CALCIUM 9.2 9.3     Intake/Output Summary (Last 24 hours) at 03/09/2022 0813 Last data filed at 03/08/2022 2223 Gross per 24 hour  Intake 481 ml  Output --  Net 481 ml         Physical Exam: Vital Signs Blood pressure (!) 147/62, pulse 62, temperature 97.6 F (36.4 C), resp. rate 18, weight 59.2 kg, SpO2 100 %.   General: No acute distress Mood and affect are appropriate Heart: Regular rate and rhythm no rubs murmurs or extra sounds Lungs: Clear to auscultation, breathing unlabored, no rales or wheezes Abdomen: Positive bowel sounds, soft nontender to palpation, nondistended Extremities: No clubbing, cyanosis, or edema Skin: No evidence of breakdown, no evidence of rash Neurologic: Cranial nerves II through XII intact, motor strength is 5/5 in bilateral deltoid, bicep, tricep, grip, hip flexor, knee extensors, ankle dorsiflexor and plantar flexor Sensory exam normal sensation to light touch and proprioception in bilateral upper and lower extremities Cerebellar exam normal finger to nose to finger as well as heel to shin in bilateral upper and lower extremities Musculoskeletal: Full range of motion in all 4 extremities. No joint swelling   Assessment/Plan: 1. Functional deficits which require 3+ hours per day of interdisciplinary therapy in a comprehensive inpatient rehab setting. Physiatrist is providing close team supervision and 24 hour management of active medical problems listed  below. Physiatrist and rehab team continue to assess barriers to discharge/monitor patient progress toward functional and medical goals  Care Tool:  Bathing    Body parts bathed by patient: Right arm, Left arm, Chest, Abdomen, Front perineal area, Right upper leg, Left upper leg, Face   Body parts bathed by helper: Right lower leg, Left lower leg     Bathing assist Assist Level: Moderate Assistance - Patient 50 - 74%     Upper Body Dressing/Undressing Upper body dressing   What is the patient wearing?: Pull over shirt    Upper body assist Assist Level: Minimal Assistance - Patient > 75%    Lower Body Dressing/Undressing Lower body dressing      What is the patient wearing?: Underwear/pull up, Pants     Lower body assist Assist for lower body dressing: Moderate Assistance - Patient 50 - 74%     Toileting Toileting    Toileting assist Assist for toileting: Moderate Assistance - Patient 50 - 74%     Transfers Chair/bed transfer  Transfers assist     Chair/bed transfer assist level: Minimal Assistance - Patient > 75%     Locomotion Ambulation   Ambulation assist      Assist level: Minimal Assistance - Patient >  75% Assistive device: Walker-rolling Max distance: 63ft   Walk 10 feet activity   Assist     Assist level: Minimal Assistance - Patient > 75% Assistive device: Walker-rolling   Walk 50 feet activity   Assist Walk 50 feet with 2 turns activity did not occur: Safety/medical concerns (fatigue, orthostatic)         Walk 150 feet activity   Assist Walk 150 feet activity did not occur: Safety/medical concerns         Walk 10 feet on uneven surface  activity   Assist Walk 10 feet on uneven surfaces activity did not occur: Safety/medical concerns         Wheelchair     Assist Is the patient using a wheelchair?: No             Wheelchair 50 feet with 2 turns activity    Assist            Wheelchair 150  feet activity     Assist          Blood pressure (!) 147/62, pulse 62, temperature 97.6 F (36.4 C), resp. rate 18, weight 59.2 kg, SpO2 100 %.  Medical Problem List and Plan: 1. Functional deficits secondary to large left basal ganglia infarct             -patient may not shower due to loop recorder placement, x 25more days              -ELOS/Goals: 7-10 days S             -Admit to CIR 2.  Antithrombotics: -DVT/anticoagulation:  Pharmaceutical: Lovenox             -antiplatelet therapy: DAPT X 3 weeks followed by Plavix alone 3. Chronic low back pain: Tylenol prn for chronic LBP. Add kpad. 4. Mood: LCSW to follow for evaluation and support.              -antipsychotic agents: N/A 5. Neuropsych: This patient maybe capable of making decisions on her own behalf. 6. Skin/Wound Care: Routine pressure relief measures.  7. Fluids/Electrolytes/Nutrition: Monitor I/O. Check CMET in am. 8. HTN: Monitor BP TID--continue Torsemide, Losartan and Coreg.  Vitals:   03/08/22 1934 03/09/22 0531  BP: (!) 124/47 (!) 147/62  Pulse: 65 62  Resp: 18 18  Temp: 97.7 F (36.5 C) 97.6 F (36.4 C)  SpO2: 99% 100%  Controlled 6/9 9.  HFrEF/New CM: No signs of overload.  Continue Low salt diet. Strict I/O w/daily wts             --monitor for signs of overload.   Ej fx 30-35%             --on Torsemide, Losartan, Coreg, ASA and atorvastatin.             --plans for GDMT in the future.  10. Hypothyroid: On levothyroxine] 11. H/o depression: Continue Sertraline and Mirtazapine- emotional lability improved vs yesterday              --ativan bid prn as at home. 12. CKD:  BUN/SCr- 28/1.14 @ admission-->improved with IVF--18/1.19 --Avoid nephrotoxic  medications 13. Pre-diabetes: Hgb A1C-6.0. Dietary education.     LOS: 2 days A FACE TO FACE EVALUATION WAS PERFORMED  Charlett Blake 03/09/2022, 8:13 AM

## 2022-03-09 NOTE — Progress Notes (Signed)
Speech Language Pathology Daily Session Note  Patient Details  Name: Tamara Bullock MRN: [id number] Date of Birth: [birth year]  Today's Date: 03/09/2022 SLP Individual Time: 1445-1530 SLP Individual Time Calculation (min): 45 min  Short Term Goals: Week 1: SLP Short Term Goal 1 (Week 1): Pt will demonstrate speech intelligibility at phrase/simple sentence level with 70% accuracy given supervision A over articulation and increase vocal intensity cues. SLP Short Term Goal 2 (Week 1): Pt will express wants/needs at phrase/simple sentence level with supervision A for word finding strategies. SLP Short Term Goal 3 (Week 1): Pt will demonstrate basic problem solving skills with supervision A verbal cues. SLP Short Term Goal 4 (Week 1): Pt will demonstrate recall of daily, novel information with supervision A for external aids. SLP Short Term Goal 5 (Week 1): Pt will self-monitor and self-correct verbal and functional errors with supervision A verbal cues. SLP Short Term Goal 6 (Week 1): Pt will participate in continued languaged assessment.  Skilled Therapeutic Interventions: Pt seen for skilled ST with focus on cognitive and speech goals, pt sitting EOB and agreeable to therapeutic tasks. SLP providing re-education on speech intelligibility strategies, specifically over articulation, breath support and pacing which patient was able to carryover and utilize with overall min A verbal cues for 70% intelligibility. Pt demonstrates error awareness/communication breakdown on ~60% of occasions. Pt completing informal assessment of writing function by writing printed name, signature, DOB and address. Legibility ~50%, pt reports a significant decline in writing at this time and would like to continue to practice. SLP facilitating simple money counting activity with pt completing with 40% accuracy provided min A cues. Pt required frequent repetition of prompts to recall target $ amount. Pt left EOB with bed alarm  set and all needs within reach. Cont ST POC.  Pain Pain Assessment Pain Scale: 0-10 Pain Score: 0-No pain  Therapy/Group: Individual Therapy  Tacey Ruiz 03/09/2022, 3:34 PM

## 2022-03-09 NOTE — Progress Notes (Addendum)
Occupational Therapy Session Note  Patient Details  Name: Tamara Bullock MRN: [id number] Date of Birth: [birth year]  Today's Date: 03/09/2022 OT Individual Time: [id number] session 1 OT Individual Time Calculation (min): 64 min  Session 2: [id number]   Short Term Goals: Week 1:  OT Short Term Goal 1 (Week 1): Pt will perform sit to stand in preparation for ADL's and mobility with close S ad min cues OT Short Term Goal 2 (Week 1): Pt will complete UB bathing and dressing with set up and min cues OT Short Term Goal 3 (Week 1): Pt will complete LB bathing and dressing with close S after set up and min cues with intermittent standing with unilateral UE support OT Short Term Goal 4 (Week 1): Pt will perform toilet and shower bench transfers with close s and min cues using grab bar for support OT Short Term Goal 5 (Week 1): Pt will stand up to 5 minutes for simple item retrieval at kitchen counter with close S  Skilled Therapeutic Interventions/Progress Updates:  Session1: Pt greeted supine in bed noted to be teary eyed reporting that she was worried that her kids hadn't arrived yet. Oriented pt to time with it still being early in the day, provided emotional support and utlized therapeutic use of self to comfort pt. Pt then agreeable to OT intervention. Pt requesting to change LB clothes. Pt needed overall CGA for LB dressing via sit>stand.  Pt completed ambulatory toilet transfer with Rw and CGA, CGA for 3/3 toileting tasks. Pt able to stand at sink for hand hygiene with supervision.  Pt transported to day room in w/c with total A for time mgmt. Pt completed 9 HPT with results as indicated below: RUE: 2 mins and 29 secs  LUE: 1 min and 3 secs  Pts sensation appears to be intact in RUE after gross assessment. Pt completed seated [place] task using push pins to outline shapes on cork board to facilitate improved RUE [place] with + time but overall MIN cues as noted to not fully push pins all the way in  initially.  Attempted to work on functional ambulation however pt reprots dizziness upon standing with BP assessed below: Bp sitting 112/66 (79) hr 8  Transported pt back to room with total A, pt completed ambulatory toilet transfer with RW and CGA, CGA for 3/3 toileting tasks. Pt left seated in w/c with alarm activated and all needs within reach.                        [place]: Session focus on BADL reeducation, functional mobility, dynamic standing balance and decreasing overall caregiver burden.  Pt reports needs to void bladder, CGA for ambulatory toilet transfer with Rw. Pt completed 3/3 toileting tasks with CGA. Pt exited bathroom in similar manner. Pt stood at sink for hand hygiene with supervision. Pt transported to gym for time mgmt.  In gym, worked on functional ambulation where pt instructed to ambulate around gym gathering items with RUE to facilitate improved dynamic balance, R sided attention, and RUE motor planning.pt completed task with overall CGA with Rw, noted some mild R inattention as pt running into obstacles with RW on R side. Pt needed + time to pick up items around gym d/t impaired RUE [place]. Pt able to ambulate back to room > 50 ft with Rw and CGA with chair follow. BP assses after session as indicated below:  BP in sitting after session 134/45 ( 70) HR 59 pt left seated EOM with alarm set and all needs within reach.       Therapy Documentation Precautions:  Precautions Precautions: Fall Precaution Comments: Mild expressive aphasia Restrictions Weight Bearing Restrictions: No  Pain: Session 1: no pain reported during session  Session 2: no pain reported during session     Therapy/Group: Individual Therapy  Precious Haws 03/09/2022, 12:27 PM

## 2022-03-09 NOTE — Progress Notes (Signed)
Physical Therapy Session Note  Patient Details  Name: Tamara Bullock MRN: [id number] Date of Birth: 27-Jul-1941  Today's Date: 03/09/2022 PT Individual Time: 0800-0855 PT Individual Time Calculation (min): 55 min   Short Term Goals: Week 1:  PT Short Term Goal 1 (Week 1): STG = LTG due to ELOS  Skilled Therapeutic Interventions/Progress Updates:     Pt in bed to start with RN at bedside administering morning Rx. Pt agreeable to PT tx and denies pain, appears anxious. Retrieved thigh-high TED's due to yesterday's orthostatic vitals. Bed mobility completed with supervision. Donned socks/shoes with totalA for time management. Stand<>pivot transfer with CGA and no AD from EOB to w/c. Pt requesting to be brought sinkside to complete a few self care tasks (washing face, brushing teeth, combing hair).  Tasks completed at w/c level for energy conservation. Pt able to complete these tasks with setupA, no significant motor planning deficits or perseveration. MD entering for morning rounding as well while she completed these. Transported to main rehab gym for time management and energy conservation. BP assessed in sitting and standing:  BP sitting: 135/52 HR 67 BP 113/52 HR 78 *Pt symptomatic  Encouraged hydration daily for BP support. Pt reports she hasn't been drinking a lot of water due to fear of "bothering" the nursing staff..  Instructed in interval gait training in rehab gym to challenge endurance, balance, activity tolerance, and functional mobility. Gait ~85f + ~672f+ ~6542fith CGA and RW. Gait speed increased from yesterday with improved stride length as well. Gait distance limited by fatigue and lightheadedness. Requires seated rest breaks b/w bouts.   Repeated step ups 2x6 to 6inch step using 2 hand rails and CGA for safety. Again, limited by fatigue/endurance and some lightheadedness.   Returned to her room in w/c. Remained seated in w/c with chair alarm on, call bell in reach, all needs  met. Reminded of "call don't fall" policy.   Therapy Documentation Precautions:  Precautions Precautions: Fall Precaution Comments: Mild expressive aphasia Restrictions Weight Bearing Restrictions: No General:    Therapy/Group: Individual Therapy  Jasenia Weilbacher P Keilana Morlock PT 03/09/2022, 7:37 AM

## 2022-03-09 NOTE — IPOC Note (Signed)
Overall Plan of Care [facility]) Patient Details Name: Tamara Bullock MRN: [id number] DOB: [birth year]  Admitting Diagnosis: Basal ganglia stroke [facility])  Hospital Problems: Principal Problem:   Basal ganglia stroke ([place])     Functional Problem List: Nursing Safety, Endurance, Medication Management  PT Balance, Endurance, Safety  OT Balance, Motor, Cognition, Safety, Endurance, Other (Comment) (emotional lability and reports of depressive symptoms)  SLP Cognition  TR         Basic ADL's: OT Grooming, Bathing, Dressing, Toileting     Advanced  ADL's: OT Simple Meal Preparation, Laundry, Light Housekeeping     Transfers: PT Bed Mobility, Bed to Chair, Teacher, early years/pre, Tub/Shower     Locomotion: PT Ambulation, Stairs     Additional Impairments: OT Fuctional Use of Upper Extremity  SLP Communication, Social Cognition expression Problem Solving, Memory, Awareness  TR      Anticipated Outcomes Item Anticipated Outcome  Self Feeding mod I  Swallowing      Basic self-care  Supervision  Toileting  Supervision   Bathroom Transfers Supervision  Bowel/Bladder  n/a  Transfers  supervision  Locomotion  supervision  Communication  Supervision A sentence  Cognition  Supervision A basic  Pain  n/a  Safety/Judgment  maintain safety w cues   Therapy Plan: PT Intensity: Minimum of 1-2 x/day ,45 to 90 minutes PT Frequency: 5 out of 7 days PT Duration Estimated Length of Stay: 7-9 days OT Intensity: Minimum of 1-2 x/day, 45 to 90 minutes OT Frequency: 5 out of 7 days OT Duration/Estimated Length of Stay: 7-10 days SLP Intensity: Minumum of 1-2 x/day, 30 to 90 minutes SLP Frequency: 3 to 5 out of 7 days SLP Duration/Estimated Length of Stay: 7-10 days   Team Interventions: Nursing Interventions Disease Management/Prevention, Medication Management, Discharge Planning, Patient/Family Education  PT interventions Ambulation/gait training, Discharge planning, Functional  mobility training, Psychosocial support, Therapeutic Activities, Visual/perceptual remediation/compensation, Therapeutic Exercise, Skin care/wound management, Neuromuscular re-education, Training and development officer, Wheelchair propulsion/positioning, Disease management/prevention, Cognitive remediation/compensation, DME/adaptive equipment instruction, Splinting/orthotics, Pain management, UE/LE Strength taining/ROM, UE/LE Coordination activities, Stair training, Patient/family education, Academic librarian, Technical sales engineer stimulation  OT Interventions Training and development officer, Academic librarian, Disease mangement/prevention, Brewing technologist, Barrister's clerk education, Self Care/advanced ADL retraining, Therapeutic Exercise, UE/LE Coordination activities, Wheelchair propulsion/positioning, Cognitive remediation/compensation, Discharge planning, DME/adaptive equipment instruction, Functional mobility training, Psychosocial support, Therapeutic Activities, UE/LE Strength taining/ROM  SLP Interventions Cognitive remediation/compensation, Cueing hierarchy, Dysphagia/aspiration precaution training, Functional tasks, Internal/external aids, Speech/Language facilitation, Patient/family education  TR Interventions    SW/CM Interventions Discharge Planning, Psychosocial Support, Patient/Family Education, Disease Management/Prevention   Barriers to Discharge MD  Medical stability  Nursing Decreased caregiver support, Home environment access/layout 1 level 5 ste w bil rails; husband w recent elbow fracture, children to assist  PT Decreased caregiver support, Insurance for SNF coverage    OT Home environment access/layout 5 steps with rail to enter  SLP      SW       Team Discharge Planning: Destination: PT-Home ,OT- Home , SLP-Home Projected Follow-up: PT-Home health PT, 24 hour supervision/assistance, OT-  24 hour supervision/assistance, Home health OT, SLP-Home Health SLP,  24 hour supervision/assistance, Outpatient SLP Projected Equipment Needs: PT-To be determined, OT- 3 in 1 bedside comode, Tub/shower seat, SLP-None recommended by SLP Equipment Details: PT- , OT-pt reports having RW and w/c already in home Patient/family involved in discharge planning: PT- Patient,  OT-Patient, Family member/caregiver, SLP-Patient, Family member/caregiver  MD ELOS: 7-10d Medical Rehab Prognosis:  Good Assessment: The patient has been  admitted for CIR therapies with the diagnosis of Left Basal Ganglia infarct. The team will be addressing functional mobility, strength, stamina, balance, safety, adaptive techniques and equipment, self-care, bowel and bladder mgt, patient and caregiver education, emotional lability . Goals have been set at Supervision. Anticipated discharge destination is home .        See Team Conference Notes for weekly updates to the plan of care

## 2022-03-10 DIAGNOSIS — I6381 Other cerebral infarction due to occlusion or stenosis of small artery: Secondary | ICD-10-CM | POA: Diagnosis not present

## 2022-03-10 DIAGNOSIS — F411 Generalized anxiety disorder: Secondary | ICD-10-CM

## 2022-03-10 DIAGNOSIS — I10 Essential (primary) hypertension: Secondary | ICD-10-CM | POA: Diagnosis not present

## 2022-03-10 NOTE — Progress Notes (Signed)
PROGRESS NOTE   Subjective/Complaints:  Pleased with herself that she walked to bathroom with NT this morning. Still really homesick and missing husband. Became a little tearful. Daughter with her to comfort her. They may be able to bring husband to visit.   ROS: Patient denies fever, rash, sore throat, blurred vision, dizziness, nausea, vomiting, diarrhea, cough, shortness of breath or chest pain, joint or back/neck pain, headache, .    Objective:   No results found. Recent Labs    03/08/22 0551  WBC 8.4  HGB 12.6  HCT 36.3  PLT 268   Recent Labs    03/08/22 0551  NA 138  K 3.8  CL 101  CO2 26  GLUCOSE 109*  BUN 26*  CREATININE 1.34*  CALCIUM 9.3    Intake/Output Summary (Last 24 hours) at 03/10/2022 0945 Last data filed at 03/10/2022 0900 Gross per 24 hour  Intake 900 ml  Output 1 ml  Net 899 ml        Physical Exam: Vital Signs Blood pressure (!) 146/57, pulse 66, temperature 97.7 F (36.5 C), temperature source Oral, resp. rate 18, height 5\' 3"  (1.6 m), weight 59.2 kg, SpO2 100 %.   Constitutional: No distress . Vital signs reviewed. HEENT: NCAT, EOMI, oral membranes moist Neck: supple Cardiovascular: RRR without murmur. No JVD    Respiratory/Chest: CTA Bilaterally without wheezes or rales. Normal effort    GI/Abdomen: BS +, non-tender, non-distended Ext: no clubbing, cyanosis, or edema Psych: pleasant but anxious Skin: No evidence of breakdown, no evidence of rash Neurologic: Cranial nerves II through XII intact, motor strength is close to 5/5 in bilateral deltoid, bicep, tricep, grip, hip flexor, knee extensors, ankle dorsiflexor and plantar flexor. Mild right sided weakness.  Sensory exam normal sensation to light touch and proprioception in bilateral upper and lower extremities Cerebellar exam normal finger to nose to finger as well as heel to shin in bilateral upper and lower  extremities Musculoskeletal: Full range of motion in all 4 extremities. No joint swelling   Assessment/Plan: 1. Functional deficits which require 3+ hours per day of interdisciplinary therapy in a comprehensive inpatient rehab setting. Physiatrist is providing close team supervision and 24 hour management of active medical problems listed below. Physiatrist and rehab team continue to assess barriers to discharge/monitor patient progress toward functional and medical goals  Care Tool:  Bathing    Body parts bathed by patient: Right arm, Left arm, Chest, Abdomen, Front perineal area, Right upper leg, Left upper leg, Face   Body parts bathed by helper: Right lower leg, Left lower leg     Bathing assist Assist Level: Moderate Assistance - Patient 50 - 74%     Upper Body Dressing/Undressing Upper body dressing   What is the patient wearing?: Pull over shirt    Upper body assist Assist Level: Minimal Assistance - Patient > 75%    Lower Body Dressing/Undressing Lower body dressing      What is the patient wearing?: Underwear/pull up     Lower body assist Assist for lower body dressing: Contact Guard/Touching assist     Toileting Toileting    Toileting assist Assist for toileting: Contact Guard/Touching assist  Transfers Chair/bed transfer  Transfers assist     Chair/bed transfer assist level: Minimal Assistance - Patient > 75%     Locomotion Ambulation   Ambulation assist      Assist level: Minimal Assistance - Patient > 75% Assistive device: Walker-rolling Max distance: 19ft   Walk 10 feet activity   Assist     Assist level: Minimal Assistance - Patient > 75% Assistive device: Walker-rolling   Walk 50 feet activity   Assist Walk 50 feet with 2 turns activity did not occur: Safety/medical concerns (fatigue, orthostatic)         Walk 150 feet activity   Assist Walk 150 feet activity did not occur: Safety/medical concerns          Walk 10 feet on uneven surface  activity   Assist Walk 10 feet on uneven surfaces activity did not occur: Safety/medical concerns         Wheelchair     Assist Is the patient using a wheelchair?: No             Wheelchair 50 feet with 2 turns activity    Assist            Wheelchair 150 feet activity     Assist          Blood pressure (!) 146/57, pulse 66, temperature 97.7 F (36.5 C), temperature source Oral, resp. rate 18, height 5\' 3"  (1.6 m), weight 59.2 kg, SpO2 100 %.  Medical Problem List and Plan: 1. Functional deficits secondary to large left basal ganglia infarct             -patient may not shower due to loop recorder placement, x days              -ELOS/Goals: 7-10 days S            -Continue CIR therapies including PT, OT, and SLP. Ensured patient that this would not be a long stay  -Grounds pass ordered 2.  Antithrombotics: -DVT/anticoagulation:  Pharmaceutical: Lovenox             -antiplatelet therapy: DAPT X 3 weeks followed by Plavix alone 3. Chronic low back pain: Tylenol prn for chronic LBP. Add kpad. 4. Mood: LCSW to follow for evaluation and support.              -antipsychotic agents: N/A 5. Neuropsych: This patient maybe capable of making decisions on her own behalf. 6. Skin/Wound Care: Routine pressure relief measures.  7. Fluids/Electrolytes/Nutrition: Monitor I/O. Check CMET in am. 8. HTN: Monitor BP TID--continue Torsemide, Losartan and Coreg.  Vitals:   03/09/22 1938 03/10/22 0310  BP: (!) 131/45 (!) 146/57  Pulse: 61 66  Resp: 18 18  Temp: 97.9 F (36.6 C) 97.7 F (36.5 C)  SpO2: 99% 100%  Controlled 6/10 9.  HFrEF/New CM: No signs of overload.  Continue Low salt diet. Strict I/O w/daily wts             --monitor for signs of overload.   Ej fx 30-35%             --on Torsemide, Losartan, Coreg, ASA and atorvastatin.             --plans for GDMT in the future.  10. Hypothyroid: On levothyroxine] 11.  H/o depression: Continue Sertraline and Mirtazapine- emotional lability improved vs yesterday              --ativan bid prn as at home.  -  ego support by family/rehab team 12. CKD:  BUN/SCr- 28/1.14 @ admission-->improved with IVF--18/1.19 --Avoid nephrotoxic  medications 13. Pre-diabetes: Hgb A1C-6.0. Dietary education.     LOS: 3 days A FACE TO FACE EVALUATION WAS PERFORMED  Ranelle Oyster 03/10/2022, 9:45 AM

## 2022-03-10 NOTE — Progress Notes (Addendum)
Occupational Therapy Session Note  Patient Details  Name: Tamara Bullock MRN: [id number] Date of Birth: October 28, 1940  Today's Date: 03/10/2022 OT Individual Time: 7867-5449 OT Individual Time Calculation (min): 45 min    Short Term Goals: Week 1:  OT Short Term Goal 1 (Week 1): Pt will perform sit to stand in preparation for ADL's and mobility with close S ad min cues OT Short Term Goal 2 (Week 1): Pt will complete UB bathing and dressing with set up and min cues OT Short Term Goal 3 (Week 1): Pt will complete LB bathing and dressing with close S after set up and min cues with intermittent standing with unilateral UE support OT Short Term Goal 4 (Week 1): Pt will perform toilet and shower bench transfers with close s and min cues using grab bar for support OT Short Term Goal 5 (Week 1): Pt will stand up to 5 minutes for simple item retrieval at kitchen counter with close S  Skilled Therapeutic Interventions/Progress Updates:      Therapy Documentation Precautions:  Precautions Precautions: Fall Precaution Comments: Mild expressive aphasia Restrictions Weight Bearing Restrictions: No General: Upon OT arrival, pt semi recumbent in bed and daughter present. Pt agreeable to OT session. No pain reported during session. Pt engaged in functional mobility with RW,and without RW, shower transfer training, LB dressing, and IADL task including making her bed to improve independence during self care. Pt requires CGA with RW for functional mobility to bathroom ~20' and Min A for functional mobility without RW around bed and to retrieve items. Pt completes shower transfer with CGA and requires verbal cues for safe technique and to simulate stepping over threshold like shower pt has at home. Pt sits EOB to donn shoes with setup assist. Pt removes pillow cases and donns pillow cases with increased time and SBA seated EOB. Pt makes her bed while standing with increased time and seated rest breaks required  secondary to decreased endurance and activity tolerance. Pt completes within ~20 minutes. Pt reports minimal dizziness with initial transfer at start of session but resolves. Pt making progress towards stated OT goals and continues to benefit from OT services to maximize safety and independence during functional tasks. Pt ends session in recliner with all safety measures.   Walk-In Shower Transfer: Administrator, arts Method: Designer, industrial/product: Emergency planning/management officer   Therapy/Group: Individual Therapy  Carolin Sicks 03/10/2022, 12:23 PM

## 2022-03-10 NOTE — Progress Notes (Signed)
Speech Language Pathology Daily Session Note  Patient Details  Name: Tamara Bullock MRN: [id number] Date of Birth: 1941/06/12  Today's Date: 03/10/2022 SLP Individual Time: 1300-1358 SLP Individual Time Calculation (min): 58 min  Short Term Goals: Week 1: SLP Short Term Goal 1 (Week 1): Pt will demonstrate speech intelligibility at phrase/simple sentence level with 70% accuracy given supervision A over articulation and increase vocal intensity cues. SLP Short Term Goal 2 (Week 1): Pt will express wants/needs at phrase/simple sentence level with supervision A for word finding strategies. SLP Short Term Goal 3 (Week 1): Pt will demonstrate basic problem solving skills with supervision A verbal cues. SLP Short Term Goal 4 (Week 1): Pt will demonstrate recall of daily, novel information with supervision A for external aids. SLP Short Term Goal 5 (Week 1): Pt will self-monitor and self-correct verbal and functional errors with supervision A verbal cues. SLP Short Term Goal 6 (Week 1): Pt will participate in continued languaged assessment.  Skilled Therapeutic Interventions: Skilled treatment session focused on cognitive and communication goals. Upon arrival, patient was awake while upright in the wheelchair and was visibly crying. Patient reported she was tearful due to missing her husband and family, SLP provided emotional support. SLP offered to take patient to the dayroom/SLP office to help brighten her spirits. Patient was initially agreeable but then became anxious that she may miss her family if they come to visit. She was eventually agreeable to leaving her room for ~30 minutes. SLP facilitated session by providing education regarding speech intelligibility strategies. Patient utilized the strategies at the word level with Min verbal cues but required Mod-Max verbal cues for use at the phrase level. Patient completed structured naming tasks independently but increased word-finding deficits noted  during informal conversation. Patient was transferred back to her room after 30 minutes without family present. Patient agreeable to finishing session in her room. SLP facilitated session by providing Mod-Max A verbal cues for recall of biographical and functional information regarding her family. Patient appeared brighter at end of session. Patient requested to use the bathroom and Max multimodal cues for safety due to impulsivity. Patient left on commode with nursing present. Continue with current plan of care.      Pain No/Denies Pain   Therapy/Group: Individual Therapy  Tamara Bullock 03/10/2022, 3:07 PM

## 2022-03-10 NOTE — Progress Notes (Signed)
Physical Therapy Session Note  Patient Details  Name: Tamara Bullock MRN: [id number] Date of Birth: July 17, 1941  Today's Date: 03/10/2022 PT Individual Time: 0905-1005; 1115-1200 PT Individual Time Calculation (min): 60 min , 45  Short Term Goals: Week 1:  PT Short Term Goal 1 (Week 1): STG = LTG due to ELOS  Skilled Therapeutic Interventions/Progress Updates:  Pt sitting in recliner; dtr present.  Pt denied pain, but stated that she needed to use toilet.  Sit> stand with CGA to RW.  Gait training on congested room, RW, CGA.  Toilet transfer using wall bar and RW, CGA.  Pt managed clothing in standing with CGA.  Continent of urine, and peri care with set-up.    Hand washing and oral care in standing, with extra time.  No c/o dizziness; TEDs not donned yet.  PT donned thigh high TEDS with pt in sitting and standing.  Pt donned socks and shoes in sitting with supervision.  Stand pivot transfer throughout session without AD; CG/min assist.    neuromuscular re-education via demo and multimodal cues while seated on mat table: bil heel raises, reciprocal scooting forward/backward without use of UEs for pelvic activation.  Lateral scooting R><L without use of UE, with CG to L, min assist to R.  Pt demonstrated some motor apraxia issues with these movements.  Pt locked/unlocked wc brakes with hand over hand assistance> supervision during session.   Pt exhausted by end of session, and asked to go back to bed.  Pt doffed shoes in sitting.  Stand pivot as above.  Needs left at hand and bed alarm set.  Tx 2:  pt sitting on toilet iwht LPN in attendance.  LPN will ask charge nurse to order abdominal binder, as pt continues to feel light headed with activity.  Pt denied pain. Continent of urine. Sit> stand from wc with CGA.  Pt managed clothes with CGA for balance.  Peri care with supervision.  neuromuscular re-education via forced use, multimodal cues for alternating reciprocal movmenets using Kinetron  at resistance 50/cm , seated in wc.  Targeting quadriceps x 2 min, targeting gluteals x 1 min.  Sit> stand from low wc with CGA, extra time, to RW.  Pt unable to tolerate standing.  BP in sitting 123/52, HR 60.  On 2nd attempt, pt able to perform gait training with RW, x 75', with light headed feeling starting around 60'. BP in sitting 114/50, HR 63.  PT informed Erica, LPN of pt's hypotension.  At end of session, pt seated in wc, with seat pad alarm set, and needs at hand.    Therapy Documentation Precautions:  Precautions Precautions: Fall Precaution Comments: Mild expressive aphasia Restrictions Weight Bearing Restrictions: No      Therapy/Group: Individual Therapy  Nakisha Chai 03/10/2022, 10:08 AM

## 2022-03-11 DIAGNOSIS — F411 Generalized anxiety disorder: Secondary | ICD-10-CM | POA: Diagnosis not present

## 2022-03-11 DIAGNOSIS — I6381 Other cerebral infarction due to occlusion or stenosis of small artery: Secondary | ICD-10-CM | POA: Diagnosis not present

## 2022-03-11 DIAGNOSIS — I10 Essential (primary) hypertension: Secondary | ICD-10-CM | POA: Diagnosis not present

## 2022-03-11 NOTE — Progress Notes (Signed)
PROGRESS NOTE   Subjective/Complaints:  Up at eob. Couldn't see her husband yesterday because of his location. Looks forward to seeing him soon though. Appreciates that she could go out to try. Received wrong breakfast today  ROS: Patient denies fever, rash, sore throat, blurred vision, dizziness, nausea, vomiting, diarrhea, cough, shortness of breath or chest pain, joint or back/neck pain, headache     Objective:   No results found. No results for input(s): "WBC", "HGB", "HCT", "PLT" in the last 72 hours.  No results for input(s): "NA", "K", "CL", "CO2", "GLUCOSE", "BUN", "CREATININE", "CALCIUM" in the last 72 hours.   Intake/Output Summary (Last 24 hours) at 03/11/2022 0931 Last data filed at 03/10/2022 1845 Gross per 24 hour  Intake 120 ml  Output 1 ml  Net 119 ml        Physical Exam: Vital Signs Blood pressure (!) 153/49, pulse 73, temperature 98.1 F (36.7 C), temperature source Oral, resp. rate 18, height 5\' 3"  (1.6 m), weight 59.2 kg, SpO2 100 %.   Constitutional: No distress . Vital signs reviewed. HEENT: NCAT, EOMI, oral membranes moist Neck: supple Cardiovascular: RRR without murmur. No JVD    Respiratory/Chest: CTA Bilaterally without wheezes or rales. Normal effort    GI/Abdomen: BS +, non-tender, non-distended Ext: no clubbing, cyanosis, or edema Psych: pleasant, still a little anxious.  Skin: No evidence of breakdown, no evidence of rash Neurologic: Cranial nerves II through XII intact, motor strength is close to 5/5 in bilateral deltoid, bicep, tricep, grip, hip flexor, knee extensors, ankle dorsiflexor and plantar flexor. Mild right sided weakness. Good sitting balance Sensory exam normal for light touch and pain in all 4 limbs. No limb ataxia or cerebellar signs. No abnormal tone appreciated.   Musculoskeletal: Full range of motion in all 4 extremities. No joint swelling   Assessment/Plan: 1.  Functional deficits which require 3+ hours per day of interdisciplinary therapy in a comprehensive inpatient rehab setting. Physiatrist is providing close team supervision and 24 hour management of active medical problems listed below. Physiatrist and rehab team continue to assess barriers to discharge/monitor patient progress toward functional and medical goals  Care Tool:  Bathing    Body parts bathed by patient: Right arm, Left arm, Chest, Abdomen, Front perineal area, Right upper leg, Left upper leg, Face   Body parts bathed by helper: Right lower leg, Left lower leg     Bathing assist Assist Level: Moderate Assistance - Patient 50 - 74%     Upper Body Dressing/Undressing Upper body dressing   What is the patient wearing?: Pull over shirt    Upper body assist Assist Level: Minimal Assistance - Patient > 75%    Lower Body Dressing/Undressing Lower body dressing      What is the patient wearing?: Underwear/pull up     Lower body assist Assist for lower body dressing: Contact Guard/Touching assist     Toileting Toileting    Toileting assist Assist for toileting: Contact Guard/Touching assist     Transfers Chair/bed transfer  Transfers assist     Chair/bed transfer assist level: Contact Guard/Touching assist     Locomotion Ambulation   Ambulation assist  Assist level: Contact Guard/Touching assist Assistive device: Walker-rolling Max distance: 75   Walk 10 feet activity   Assist     Assist level: Contact Guard/Touching assist Assistive device: Walker-rolling   Walk 50 feet activity   Assist Walk 50 feet with 2 turns activity did not occur: Safety/medical concerns (fatigue, orthostatic)  Assist level: Contact Guard/Touching assist Assistive device: Walker-rolling    Walk 150 feet activity   Assist Walk 150 feet activity did not occur: Safety/medical concerns         Walk 10 feet on uneven surface  activity   Assist Walk 10  feet on uneven surfaces activity did not occur: Safety/medical concerns         Wheelchair     Assist Is the patient using a wheelchair?: No             Wheelchair 50 feet with 2 turns activity    Assist            Wheelchair 150 feet activity     Assist          Blood pressure (!) 153/49, pulse 73, temperature 98.1 F (36.7 C), temperature source Oral, resp. rate 18, height 5\' 3"  (1.6 m), weight 59.2 kg, SpO2 100 %.  Medical Problem List and Plan: 1. 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  Antithrombotics: -DVT/anticoagulation:  Pharmaceutical: Lovenox             -antiplatelet therapy: DAPT X 3 weeks followed by Plavix alone 3. Chronic low back pain: Tylenol prn for chronic LBP. Add kpad. 4. Mood: team/family providing egosupport -seeing her husband will help.              -antipsychotic agents: N/A 5. Neuropsych: This patient maybe capable of making decisions on her own behalf. 6. Skin/Wound Care: Routine pressure relief measures.  7. Fluids/Electrolytes/Nutrition: Monitor I/O. Check CMET in am. 8. HTN: Monitor BP TID--continue Torsemide, Losartan and Coreg.  Vitals:   03/10/22 1931 03/11/22 0403  BP: (!) 146/56 (!) 153/49  Pulse: 62 73  Resp: 18 18  Temp: 97.9 F (36.6 C) 98.1 F (36.7 C)  SpO2: 98% 100%  Controlled 6/11 9.  HFrEF/New CM: No signs of overload.  Continue Low salt diet. Strict I/O w/daily wts             -monitor for signs of overload.   Ej fx 30-35%             --on Torsemide, Losartan, Coreg, ASA and atorvastatin.             --plans for GDMT in the future.  10. Hypothyroid: On levothyroxine] 11. H/o depression: Continue Sertraline and Mirtazapine- emotional lability improved vs yesterday               --ativan bid prn as at home.  -ego support by family/rehab team 12. CKD:  BUN/SCr- 28/1.14 @ admission-->improved with IVF--18/1.19 --Avoid nephrotoxic  medications 13. Pre-diabetes: Hgb A1C-6.0. Dietary education.     LOS: 4 days A FACE TO FACE EVALUATION WAS PERFORMED  Meredith Staggers 03/11/2022, 9:31 AM

## 2022-03-12 DIAGNOSIS — I509 Heart failure, unspecified: Secondary | ICD-10-CM

## 2022-03-12 DIAGNOSIS — I639 Cerebral infarction, unspecified: Secondary | ICD-10-CM

## 2022-03-12 DIAGNOSIS — I69319 Unspecified symptoms and signs involving cognitive functions following cerebral infarction: Secondary | ICD-10-CM

## 2022-03-12 LAB — CBC
HCT: 33.4 % — ABNORMAL LOW (ref 36.0–46.0)
Hemoglobin: 11.6 g/dL — ABNORMAL LOW (ref 12.0–15.0)
MCH: 30.9 pg (ref 26.0–34.0)
MCHC: 34.7 g/dL (ref 30.0–36.0)
MCV: 89.1 fL (ref 80.0–100.0)
Platelets: 282 10*3/uL (ref 150–400)
RBC: 3.75 MIL/uL — ABNORMAL LOW (ref 3.87–5.11)
RDW: 12.7 % (ref 11.5–15.5)
WBC: 6.2 10*3/uL (ref 4.0–10.5)
nRBC: 0 % (ref 0.0–0.2)

## 2022-03-12 LAB — BASIC METABOLIC PANEL
Anion gap: 13 (ref 5–15)
BUN: 25 mg/dL — ABNORMAL HIGH (ref 8–23)
CO2: 30 mmol/L (ref 22–32)
Calcium: 9.5 mg/dL (ref 8.9–10.3)
Chloride: 97 mmol/L — ABNORMAL LOW (ref 98–111)
Creatinine, Ser: 1.15 mg/dL — ABNORMAL HIGH (ref 0.44–1.00)
GFR, Estimated: 48 mL/min — ABNORMAL LOW (ref >60)
Glucose, Bld: 105 mg/dL — ABNORMAL HIGH (ref 70–99)
Potassium: 3.4 mmol/L — ABNORMAL LOW (ref 3.5–5.1)
Sodium: 140 mmol/L (ref 135–145)

## 2022-03-12 MED ORDER — ACETAMINOPHEN 325 MG PO TABS
325.0000 mg | ORAL_TABLET | ORAL | Status: DC | PRN
Start: 2022-03-12 — End: 2023-01-18

## 2022-03-12 MED ORDER — POTASSIUM CHLORIDE CRYS ER 10 MEQ PO TBCR
10.0000 meq | EXTENDED_RELEASE_TABLET | Freq: Every day | ORAL | Status: DC
  Administered 2022-03-12 – 2022-03-17 (×6): 10 meq via ORAL
  Filled 2022-03-12 (×6): qty 1

## 2022-03-12 MED ORDER — SENNOSIDES-DOCUSATE SODIUM 8.6-50 MG PO TABS
1.0000 | ORAL_TABLET | Freq: Every day | ORAL | Status: DC
Start: 2022-03-12 — End: 2022-03-17
  Administered 2022-03-12 – 2022-03-16 (×5): 1 via ORAL
  Filled 2022-03-12 (×5): qty 1

## 2022-03-12 NOTE — Progress Notes (Signed)
Speech Language Pathology Daily Session Note  Patient Details  Name: Tamara Bullock MRN: [id number] Date of Birth: 04/15/1941  Today's Date: 03/12/2022 SLP Individual Time: 1435-1500 SLP Individual Time Calculation (min): 25 min  Short Term Goals: Week 1: SLP Short Term Goal 1 (Week 1): Pt will demonstrate speech intelligibility at phrase/simple sentence level with 70% accuracy given supervision A over articulation and increase vocal intensity cues. SLP Short Term Goal 2 (Week 1): Pt will express wants/needs at phrase/simple sentence level with supervision A for word finding strategies. SLP Short Term Goal 3 (Week 1): Pt will demonstrate basic problem solving skills with supervision A verbal cues. SLP Short Term Goal 4 (Week 1): Pt will demonstrate recall of daily, novel information with supervision A for external aids. SLP Short Term Goal 5 (Week 1): Pt will self-monitor and self-correct verbal and functional errors with supervision A verbal cues. SLP Short Term Goal 6 (Week 1): Pt will participate in continued languaged assessment.  Skilled Therapeutic Interventions: Skilled treatment session focused on cognitive-linguistic goals. Upon arrival, patient was anxious about leaving the room due to family members possibly visiting, therefore, the session took place in the room. SLP facilitated session by providing extra time and overall Min verbal cues for use of word-finding strategies during a verbal description task. Mod-Max verbal cues were also needed for use of an increased vocal intensity at the phrase level and for verbal reasoning throughout task. Patient left upright in the wheelchair with alarm on and all needs within reach. Continue with current plan of care.      Pain No/Denies Pain   Therapy/Group: Individual Therapy  Wyndell Cardiff 03/12/2022, 3:26 PM

## 2022-03-12 NOTE — Progress Notes (Signed)
PROGRESS NOTE   Subjective/Complaints:  Husband is reportedly hospitalized pt aphasic but able to explain husband was dehydrated They have 3 kids that can assist with care   ROS: Patient denies fever, rash, sore throat, blurred vision, dizziness, nausea, vomiting, diarrhea, cough, shortness of breath or chest pain, joint or back/neck pain, headache     Objective:   No results found. Recent Labs    03/12/22 0602  WBC 6.2  HGB 11.6*  HCT 33.4*  PLT 282    Recent Labs    03/12/22 0602  NA 140  K 3.4*  CL 97*  CO2 30  GLUCOSE 105*  BUN 25*  CREATININE 1.15*  CALCIUM 9.5     Intake/Output Summary (Last 24 hours) at 03/12/2022 0953 Last data filed at 03/12/2022 0700 Gross per 24 hour  Intake 220 ml  Output --  Net 220 ml         Physical Exam: Vital Signs Blood pressure (!) 151/72, pulse 63, temperature 98.1 F (36.7 C), temperature source Oral, resp. rate 18, height 5\' 3"  (1.6 m), weight 59.2 kg, SpO2 99 %.   General: No acute distress Mood and affect are appropriate Heart: Regular rate and rhythm no rubs murmurs or extra sounds Lungs: Clear to auscultation, breathing unlabored, no rales or wheezes Abdomen: Positive bowel sounds, soft nontender to palpation, nondistended Extremities: No clubbing, cyanosis, or edema  Skin: No evidence of breakdown, no evidence of rash Neurologic: Cranial nerves II through XII intact, motor strength is close to 5/5 in bilateral deltoid, bicep, tricep, grip, hip flexor, knee extensors, ankle dorsiflexor and plantar flexor. Mild right sided weakness. Good sitting balance Sensory exam normal for light touch and pain in all 4 limbs. No limb ataxia or cerebellar signs. No abnormal tone appreciated.   Musculoskeletal: Full range of motion in all 4 extremities. No joint swelling   Assessment/Plan: 1. Functional deficits which require 3+ hours per day of interdisciplinary  therapy in a comprehensive inpatient rehab setting. Physiatrist is providing close team supervision and 24 hour management of active medical problems listed below. Physiatrist and rehab team continue to assess barriers to discharge/monitor patient progress toward functional and medical goals  Care Tool:  Bathing    Body parts bathed by patient: Right arm, Left arm, Chest, Abdomen, Front perineal area, Right upper leg, Left upper leg, Face   Body parts bathed by helper: Right lower leg, Left lower leg     Bathing assist Assist Level: Moderate Assistance - Patient 50 - 74%     Upper Body Dressing/Undressing Upper body dressing   What is the patient wearing?: Pull over shirt    Upper body assist Assist Level: Minimal Assistance - Patient > 75%    Lower Body Dressing/Undressing Lower body dressing      What is the patient wearing?: Underwear/pull up     Lower body assist Assist for lower body dressing: Contact Guard/Touching assist     Toileting Toileting    Toileting assist Assist for toileting: Contact Guard/Touching assist     Transfers Chair/bed transfer  Transfers assist     Chair/bed transfer assist level: Contact Guard/Touching assist     Locomotion Ambulation  Ambulation assist      Assist level: Contact Guard/Touching assist Assistive device: Walker-rolling Max distance: 75   Walk 10 feet activity   Assist     Assist level: Contact Guard/Touching assist Assistive device: Walker-rolling   Walk 50 feet activity   Assist Walk 50 feet with 2 turns activity did not occur: Safety/medical concerns (fatigue, orthostatic)  Assist level: Contact Guard/Touching assist Assistive device: Walker-rolling    Walk 150 feet activity   Assist Walk 150 feet activity did not occur: Safety/medical concerns         Walk 10 feet on uneven surface  activity   Assist Walk 10 feet on uneven surfaces activity did not occur: Safety/medical  concerns         Wheelchair     Assist Is the patient using a wheelchair?: No             Wheelchair 50 feet with 2 turns activity    Assist            Wheelchair 150 feet activity     Assist          Blood pressure (!) 151/72, pulse 63, temperature 98.1 F (36.7 C), temperature source Oral, resp. rate 18, height 5\' 3"  (1.6 m), weight 59.2 kg, SpO2 99 %.  Medical Problem List and Plan: 1. Functional deficits secondary to large left basal ganglia infarct             -patient may shower             -ELOS/Goals: 7-10 days S            -Continue CIR therapies including PT, OT, and SLP   -Grounds pass ordered 2.  Antithrombotics: -DVT/anticoagulation:  Pharmaceutical: Lovenox             -antiplatelet therapy: DAPT X 3 weeks followed by Plavix alone 3. Chronic low back pain: Tylenol prn for chronic LBP. Add kpad. 4. Mood: team/family providing egosupport -seeing her husband will help.              -antipsychotic agents: N/A 5. Neuropsych: This patient maybe capable of making decisions on her own behalf. 6. Skin/Wound Care: Routine pressure relief measures.  7. Fluids/Electrolytes/Nutrition: Monitor I/O.     Latest Ref Rng & Units 03/12/2022    6:02 AM 03/08/2022    5:51 AM 03/07/2022    5:35 AM  BMP  Glucose 70 - 99 mg/dL 105  109  104   BUN 8 - 23 mg/dL 25  26  18    Creatinine 0.44 - 1.00 mg/dL 1.15  1.34  1.19   Sodium 135 - 145 mmol/L 140  138  136   Potassium 3.5 - 5.1 mmol/L 3.4  3.8  3.5   Chloride 98 - 111 mmol/L 97  101  102   CO2 22 - 32 mmol/L 30  26  23    Calcium 8.9 - 10.3 mg/dL 9.5  9.3  9.2    Low K+ will supplement 8. HTN: Monitor BP TID--continue Torsemide, Losartan and Coreg.  Vitals:   03/11/22 1950 03/12/22 0407  BP: (!) 137/46 (!) 151/72  Pulse: (!) 58 63  Resp: 18 18  Temp: 98.1 F (36.7 C) 98.1 F (36.7 C)  SpO2: 0000000 123456  Mild systolic elevation [id number] 9.  HFrEF/New CM: No signs of overload.  Continue Low salt diet.  Strict I/O w/daily wts             -monitor for  signs of overload.   Ej fx 30-35%             --on Torsemide, Losartan, Coreg, ASA and atorvastatin.             --plans for GDMT in the future.  10. Hypothyroid: On levothyroxine] 11. H/o depression: Continue Sertraline and Mirtazapine- emotional lability improved vs yesterday              --ativan bid prn as at home.  -ego support by family/rehab team 12. CKD:  BUN/SCr- 28/1.14 @ admission-->improved with IVF--18/1.19 --Avoid nephrotoxic  medications 13. Pre-diabetes: Hgb A1C-6.0. Dietary education.     LOS: 5 days A FACE TO FACE EVALUATION WAS PERFORMED  Charlett Blake 03/12/2022, 9:53 AM

## 2022-03-12 NOTE — Discharge Summary (Incomplete)
Physician Discharge Summary  Patient ID: Tamara Bullock MRN: [id number] DOB/AGE: 83-12-1940 83 y.o.  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 accident (CVA) ([place])   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 (CVA)   Congestive heart failure ([place])   Discharged Condition: stable  Significant Diagnostic Studies:  Labs:  Basic Metabolic Panel:    Latest Ref Rng & Units 03/15/2022   10:26 AM 03/12/2022    6:02 AM 03/08/2022    5:51 AM  BMP  Glucose 70 - 99 mg/dL 118  105  109   BUN 8 - 23 mg/dL 26  25  26    Creatinine 0.44 - 1.00 mg/dL 1.21  1.15  1.34   Sodium 135 - 145 mmol/L 137  140  138   Potassium 3.5 - 5.1 mmol/L 3.6  3.4  3.8   Chloride 98 - 111 mmol/L 98  97  101   CO2 22 - 32 mmol/L 28  30  26    Calcium 8.9 - 10.3 mg/dL 9.9  9.5  9.3      CBC:    Latest Ref Rng & Units 03/12/2022    6:02 AM 03/08/2022    5:51 AM 03/07/2022    5:35 AM  CBC  WBC 4.0 - 10.5 K/uL 6.2  8.4  7.4   Hemoglobin 12.0 - 15.0 g/dL 11.6  12.6  12.5   Hematocrit 36.0 - 46.0 % 33.4  36.3  36.0   Platelets 150 - 400 K/uL 282  268  245       CBG: No results for input(s): "GLUCAP" in the last 168 hours.  Brief HPI:   Tamara Bullock is a 81 y.o. female ***   Hospital Course: Tamara Bullock was admitted to rehab 03/07/2022 for inpatient therapies to consist of PT, ST and OT at least three hours five days a week. Past admission physiatrist, therapy team and rehab RN have worked together to provide customized collaborative inpatient rehab.   Blood pressures were monitored on TID basis and   Diabetes has been monitored with ac/hs CBG checks and SSI was use prn for tighter BS control.    Rehab course: During patient's stay in rehab weekly team conferences were held to monitor  patient's progress, set goals and discuss barriers to discharge. At admission, patient required mod assist with ADL tasks and min assist with mobility. She exhibited  mild to moderate expressive aphasia which was impacted by reduced vocal intensity and misarticulation. She also exhibited cognitive deficits with SLUMS score 15/30. She  has had improvement in activity tolerance, balance, postural control as well as ability to compensate for deficits.      Disposition: Home  Diet: heart healthy.  Limit sweets/carbs  Special Instructions: No alcohol or driving till cleared by MD. 2. Repeat BMET and CBC in 1-2 weeks to follow up on renal function and H/H.  Discharge Instructions     Ambulatory referral to Neurology   Complete by: As directed    An appointment is requested in approximately: 4-6 weeks   Ambulatory referral to Physical Medicine Rehab   Complete by: As directed       Allergies as of 03/17/2022       Reactions   Penicillin G Hives   Sulfa Antibiotics Hives   Aciphex [rabeprazole]  Amitriptyline    Aspirin    Azithromycin    Carbocaine [mepivacaine]    Cimetidine    Ciprofloxacin    Cisapride    Clindamycin/lincomycin    Cortisporin-tc [neomycin-colist-hc-thonzonium]    Escitalopram    Hydrochlorothiazide    Hydrocodone    Ibuprofen    Iodine    Macrodantin [nitrofurantoin]    Medroxyprogesterone    Meloxicam    Minocycline    Moxifloxacin    Chest pain    Nitrofuran Derivatives    Nsaids    Ofloxacin    Pantoprazole    Pneumococcal Vaccines    Prednisolone    Prednisone    Prevacid [lansoprazole]    Propoxyphene    Soap    Spironolactone    Tobradex [tobramycin-dexamethasone]    Tramadol    Trolamine Salicylate    Verapamil    Voltaren [diclofenac]         Medication List     TAKE these medications    acetaminophen 325 MG tablet Commonly known as: TYLENOL Take 1-2 tablets (325-650 mg total) by mouth every 4 (four) hours as needed for  mild pain.   Aspirin Low Dose 81 MG tablet Generic drug: aspirin EC Take 1 tablet (81 mg total) by mouth daily. Swallow whole. What changed: Another medication with the same name was removed. Continue taking this medication, and follow the directions you see here. Notes to patient: Stop taking after 06/24   atorvastatin 80 MG tablet Commonly known as: LIPITOR Take 1 tablet (80 mg total) by mouth daily.   carvedilol 3.125 MG tablet Commonly known as: COREG Take 1 tablet (3.125 mg total) by mouth 2 (two) times daily with a meal.   clopidogrel 75 MG tablet Commonly known as: PLAVIX Take 1 tablet (75 mg total) by mouth daily.   Euthyrox 50 MCG tablet Generic drug: levothyroxine Take 50 mcg by mouth daily.   LORazepam 0.5 MG tablet Commonly known as: ATIVAN Take 0.5 tablets (0.25 mg total) by mouth 2 (two) times daily as needed for anxiety.   losartan 25 MG tablet Commonly known as: COZAAR Take 0.5 tablets (12.5 mg total) by mouth daily.   mirtazapine 7.5 MG tablet Commonly known as: REMERON Take 1 tablet (7.5 mg total) by mouth at bedtime.   potassium chloride 10 MEQ tablet Commonly known as: KLOR-CON M Take 1 tablet (10 mEq total) by mouth daily.   Senexon-S 8.6-50 MG tablet Generic drug: senna-docusate Take 1 tablet by mouth daily after supper.   sertraline 25 MG tablet Commonly known as: ZOLOFT Take 1 tablet (25 mg total) by mouth daily.   torsemide 20 MG tablet Commonly known as: DEMADEX Take 2 tablets (40 mg total) by mouth daily.        Follow-up Information     Kirsteins, Luanna Salk, MD Follow up.   Specialty: Physical Medicine and Rehabilitation Why: office will call you with follow up appointment Contact information: [place] [address] [phone]         Corrington, Delsa Grana, MD Follow up.   Specialty: Family Medicine Why: Call in 1-2 days for post hospital follow up Contact information: [place] [address] [employer] [employer] [address] [place] Follow up.   Why: office will call you with follow up appointment Contact information: [address]     [address]  [place] [phone]  Signed: Bary Leriche 03/19/2022, 9:07 AM

## 2022-03-12 NOTE — Progress Notes (Signed)
Occupational Therapy Session Note  Patient Details  Name: Tamara Bullock MRN: [id number] Date of Birth: 06-23-1941  Today's Date: 03/12/2022 OT Individual Time: 1500-1600 OT Individual Time Calculation (min): 60 min    Short Term Goals: Week 1:  OT Short Term Goal 1 (Week 1): Pt will perform sit to stand in preparation for ADL's and mobility with close S ad min cues OT Short Term Goal 2 (Week 1): Pt will complete UB bathing and dressing with set up and min cues OT Short Term Goal 3 (Week 1): Pt will complete LB bathing and dressing with close S after set up and min cues with intermittent standing with unilateral UE support OT Short Term Goal 4 (Week 1): Pt will perform toilet and shower bench transfers with close s and min cues using grab bar for support OT Short Term Goal 5 (Week 1): Pt will stand up to 5 minutes for simple item retrieval at kitchen counter with close S   Therapy Documentation Precautions:  Precautions Precautions: Fall Precaution Comments: Mild expressive aphasia Restrictions Weight Bearing Restrictions: No General:   Upon OT arrival, pt seated in w/c with reports of no pain. Pt agreeable to OT treatment session. Pt completes shower ADL at the levels listed below. Pt ambulates to, within, and from bathroom without DME with Supervision fading to CGA towards end of session secondary to decreased endurance and activity tolerance. Pt completed toileting twice during session including having a BM. Pt able to engage in higher level IADL task retrieving dirty linens from ground and placing into laundry sack without LOB or UE support but requires increased efforts. Pt completes with CGA. Pt donns facial cream with Mod I seated EOB at end of session. Pt complete sit to semi recumbent with Mod I. Pt making progress towards stated OT goals and continues to benefit from OT services to achieve highest level of independence. Pt left in bed with all safety measures in place.     ADL: Upper Body Bathing: Supervision/safety Where Assessed-Upper Body Bathing: Shower Lower Body Bathing: Supervision/safety Where Assessed-Lower Body Bathing: Shower Upper Body Dressing: Contact guard Where Assessed-Upper Body Dressing: Other (Comment) (standing) Lower Body Dressing: Contact guard Where Assessed-Lower Body Dressing: Wheelchair, Other (Comment) (standing) Toileting: Supervision/safety Where Assessed-Toileting: Glass blower/designer: Therapist, music Method: Counselling psychologist: Energy manager: Curator Method: Heritage manager: Radio broadcast assistant ADL Comments: pt requires increased time and effort with mild R UE/LE weakness thus benefits from hemi technique support and cues, fatigues easily, performed full shower and ADL routine with support of OT  Therapy/Group: Individual Therapy  Marvetta Gibbons 03/12/2022, 4:55 PM

## 2022-03-12 NOTE — Discharge Instructions (Addendum)
Inpatient Rehab Discharge Instructions  Tamara Bullock Discharge date and time:  03/17/22  Activities/Precautions/ Functional Status: Activity: no lifting, driving, or strenuous exercise till cleared by MD Diet: cardiac diet --limit Carb/sweet intake (pre-diabetes)  Wound Care: none needed   Functional status:  ___ No restrictions     ___ Walk up steps independently _X__ 24/7 supervision/assistance   ___ Walk up steps with assistance ___ Intermittent supervision/assistance  ___ Bathe/dress independently ___ Walk with walker     ___ Bathe/dress with assistance ___ Walk Independently    ___ Shower independently ___ Walk with assistance    _X__ Shower with assistance _X__ No alcohol     ___ Return to work/school ________  Special Instructions:  STROKE/TIA DISCHARGE INSTRUCTIONS SMOKING Cigarette smoking nearly doubles your risk of having a stroke & is the single most alterable risk factor  If you smoke or have smoked in the last 12 months, you are advised to quit smoking for your health. Most of the excess cardiovascular risk related to smoking disappears within a year of stopping. Ask you doctor about anti-smoking medications [facility] Quit Line: 1-800-QUIT NOW Free Smoking Cessation Classes [phone]  CHOLESTEROL Know your levels; limit fat & cholesterol in your diet  Lipid Panel     Component Value Date/Time   CHOL 278 (H) 03/03/2022 0414   TRIG 112 03/03/2022 0414   HDL 58 03/03/2022 0414   CHOLHDL 4.8 03/03/2022 0414   VLDL 22 03/03/2022 0414   LDLCALC 198 (H) 03/03/2022 0414     Many patients benefit from treatment even if their cholesterol is at goal. Goal: Total Cholesterol (CHOL) less than 160 Goal:  Triglycerides (TRIG) less than 150 Goal:  HDL greater than 40 Goal:  LDL (LDLCALC) less than 100   BLOOD PRESSURE American Stroke Association blood pressure target is less that 120/80 mm/Hg  Your discharge blood pressure is:  BP: (!) 154/61 Monitor your blood  pressure Limit your salt and alcohol intake Many individuals will require more than one medication for high blood pressure  DIABETES (A1c is a blood sugar average for last 3 months) Goal HGBA1c is under 7% (HBGA1c is blood sugar average for last 3 months)  Diabetes: Pre-diabetes    Lab Results  Component Value Date   HGBA1C 6.0 (H) 03/03/2022    Your HGBA1c can be lowered with medications, healthy diet, and exercise. Check your blood sugar as directed by your physician Call your physician if you experience unexplained or low blood sugars.  PHYSICAL ACTIVITY/REHABILITATION Goal is 30 minutes at least 4 days per week  Activity: No driving, Therapies: see above Return to work: N/A Activity decreases your risk of heart attack and stroke and makes your heart stronger.  It helps control your weight and blood pressure; helps you relax and can improve your mood. Participate in a regular exercise program. Talk with your doctor about the best form of exercise for you (dancing, walking, swimming, cycling).  DIET/WEIGHT Goal is to maintain a healthy weight  Your discharge diet is:  Diet Order             Diet Heart Room service appropriate? Yes; Fluid consistency: Thin  Diet effective now                   liquids Your height is:  Height: 5\' 3"  (160 cm) Your current weight is: Weight: 62.3 kg Your Body Mass Index (BMI) is:  23 Following the type of diet specifically designed for you will  help prevent another stroke. You are at goal weight    Your goal Body Mass Index (BMI) is 19-24. Healthy food habits can help reduce 3 risk factors for stroke:  High cholesterol, hypertension, and excess weight.  RESOURCES Stroke/Support Group:  Call [phone]   STROKE EDUCATION PROVIDED/REVIEWED AND GIVEN TO PATIENT Stroke warning signs and symptoms How to activate emergency medical system (call 911). Medications prescribed at discharge. Need for follow-up after discharge. Personal risk factors  for stroke. Pneumonia vaccine given:  Flu vaccine given:  My questions have been answered, the writing is legible, and I understand these instructions.  I will adhere to these goals & educational materials that have been provided to me after my discharge from the hospital.      My questions have been answered and I understand these instructions. I will adhere to these goals and the provided educational materials after my discharge from the hospital.  Patient/Caregiver Signature _______________________________ Date __________  Clinician Signature _______________________________________ Date __________  Please bring this form and your medication list with you to all your follow-up doctor's appointments.

## 2022-03-12 NOTE — Progress Notes (Signed)
Physical Therapy Session Note  Patient Details  Name: Tamara Bullock MRN: [id number] Date of Birth: March 16, 1941  Today's Date: 03/12/2022 PT Individual Time: 0800-0910 PT Individual Time Calculation (min): 70 min   Short Term Goals: Week 1:  PT Short Term Goal 1 (Week 1): STG = LTG due to ELOS Week 2:    Week 3:     Skilled Therapeutic Interventions/Progress Updates:     Pt received hand off from nursing seated EOB. Pt fully dressed, wearing jeans, no TEDs.  Declined donning Teds or abd binder despite encouragement/education.  stand pivot transfer to wc w/min assist, additional time.  Sit to stand at sink w/min assist for use of mirror. Transported to gym Seated therex as warmup/hypotension management: LAQs, heel raises, marching 2x10 each  Sit to stand w/cga to RW repeated throughout session.  Gait trials: w/focus on repeated short distances to avoid OH  Gait 68ft w/RW, cga. Gait 7ft w/RW, cga.   Functional strengthening: Repeated step ups onto 3in step using Rw x 10 reps.  Step up/back down 2 stairs w/2 rails x 2, c/o dizzyness.  Again discussed fall risk, dizzyness, OH.  Pt agreeable to donning Ted hose at this point.  Transported to room. Lowers pants and raises pants in sitting/standing w/cga. Therapist manages shoes/socks, donning TEDs, pants from/to ankles.  Pt dons/doffs sweater during se4ssion w/set up only.g Pt requested use of BR. stand pivot transfer wc tofrom commode w/cga, manages pants w/cga, performs hygiene w/supervision in sitting. Pt c/o "I don't feel well at all".   stand pivot transfer to bed w/min assist.  Sit to supine w/supervision.  BP supine w/TEDS 125/51  Note placed above pt bed to remind staff to don TEDs in am during initial dressing.  Pt left supine w/rails up x 3, alarm set, bed in lowest position, and needs in reach.   Therapy Documentation Precautions:  Precautions Precautions: Fall Precaution Comments: Mild expressive  aphasia Restrictions Weight Bearing Restrictions: No    Therapy/Group: Individual Therapy Rada Hay, PT   Shearon Balo 03/12/2022, 9:09 AM

## 2022-03-13 NOTE — Progress Notes (Signed)
Physical Therapy Session Note  Patient Details  Name: Tamara Bullock MRN: [id number] Date of Birth: 14-Jul-1941  Today's Date: 03/13/2022 PT Individual Time: 0800-0915 PT Individual Time Calculation (min): 75 min   Short Term Goals: Week 1:  PT Short Term Goal 1 (Week 1): STG = LTG due to ELOS Week 2:    Week 3:     Skilled Therapeutic Interventions/Progress Updates:    Pt initially supine, anxious, dressed but no TED hose. Doffs pants independently, therapist removed and redressed shoes/socks and donned teds, pt donned pants w/min assist. Supine/no TEDs Bp 95/67 Seated BP w/TEDS 127/53 Sit to stand to RW w/cues for hand placement Gait 57ft to commode w/close supervision, commode transfer and clothing management w/close supervision. Continent of urine and supervision for hygiene, cga w/gait to wc. Transported to gym.  Nustep performed as cardiovascular warmup/conditioninig prior to gait in effort to counter tendecy for [facility].  stand pivot transfer to Nustep w/cues for hand placement. Nustep L3 X 7 min for 150steps  Gait from Nustep: 60ft w/RW and cga    BP 118/52  Gait 62ft w/RW and cga BP 117/50  Standing balance: Reaching in standing using numbers placed on wall in large clock pattern, moves slowly/safely, uses RUE to include crossbody reaching. REPEATed several times. L hand then alternating hands w/seated rest between efforts, no dizzyness. BP 125/48  Gait    w/RW w/cga.  Pt left oob in wc w/alarm belt set and needs in reach   Therapy Documentation Precautions:  Precautions Precautions: Fall Precaution Comments: Mild expressive aphasia Restrictions Weight Bearing Restrictions: No   Therapy/Group: Individual Therapy Rada Hay, PT   Shearon Balo 03/13/2022, 9:15 AM

## 2022-03-13 NOTE — Progress Notes (Signed)
PROGRESS NOTE   Subjective/Complaints:  Seen in PT. Pt without c/os , did not have TED hose yesterday and had dizziness with standing   ROS: Patient denies CP, SOB, N/V/D    Objective:   No results found. Recent Labs    03/12/22 0602  WBC 6.2  HGB 11.6*  HCT 33.4*  PLT 282     Recent Labs    03/12/22 0602  NA 140  K 3.4*  CL 97*  CO2 30  GLUCOSE 105*  BUN 25*  CREATININE 1.15*  CALCIUM 9.5      Intake/Output Summary (Last 24 hours) at 03/13/2022 0840 Last data filed at 03/13/2022 0718 Gross per 24 hour  Intake 580 ml  Output --  Net 580 ml         Physical Exam: Vital Signs Blood pressure (!) 150/47, pulse 65, temperature 97.8 F (36.6 C), resp. rate 16, height 5\' 3"  (1.6 m), weight 59.2 kg, SpO2 97 %.   General: No acute distress Mood and affect are appropriate Heart: Regular rate and rhythm no rubs murmurs or extra sounds Lungs: Clear to auscultation, breathing unlabored, no rales or wheezes Abdomen: Positive bowel sounds, soft nontender to palpation, nondistended Extremities: No clubbing, cyanosis, or edema  Skin: No evidence of breakdown, no evidence of rash Neurologic: Cranial nerves II through XII intact, motor strength is close to 5/5 in bilateral deltoid, bicep, tricep, grip, hip flexor, knee extensors, ankle dorsiflexor and plantar flexor. Mild right sided weakness. Good sitting balance Sensory exam normal for light touch and pain in all 4 limbs. No limb ataxia or cerebellar signs. No abnormal tone appreciated.   Musculoskeletal: Full range of motion in all 4 extremities. No joint swelling   Assessment/Plan: 1. Functional deficits which require 3+ hours per day of interdisciplinary therapy in a comprehensive inpatient rehab setting. Physiatrist is providing close team supervision and 24 hour management of active medical problems listed below. Physiatrist and rehab team continue to  assess barriers to discharge/monitor patient progress toward functional and medical goals  Care Tool:  Bathing    Body parts bathed by patient: Right arm, Left arm, Chest, Abdomen, Front perineal area, Right upper leg, Left upper leg, Face, Right lower leg, Left lower leg, Buttocks   Body parts bathed by helper: Right lower leg, Left lower leg     Bathing assist Assist Level: Contact Guard/Touching assist     Upper Body Dressing/Undressing Upper body dressing   What is the patient wearing?: Pull over shirt    Upper body assist Assist Level: Contact Guard/Touching assist    Lower Body Dressing/Undressing Lower body dressing      What is the patient wearing?: Underwear/pull up, Pants     Lower body assist Assist for lower body dressing: Contact Guard/Touching assist     Toileting Toileting    Toileting assist Assist for toileting: Supervision/Verbal cueing     Transfers Chair/bed transfer  Transfers assist     Chair/bed transfer assist level: Contact Guard/Touching assist     Locomotion Ambulation   Ambulation assist      Assist level: Contact Guard/Touching assist Assistive device: Walker-rolling Max distance: 75   Walk 10 feet  activity   Assist     Assist level: Contact Guard/Touching assist Assistive device: Walker-rolling   Walk 50 feet activity   Assist Walk 50 feet with 2 turns activity did not occur: Safety/medical concerns (fatigue, orthostatic)  Assist level: Contact Guard/Touching assist Assistive device: Walker-rolling    Walk 150 feet activity   Assist Walk 150 feet activity did not occur: Safety/medical concerns         Walk 10 feet on uneven surface  activity   Assist Walk 10 feet on uneven surfaces activity did not occur: Safety/medical concerns         Wheelchair     Assist Is the patient using a wheelchair?: No             Wheelchair 50 feet with 2 turns activity    Assist             Wheelchair 150 feet activity     Assist          Blood pressure (!) 150/47, pulse 65, temperature 97.8 F (36.6 C), resp. rate 16, height 5\' 3"  (1.6 m), weight 59.2 kg, SpO2 97 %.  Medical Problem List and Plan: 1. 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  Antithrombotics: -DVT/anticoagulation:  Pharmaceutical: Lovenox             -antiplatelet therapy: DAPT X 3 weeks followed by Plavix alone 3. Chronic low back pain: Tylenol prn for chronic LBP. Add kpad. 4. Mood: team/family providing egosupport -seeing her husband will help.              -antipsychotic agents: N/A 5. Neuropsych: This patient maybe capable of making decisions on her own behalf. 6. Skin/Wound Care: Routine pressure relief measures.  7. Fluids/Electrolytes/Nutrition: Monitor I/O.     Latest Ref Rng & Units 03/12/2022    6:02 AM 03/08/2022    5:51 AM 03/07/2022    5:35 AM  BMP  Glucose 70 - 99 mg/dL 05/07/2022  008  676   BUN 8 - 23 mg/dL 25  26  18    Creatinine 0.44 - 1.00 mg/dL 195   0.93   Sodium 135 - 145 mmol/L 140  138  136   Potassium 3.5 - 5.1 mmol/L 3.4  3.8  3.5   Chloride 98 - 111 mmol/L 97  101  102   CO2 22 - 32 mmol/L 30  26  23    Calcium 8.9 - 10.3 mg/dL 9.5  9.3  9.2    Low K+ will supplement 8. HTN: Monitor BP TID--continue Torsemide, Losartan and Coreg.  Vitals:   03/13/22 0347 03/13/22 0744  BP: (!) 149/46 (!) 150/47  Pulse: (!) 57 65  Resp:  16  Temp:    SpO2:    Mild systolic elevation 6/13, check ortho vitals  9.  HFrEF/New CM: No signs of overload.  Continue Low salt diet. Strict I/O w/daily wts             -monitor for signs of overload.   Ej fx 30-35%             --on Torsemide, Losartan, Coreg,  ASA and atorvastatin.             --plans for GDMT in the future.  10.  Hypothyroid: On levothyroxine] 11. H/o depression: Continue Sertraline and Mirtazapine- emotional lability improved vs yesterday              --ativan bid prn as at home.  -ego support by family/rehab team 12. CKD:  BUN/SCr- 28/1.14 @ admission-->improved with IVF--18/1.19 --Avoid nephrotoxic  medications 13. Pre-diabetes: Hgb A1C-6.0. Dietary education.   14.  Mild hypo K+due to torsemide, added KCL qd  LOS: 6 days A FACE TO FACE EVALUATION WAS PERFORMED  Erick Colace 03/13/2022, 8:40 AM

## 2022-03-13 NOTE — Progress Notes (Signed)
Physical Therapy Session Note  Patient Details  Name: SHAKARI QAZI MRN: [id number] Date of Birth: 10-13-1940  Today's Date: 03/13/2022 PT Individual Time: 0211-1735 PT Individual Time Calculation (min): 28 min   Short Term Goals: Week 1:  PT Short Term Goal 1 (Week 1): STG = LTG due to ELOS  Skilled Therapeutic Interventions/Progress Updates: Pt presented in bed agreeable to therapy. Pt states no pain and feel a little mentally better than this am. BP checked prior to OOB with TED hose on 135/47 (70) HR 58. Performed supine to sit with close supervision, increased time and use of bed features. Performed seated LE therex prior to ambulation including ankle pumps, LAQ, and seated march x 15 bilaterally. Pt states in dizziness in sitting or during activity. Pt then participated in ambulation with RW for endurance with distances of 115 and 120f with seated rest between bouts. Pt did not indicate any symptoms during ambulation and BP checked after first bout of ambulation 147/49 (76) HR 55. Pt returned to room at end of session and was able to doff shoes at EOB with supervision and increased time. Pt returned to bed with supervision and repositioned to comfort. Pt left in bed at end of session with bed alarm on, call bell within reach and needs met.      Therapy Documentation Precautions:  Precautions Precautions: Fall Precaution Comments: Mild expressive aphasia Restrictions Weight Bearing Restrictions: No General:   Vital Signs: Therapy Vitals Temp: 97.8 F (36.6 C) Temp Source: Oral Pulse Rate: (!) 51 Resp: 18 BP: (!) 131/48 Patient Position (if appropriate): Sitting Oxygen Therapy SpO2: 98 % O2 Device: Room Air Pain: Pain Assessment Pain Score: 0-No pain Mobility:   Locomotion :    Trunk/Postural Assessment :    Balance:   Exercises:   Other Treatments:      Therapy/Group: Individual Therapy  Maanya Hippert 03/13/2022, 3:54 PM

## 2022-03-13 NOTE — Progress Notes (Signed)
Patient ID: Tamara Bullock, female   DOB: [birth year], 81 y.o.   MRN: [id number]  Sw spoke with patient daughter, Darreld Mclean. Daughter reports that patient spouse is currently in a rehab facility and will not be able to provide assistance to patient. Patient daughter reports the children will provide 24/7 supervision to patient. Family waiting on determination of how much care is expected at home to determine hiring assistance. SW will follow up with patient daughter tomorrow to provide conference updates.

## 2022-03-13 NOTE — Progress Notes (Signed)
Occupational Therapy Session Note  Patient Details  Name: Tamara Bullock MRN: [id number] Date of Birth: July 16, 1941  Today's Date: 03/13/2022 OT Individual Time: 1130-1200 OT Individual Time Calculation (min): 30 min    Short Term Goals: Week 1:  OT Short Term Goal 1 (Week 1): Pt will perform sit to stand in preparation for ADL's and mobility with close S ad min cues OT Short Term Goal 2 (Week 1): Pt will complete UB bathing and dressing with set up and min cues OT Short Term Goal 3 (Week 1): Pt will complete LB bathing and dressing with close S after set up and min cues with intermittent standing with unilateral UE support OT Short Term Goal 4 (Week 1): Pt will perform toilet and shower bench transfers with close s and min cues using grab bar for support OT Short Term Goal 5 (Week 1): Pt will stand up to 5 minutes for simple item retrieval at kitchen counter with close S  Skilled Therapeutic Interventions/Progress Updates:    S: Pt reports that she has been worried about her husband, who is also in the hospital.    O: Functional transfer was completed with SBA using RW from bed to toilet; patient ambulated using RW with VC for safety awareness  and technique. Toileting completed with SBA. Pt able to demonstrate good balance and safety awareness. Pt ambulated to and from Day Room with CGA and RW to work on increasing activity tolerance and energy conservation.  Fine motor coordination focused on during card flip and card sorting task while encouraging using RUE as dominant extremity.    A: Patient presents with increased worry regarding her situation and her husband's. Provided an avenue for patient to voice her fears and provided emotional support during session to help ease some worry. Session focused on integrating fine motor coordination and activity tolerance activities throughout session in order to improve functional use of her RUE as her dominant extremity when participating in  ADL tasks and navigating within her environment safely and while demonstrating increased endurance. Provided VC throughout session for technique, activity modification, safety awareness tips. Patient verbalized understanding and demonstrated carry over of education when able.   P: Continue to work on increasing overall independence with ADL tasks.   Therapy Documentation Precautions:  Precautions Precautions: Fall Precaution Comments: Mild expressive aphasia Restrictions Weight Bearing Restrictions: No  Pain: No pain reported during session.    Therapy/Group: Individual Therapy  Limmie Patricia, OTR/L,CBIS  Supplemental OT - [facility]  03/13/2022, 8:02 AM

## 2022-03-13 NOTE — Progress Notes (Signed)
Speech Language Pathology Daily Session Note  Patient Details  Name: Tamara Bullock MRN: [id number] Date of Birth: 1941-05-06  Today's Date: 03/13/2022 SLP Individual Time: 1000-1100 SLP Individual Time Calculation (min): 60 min  Short Term Goals: Week 1: SLP Short Term Goal 1 (Week 1): Pt will demonstrate speech intelligibility at phrase/simple sentence level with 70% accuracy given supervision A over articulation and increase vocal intensity cues. SLP Short Term Goal 2 (Week 1): Pt will express wants/needs at phrase/simple sentence level with supervision A for word finding strategies. SLP Short Term Goal 3 (Week 1): Pt will demonstrate basic problem solving skills with supervision A verbal cues. SLP Short Term Goal 4 (Week 1): Pt will demonstrate recall of daily, novel information with supervision A for external aids. SLP Short Term Goal 5 (Week 1): Pt will self-monitor and self-correct verbal and functional errors with supervision A verbal cues. SLP Short Term Goal 6 (Week 1): Pt will participate in continued languaged assessment.  Skilled Therapeutic Interventions:Skilled ST services focused on speech skills. Pt expressed concerns about husband and missing family. SLP provided emotional support. SLP facilitated education on diaphragmatic breathing exercises (providing hangout) and coordinating phonation/respiration. Pt was able to return demonstration of diaphragmatic breathing exercises x5, but supported feeling of dizziness. Pt required mod A verbal cues fade to min A to utilize pausing to take a breath every 1-2 words to increase breath support and intelligibility. Pt was able to increase speech intelligibility at phrase with use of strategies to 80%, but without strategies 65% intelligibility. Pt required min A fade to supervision A semantic cues for word finding during picture description task. Pt was left in room with call bell within reach and bed/chair alarm set. SLP recommends to  continue skilled services.      Pain Pain Assessment Pain Score: 0-No pain  Therapy/Group: Individual Therapy  Jung Yurchak  [facility] 03/13/2022, 11:57 AM

## 2022-03-14 NOTE — Progress Notes (Signed)
Speech Language Pathology Daily Session Note  Patient Details  Name: Tamara Bullock MRN: [id number] Date of Birth: 10/12/1940  Today's Date: 03/14/2022 SLP Individual Time: 0900-1000 SLP Individual Time Calculation (min): 60 min  Short Term Goals: Week 1: SLP Short Term Goal 1 (Week 1): Pt will demonstrate speech intelligibility at phrase/simple sentence level with 70% accuracy given supervision A over articulation and increase vocal intensity cues. SLP Short Term Goal 2 (Week 1): Pt will express wants/needs at phrase/simple sentence level with supervision A for word finding strategies. SLP Short Term Goal 3 (Week 1): Pt will demonstrate basic problem solving skills with supervision A verbal cues. SLP Short Term Goal 4 (Week 1): Pt will demonstrate recall of daily, novel information with supervision A for external aids. SLP Short Term Goal 5 (Week 1): Pt will self-monitor and self-correct verbal and functional errors with supervision A verbal cues. SLP Short Term Goal 6 (Week 1): Pt will participate in continued languaged assessment.  Skilled Therapeutic Interventions: Skilled ST treatment focused on cognitive and speech goals. With today being SLP's first encounter with pt, SLP asked pt to briefly summarize what she has been working on in rehab. Pt exhibited decreased intellectual awareness of deficits and limited recall of events from previous therapy sessions and therefore unable to comment even with contextual and leading question cues. Pt often stating "I don't know" and "I don't pay attention to that." However, important to consider the impact of language deficits on describing deficits. Pt required extended processing time and multiple attempts w/ verbal repetition and rephrasing to comprehend questions and basic commands. Pt abruptly attempted to stand despite safety belt in place to get to bathroom. Pt stated she was able to ambulate to bathroom independently and with limited recall or  awareness that staff has been assisting pt to bathroom for safety. Pt unable to recall walker precautions. SLP transferred pt to bathroom in [facility] and provided CGA for stand pivot to commode. Pt returned to w/c for remainder of session. SLP facilitated divergent naming task using functional categories with mod fading to min A semantic cues and additional time. Pt required min-to-mod A for basic problem solving with medications and determining daily therapy schedule. Patient was left in wheelchair with alarm activated and immediate needs within reach at end of session. Continue per current plan of care.       Pain Pain Assessment Pain Scale: 0-10 Pain Score: 0-No pain  Therapy/Group: Individual Therapy  [name] 03/14/2022, 9:18 AM

## 2022-03-14 NOTE — Progress Notes (Signed)
Physical Therapy Session Note  Patient Details  Name: Tamara Bullock MRN: [id number] Date of Birth: Aug 23, 1941  Today's Date: 03/14/2022 PT Individual Time: 0803-0900 AND 5038-8828 PT Individual Time Calculation (min): 57 min and 34 min  Short Term Goals: Week 1:  PT Short Term Goal 1 (Week 1): STG = LTG due to ELOS  Skilled Therapeutic Interventions/Progress Updates: Pt presented in bed agreeable to therapy. Pt denies pain. BP checked in supine without TED hose 132/51 (73) HR 64> PTA donned knee high TED hose and had pt performed x 20 ankle pumps prior to transferring to sitting. Pt transferred to sitting with supervision and use of bed features. BP checked 119/53 with mild symptoms. BP checked again ~3 min 125/52 (71) with symptoms resoled. Pt also performed LAQ and seated march x 10 bilaterally. T requesting to use bathroom prior to leaving room. Performed ambulatory transfer to toilet with RW and CGA with pt supervision for toilet transfers and LB clothing management (+bm/urinary void). Pt then ambulated to sink to perform hand hygiene with supervision and then sat in w/c. Pt transported to rehab gym for time management and energy conservation. In rehab gym performed ambulation around gym with RW and CGA nearing close supervision ~85Ft. Participated in ascending/descending x 4 steps with B rails with CGA. Pt was able to ascend with step to pattern and descend step through pattern. Pt then ambulated without AD to w/c ~41f with CGA. At w/c participated in toe taps to 4in step with HHA 2 x 10. Pt able to weight shift with minimal support and no LOB noted. Pt transported to 4W nsg station and ambulated to room without AD and CGA ~724f Pt agreeable to remain in w/c at end of session as SLP session to dovetail PT session. Pt left in w/c at end of session with belt alarm on, call bell within reach and needs met.   Tx2: Pt presented attempted OOB due to urinary urgency. PTA removed 4th rail, and turned  off alarm then ambulated pt HHA to toilet with pt intermittently reaching out for wall or sink. Pt was able to have continent void with supervision transfers, and supervision LB clothing management. Once completed pt ambulated to sink and performed hand hygiene with supervision. Pt then sat in w/c and transported to ortho gym to change w/c to allow pt to go outside. Once [facility] to uneven surfaces. Pt was able to ambulate ~10071fith CGA however pt indicated increased lightheadedness after ambulation. Once pt back in chair stated felt a little between and stated may have been from sun. As pt transported back to unit stated began to feel a little better with pt requesting to use bath room once returned to room. Pt performed ambulatory transfer to toilet with HHA and supervision for toileting same as above. HHA ambulation back to bed once completed with BP checked 141/61 (81) HR 60 with resolution of symptoms. Pt returned to bed with supervision and bed flat. PTA removed shoes and donned grip socks for time mangement. Pt left in bed at end of session with bed alarm on, call bell within reach and needs met.      Therapy Documentation Precautions:  Precautions Precautions: Fall Precaution Comments: Mild expressive aphasia Restrictions Weight Bearing Restrictions: No General:   Vital Signs: Therapy Vitals Temp: 97.7 F (36.5 C) Pulse Rate: 62 Resp: 15 BP: (!) 120/40 Patient Position (if appropriate): Lying Oxygen Therapy  SpO2: 98 % O2 Device: Room Air Pain:   Mobility:   Locomotion :    Trunk/Postural Assessment :    Balance:   Exercises:   Other Treatments:      Therapy/Group: Individual Therapy  [name] 03/14/2022, 4:32 PM

## 2022-03-14 NOTE — Progress Notes (Signed)
Occupational Therapy Session Note  Patient Details  Name: Tamara Bullock MRN: [id number] Date of Birth: Jan 14, 1941  Today's Date: 03/14/2022 OT Individual Time: 1104-1200 OT Individual Time Calculation (min): 56 min    Short Term Goals: Week 1:  OT Short Term Goal 1 (Week 1): Pt will perform sit to stand in preparation for ADL's and mobility with close S ad min cues OT Short Term Goal 2 (Week 1): Pt will complete UB bathing and dressing with set up and min cues OT Short Term Goal 3 (Week 1): Pt will complete LB bathing and dressing with close S after set up and min cues with intermittent standing with unilateral UE support OT Short Term Goal 4 (Week 1): Pt will perform toilet and shower bench transfers with close s and min cues using grab bar for support OT Short Term Goal 5 (Week 1): Pt will stand up to 5 minutes for simple item retrieval at kitchen counter with close S  Skilled Therapeutic Interventions/Progress Updates:  Pt greeted supine in bed reporting fatigue, but   agreeable to OT intervention. Session focus on BADL reeducation, functional mobility, dynamic standing balance and decreasing overall caregiver burden.  Pt completed supine>sit from flat HOB with CGA. Ambulatory toilet transfer with RW and gross supervision, MOD I for 3/3 toiletign tasks. + urine void,  Pt completed ambulatory shower transfer into walkin shower with CGA, pt doffed all clothes from shower seat MOD I. Pt completed bathing with ovearll MIN A as pt requested to have help with back and washing lower buttock for clealiness however feel pt could have completed bathign from shower seat with supervision. Pt requested to dress in bathroom; supervision to don [place] shirt and pants/underwear.pt ambulated out to EOB total A to don teds however pt able to don shoes with set- up assist from EOB. Pt then reports fatigue, requesting to lay down.  Pts daughter entered, therefore remainder of session focus on DC planning. Daughter  confirmed home set- up with new recommendation of TTB for home as daughter reports shower seat will not fit in walkin shower, pt agreeable to [facility] for PM toileting. Provided education on pts current level of assist at overall supervision for transfers with RW, daughter reports they are working out trying to set- up 24/7 care. Daughter observed pt complete ambulatory toilet transfer with Rw and gross supervision, CGA for 3/3 toileting tasks.  Pt left supine in bed with bed alarm activated and all needs within reach.                    Therapy Documentation Precautions:  Precautions Precautions: Fall Precaution Comments: Mild expressive aphasia Restrictions Weight Bearing Restrictions: No   Pain: no pain reported during session    Therapy/Group: Individual Therapy  Pollyann Glen [facility] 03/14/2022, 12:23 PM

## 2022-03-14 NOTE — Progress Notes (Signed)
PROGRESS NOTE   Subjective/Complaints:  Seen with SLP, no new issues, mood is bright   ROS: Patient denies CP, SOB, N/V/D    Objective:   No results found. Recent Labs    03/12/22 0602  WBC 6.2  HGB 11.6*  HCT 33.4*  PLT 282     Recent Labs    03/12/22 0602  NA 140  K 3.4*  CL 97*  CO2 30  GLUCOSE 105*  BUN 25*  CREATININE 1.15*  CALCIUM 9.5      Intake/Output Summary (Last 24 hours) at 03/14/2022 0919 Last data filed at 03/14/2022 0756 Gross per 24 hour  Intake 600 ml  Output --  Net 600 ml         Physical Exam: Vital Signs Blood pressure 135/71, pulse (!) 59, temperature 98.1 F (36.7 C), resp. rate 18, height 5' 3"  (1.6 m), weight 59.2 kg, SpO2 98 %.   General: No acute distress Mood and affect are appropriate Heart: Regular rate and rhythm no rubs murmurs or extra sounds Lungs: Clear to auscultation, breathing unlabored, no rales or wheezes Abdomen: Positive bowel sounds, soft nontender to palpation, nondistended Extremities: No clubbing, cyanosis, or edema  Skin: No evidence of breakdown, no evidence of rash Neurologic: Cranial nerves II through XII intact, motor strength is close to 5/5 in bilateral deltoid, bicep, tricep, grip, hip flexor, knee extensors, ankle dorsiflexor and plantar flexor. Mild right sided weakness. Good sitting balance Sensory exam normal for light touch and pain in all 4 limbs. No limb ataxia or cerebellar signs. No abnormal tone appreciated.   Musculoskeletal: Full range of motion in all 4 extremities. No joint swelling   Assessment/Plan: 1. Functional deficits which require 3+ hours per day of interdisciplinary therapy in a comprehensive inpatient rehab setting. Physiatrist is providing close team supervision and 24 hour management of active medical problems listed below. Physiatrist and rehab team continue to assess barriers to discharge/monitor patient  progress toward functional and medical goals  Care Tool:  Bathing    Body parts bathed by patient: Right arm, Left arm, Chest, Abdomen, Front perineal area, Right upper leg, Left upper leg, Face, Right lower leg, Left lower leg, Buttocks   Body parts bathed by helper: Right lower leg, Left lower leg     Bathing assist Assist Level: Contact Guard/Touching assist     Upper Body Dressing/Undressing Upper body dressing   What is the patient wearing?: Pull over shirt    Upper body assist Assist Level: Contact Guard/Touching assist    Lower Body Dressing/Undressing Lower body dressing      What is the patient wearing?: Underwear/pull up, Pants     Lower body assist Assist for lower body dressing: Contact Guard/Touching assist     Toileting Toileting    Toileting assist Assist for toileting: Supervision/Verbal cueing     Transfers Chair/bed transfer  Transfers assist     Chair/bed transfer assist level: Contact Guard/Touching assist     Locomotion Ambulation   Ambulation assist      Assist level: Contact Guard/Touching assist Assistive device: Walker-rolling Max distance: 75   Walk 10 feet activity   Assist     Assist  level: Contact Guard/Touching assist Assistive device: Walker-rolling   Walk 50 feet activity   Assist Walk 50 feet with 2 turns activity did not occur: Safety/medical concerns (fatigue, orthostatic)  Assist level: Contact Guard/Touching assist Assistive device: Walker-rolling    Walk 150 feet activity   Assist Walk 150 feet activity did not occur: Safety/medical concerns         Walk 10 feet on uneven surface  activity   Assist Walk 10 feet on uneven surfaces activity did not occur: Safety/medical concerns         Wheelchair     Assist Is the patient using a wheelchair?: No             Wheelchair 50 feet with 2 turns activity    Assist            Wheelchair 150 feet activity     Assist           Blood pressure 135/71, pulse (!) 59, temperature 98.1 F (36.7 C), resp. rate 18, height 5' 3"  (1.6 m), weight 59.2 kg, SpO2 98 %.  Medical Problem List and Plan: 1. Functional deficits secondary to large left basal ganglia infarct             -patient may shower             -ELOS/Goals: 7-10 days S            -Continue CIR therapies including PT, OT, and SLP , Team conference today please see physician documentation under team conference tab, met with team  to discuss problems,progress, and goals. Formulized individual treatment plan based on medical history, underlying problem and comorbidities.   -Grounds pass ordered 2.  Antithrombotics: -DVT/anticoagulation:  Pharmaceutical: Lovenox             -antiplatelet therapy: DAPT X 3 weeks followed by Plavix alone 3. Chronic low back pain: Tylenol prn for chronic LBP. Add kpad. 4. Mood: team/family providing egosupport -seeing her husband will help.              -antipsychotic agents: N/A 5. Neuropsych: This patient maybe capable of making decisions on her own behalf. 6. Skin/Wound Care: Routine pressure relief measures.  7. Fluids/Electrolytes/Nutrition: Monitor I/O.     Latest Ref Rng & Units 03/12/2022    6:02 AM 03/08/2022    5:51 AM 03/07/2022    5:35 AM  BMP  Glucose 70 - 99 mg/dL 105  109  104   BUN 8 - 23 mg/dL 25  26  18    Creatinine 0.44 - 1.00 mg/dL 1.15  1.34  1.19   Sodium 135 - 145 mmol/L 140  138  136   Potassium 3.5 - 5.1 mmol/L 3.4  3.8  3.5   Chloride 98 - 111 mmol/L 97  101  102   CO2 22 - 32 mmol/L 30  26  23    Calcium 8.9 - 10.3 mg/dL 9.5  9.3  9.2    Low K+ will supplement, recheck BMET  8. HTN: Monitor BP TID--continue Torsemide, Losartan and Coreg.  Vitals:   03/13/22 1920 03/14/22 0424  BP: 130/74 135/71  Pulse: 61 (!) 59  Resp: 18 18  Temp: 97.6 F (36.4 C) 98.1 F (36.7 C)  SpO2: 88% 91%  Mild systolic elevation 6/94, check ortho vitals  9.  HFrEF/New CM: No signs of overload.  Continue  Low salt diet. Strict I/O w/daily wts             -  monitor for signs of overload.   Ej fx 30-35%             --on Torsemide, Losartan, Coreg, ASA and atorvastatin.             --plans for GDMT in the future.  10. Hypothyroid: On levothyroxine] 11. H/o depression: Continue Sertraline and Mirtazapine- emotional lability improved vs yesterday              --ativan bid prn as at home.  -ego support by family/rehab team 12. CKD:  BUN/SCr- 28/1.14 @ admission-->improved with IVF--18/1.19 --Avoid nephrotoxic  medications 13. Pre-diabetes: Hgb A1C-6.0. Dietary education.   14.  Mild hypo K+due to torsemide, added KCL 33mq qd  LOS: 7 days A FACE TO FACE EVALUATION WAS PERFORMED  ACharlett Blake6/14/2023, 9:19 AM

## 2022-03-14 NOTE — Patient Care Conference (Signed)
Inpatient RehabilitationTeam Conference and Plan of Care Update Date: 03/14/2022   Time: 10:44 AM    Patient Name: Tamara Bullock      Medical Record Number: [id number]  Date of Birth: September 24, 1941 Sex: Female         Room/Bed: 4W08C/4W08C-01 Payor Info: Payor: [place] / Plan: BCBS MEDICARE / Product Type: *No Product type* /    Admit Date/Time:  03/07/2022  5:07 PM  Primary Diagnosis:  Basal ganglia stroke [facility])  Hospital Problems: Principal Problem:   Basal ganglia stroke ([place]) Active Problems:   Essential hypertension   Stage 3a chronic kidney disease (CKD) ([place])   Aphasia due to acute cerebrovascular accident (CVA) ([place])   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 (CVA)   Congestive heart failure [facility])    Expected Discharge Date: Expected Discharge Date: 03/17/22  Team Members Present: Physician leading conference: Dr. Alysia Penna Social Worker Present: Erlene Quan, BSW PT Present: Barrie Folk, PT OT Present: Willeen Cass, OT;Mary Jabier Gauss, COTA SLP Present: Sherren Kerns, SLP PPS Coordinator present : Gunnar Fusi, SLP     Current Status/Progress Goal Weekly Team Focus  Bowel/Bladder   Continent to bowel and bladder, LBM 6/13  remain continent      Swallow/Nutrition/ Hydration             ADL's   CGA- supervision for bathing and dressing from EOB, CGA for ADL transfers with RW, supervision for 3/3 toileting tasks, impaired [place] in RUE noted during higher level [place] tasks  S - MOD I  family training, dynamic balance, actiivty tolerance, BADL reeducation   Mobility   CGA supervsion assist transfers. CGA gait with RW up to 161ft.  modified independent transfers. supervsion assist gait with RW. CGA stairs with 1 hand rail  endurance. family education. transfers. gait. NMR for balance.   Communication   Min A word finding and mod-min A phrase level  Supervision A  edcuation, word finding strategies and speech  intelligibility strategies   Safety/Cognition/ Behavioral Observations  Min A  Supervision A  education, basic problem solving, recall and emergent awareness   Pain   Denies pain  denies pain      Skin   skin intact  skin to remain intact        Discharge Planning:  Patient discharging home with daughter and other children to assist. 24/7 supervision avaliable   Team Discussion: Patient with fluctuating BP (orthostasis) and fatigue post basal ganglia stroke. Progress limited by fatigue and BP; impaired fine motor movement in right hand, memory, intellectual awareness issues and receptive/comprehension deficits.  Patient on target to meet rehab goals: yes, currently needs CGA for bathing and dressing, supervision for toileting. Needs CGA for transfers and able to ambulate up to 70' with CGA using a RW. Needs min assist for word finding/communicating at a phrase level. NEeds mod assit for communication and min - mod assist for problem solving, memory and intellectual awareness with supervision goals overall, mod I for transfers and CGA for steps.  *See Care Plan and progress notes for long and short-term goals.   Revisions to Treatment Plan:  N/A   Teaching Needs: Safety, medications, secondary risk management, transfers, toileting, etc  Current Barriers to Discharge: Decreased caregiver support  Possible Resolutions to Barriers: Family education with daughters HH vs OP follow up services if transport available DME: BSC, TTB     Medical Summary Current Status: remains aphasic, affect brighter  Barriers to Discharge: Medical  stability;Other (comments)  Barriers to Discharge Comments: communication Possible Resolutions to [employer] Focus: Husband in hospital , daughters will be  caregivers   Continued Need for Acute Rehabilitation Level of Care: The patient requires daily medical management by a physician with specialized training in physical medicine and rehabilitation for  the following reasons: Direction of a multidisciplinary physical rehabilitation program to maximize functional independence : Yes Medical management of patient stability for increased activity during participation in an intensive rehabilitation regime.: Yes Analysis of laboratory values and/or radiology reports with any subsequent need for medication adjustment and/or medical intervention. : Yes   I attest that I was present, lead the team conference, and concur with the assessment and plan of the team.   Dorien Chihuahua B 03/14/2022, 4:07 PM

## 2022-03-15 LAB — BASIC METABOLIC PANEL
Anion gap: 11 (ref 5–15)
BUN: 26 mg/dL — ABNORMAL HIGH (ref 8–23)
CO2: 28 mmol/L (ref 22–32)
Calcium: 9.9 mg/dL (ref 8.9–10.3)
Chloride: 98 mmol/L (ref 98–111)
Creatinine, Ser: 1.21 mg/dL — ABNORMAL HIGH (ref 0.44–1.00)
GFR, Estimated: 45 mL/min — ABNORMAL LOW (ref >60)
Glucose, Bld: 118 mg/dL — ABNORMAL HIGH (ref 70–99)
Potassium: 3.6 mmol/L (ref 3.5–5.1)
Sodium: 137 mmol/L (ref 135–145)

## 2022-03-15 MED ORDER — TORSEMIDE 20 MG PO TABS
40.0000 mg | ORAL_TABLET | Freq: Every day | ORAL | 0 refills | Status: DC
  Filled 2022-03-15: qty 60, 30d supply, fill #0

## 2022-03-15 MED ORDER — MIRTAZAPINE 7.5 MG PO TABS
7.5000 mg | ORAL_TABLET | Freq: Every day | ORAL | 0 refills | Status: DC
  Filled 2022-03-15: qty 30, 30d supply, fill #0

## 2022-03-15 MED ORDER — SENNOSIDES-DOCUSATE SODIUM 8.6-50 MG PO TABS
1.0000 | ORAL_TABLET | Freq: Every day | ORAL | 0 refills | Status: DC
  Filled 2022-03-15: qty 30, 30d supply, fill #0

## 2022-03-15 MED ORDER — CLOPIDOGREL BISULFATE 75 MG PO TABS
75.0000 mg | ORAL_TABLET | Freq: Every day | ORAL | 0 refills | Status: DC
  Filled 2022-03-15: qty 8, 8d supply, fill #0

## 2022-03-15 MED ORDER — CARVEDILOL 3.125 MG PO TABS
3.1250 mg | ORAL_TABLET | Freq: Two times a day (BID) | ORAL | 0 refills | Status: DC
  Filled 2022-03-15: qty 60, 30d supply, fill #0

## 2022-03-15 MED ORDER — ASPIRIN 81 MG PO TBEC
81.0000 mg | DELAYED_RELEASE_TABLET | Freq: Every day | ORAL | 0 refills | Status: DC
  Filled 2022-03-15: qty 90, 90d supply, fill #0

## 2022-03-15 MED ORDER — POTASSIUM CHLORIDE CRYS ER 10 MEQ PO TBCR
10.0000 meq | EXTENDED_RELEASE_TABLET | Freq: Every day | ORAL | 0 refills | Status: DC
  Filled 2022-03-15: qty 30, 30d supply, fill #0

## 2022-03-15 MED ORDER — LOSARTAN POTASSIUM 25 MG PO TABS
12.5000 mg | ORAL_TABLET | Freq: Every day | ORAL | 0 refills | Status: DC
  Filled 2022-03-15: qty 30, 60d supply, fill #0

## 2022-03-15 MED ORDER — ATORVASTATIN CALCIUM 80 MG PO TABS
80.0000 mg | ORAL_TABLET | Freq: Every day | ORAL | 0 refills | Status: AC
  Filled 2022-03-15: qty 30, 30d supply, fill #0

## 2022-03-15 MED ORDER — SERTRALINE HCL 25 MG PO TABS
25.0000 mg | ORAL_TABLET | Freq: Every day | ORAL | 0 refills | Status: DC
  Filled 2022-03-15: qty 30, 30d supply, fill #0

## 2022-03-15 MED ORDER — ATORVASTATIN CALCIUM 80 MG PO TABS
80.0000 mg | ORAL_TABLET | Freq: Every day | ORAL | Status: DC
  Administered 2022-03-16: 80 mg via ORAL
  Filled 2022-03-15: qty 1

## 2022-03-15 NOTE — Progress Notes (Addendum)
PROGRESS NOTE   Subjective/Complaints:  Asked Nsg for toileting and made it "in time" , aware of d/c date   ROS: Patient denies CP, SOB, N/V/D    Objective:   No results found. No results for input(s): "WBC", "HGB", "HCT", "PLT" in the last 72 hours.   No results for input(s): "NA", "K", "CL", "CO2", "GLUCOSE", "BUN", "CREATININE", "CALCIUM" in the last 72 hours.    Intake/Output Summary (Last 24 hours) at 03/15/2022 0836 Last data filed at 03/15/2022 0754 Gross per 24 hour  Intake 757 ml  Output --  Net 757 ml         Physical Exam: Vital Signs Blood pressure (!) 142/57, pulse 61, temperature 97.8 F (36.6 C), resp. rate 18, height 5\' 3"  (1.6 m), weight 62.3 kg, SpO2 100 %.   General: No acute distress Mood and affect are appropriate Heart: Regular rate and rhythm no rubs murmurs or extra sounds Lungs: Clear to auscultation, breathing unlabored, no rales or wheezes Abdomen: Positive bowel sounds, soft nontender to palpation, nondistended Extremities: No clubbing, cyanosis, or edema  Skin: No evidence of breakdown, no evidence of rash Neurologic: Cranial nerves II through XII intact, motor strength is close to 5/5 in bilateral deltoid, bicep, tricep, grip, hip flexor, knee extensors, ankle dorsiflexor and plantar flexor. Mild right sided weakness. Good sitting balance  No abnormal tone appreciated.   Musculoskeletal: Full range of motion in all 4 extremities. No joint swelling   Assessment/Plan: 1. Functional deficits which require 3+ hours per day of interdisciplinary therapy in a comprehensive inpatient rehab setting. Physiatrist is providing close team supervision and 24 hour management of active medical problems listed below. Physiatrist and rehab team continue to assess barriers to discharge/monitor patient progress toward functional and medical goals  Care Tool:  Bathing    Body parts bathed by  patient: Right arm, Left arm, Chest, Abdomen, Front perineal area, Right upper leg, Left upper leg, Face, Right lower leg, Left lower leg   Body parts bathed by helper: Buttocks     Bathing assist Assist Level: Minimal Assistance - Patient > 75%     Upper Body Dressing/Undressing Upper body dressing   What is the patient wearing?: Pull over shirt    Upper body assist Assist Level: Supervision/Verbal cueing    Lower Body Dressing/Undressing Lower body dressing      What is the patient wearing?: Underwear/pull up, Pants     Lower body assist Assist for lower body dressing: Contact Guard/Touching assist     Toileting Toileting    Toileting assist Assist for toileting: Contact Guard/Touching assist     Transfers Chair/bed transfer  Transfers assist     Chair/bed transfer assist level: Contact Guard/Touching assist     Locomotion Ambulation   Ambulation assist      Assist level: Contact Guard/Touching assist Assistive device: Walker-rolling Max distance: 75   Walk 10 feet activity   Assist     Assist level: Contact Guard/Touching assist Assistive device: Walker-rolling   Walk 50 feet activity   Assist Walk 50 feet with 2 turns activity did not occur: Safety/medical concerns (fatigue, orthostatic)  Assist level: Contact Guard/Touching assist Assistive device: Walker-rolling  Walk 150 feet activity   Assist Walk 150 feet activity did not occur: Safety/medical concerns         Walk 10 feet on uneven surface  activity   Assist Walk 10 feet on uneven surfaces activity did not occur: Safety/medical concerns         Wheelchair     Assist Is the patient using a wheelchair?: No             Wheelchair 50 feet with 2 turns activity    Assist            Wheelchair 150 feet activity     Assist          Blood pressure (!) 142/57, pulse 61, temperature 97.8 F (36.6 C), resp. rate 18, height 5\' 3"  (1.6 m),  weight 62.3 kg, SpO2 100 %.  Medical Problem List and Plan: 1. 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  Antithrombotics: -DVT/anticoagulation:  Pharmaceutical: Lovenox             -antiplatelet therapy: DAPT X 3 weeks followed by Plavix alone 3. Chronic low back pain: Tylenol prn for chronic LBP. Add kpad. 4. Mood: team/family providing egosupport -seeing her husband will help.              -antipsychotic agents: N/A 5. Neuropsych: This patient maybe capable of making decisions on her own behalf. 6. Skin/Wound Care: Routine pressure relief measures.  7. Fluids/Electrolytes/Nutrition: Monitor I/O.     Latest Ref Rng & Units 03/12/2022    6:02 AM 03/08/2022    5:51 AM 03/07/2022    5:35 AM  BMP  Glucose 70 - 99 mg/dL 05/07/2022  419  379   BUN 8 - 23 mg/dL 25  26  18    Creatinine 0.44 - 1.00 mg/dL 024   0.97   Sodium 135 - 145 mmol/L 140  138  136   Potassium 3.5 - 5.1 mmol/L 3.4  3.8  3.5   Chloride 98 - 111 mmol/L 97  101  102   CO2 22 - 32 mmol/L 30  26  23    Calcium 8.9 - 10.3 mg/dL 9.5  9.3  9.2    Low K+ will supplement, recheck BMET  8. HTN: Monitor BP TID--continue Torsemide, Losartan and Coreg.  Vitals:   03/14/22 2004 03/15/22 0534  BP: (!) 137/55 (!) 142/57  Pulse: 61 61  Resp: 16 18  Temp: 97.6 F (36.4 C) 97.8 F (36.6 C)  SpO2: 99% 100%  Mild systolic elevation 6/15, check ortho vitals  9.  HFrEF/New CM: No signs of overload.  Continue Low salt diet. Strict I/O w/daily wts             -monitor for signs of overload.   Ej fx 30-35%             --on Torsemide, Losartan, Coreg, ASA and atorvastatin.             --plans for GDMT in the future.  10. Hypothyroid: On levothyroxine] 11. H/o depression: Continue Sertraline and Mirtazapine- emotional lability improved vs yesterday               --  ativan bid prn as at home.  -ego support by family/rehab team 12. CKD:  BUN/SCr- 28/1.14 @ admission-->improved with IVF--18/1.19 --Avoid nephrotoxic  medications 13. Pre-diabetes: Hgb A1C-6.0. Dietary education.   14.  Mild hypo K+due to torsemide, added KCL 78meq qd- recheck 6/15  LOS: 8 days A FACE TO [place] Tamara Bullock 03/15/2022, 8:36 AM

## 2022-03-15 NOTE — Progress Notes (Signed)
Occupational Therapy Session Note  Patient Details  Name: Tamara Bullock MRN: [id number] Date of Birth: 22-Mar-1941  Today's Date: 03/15/2022 OT Individual Time: 1045-1200 session 1 OT Individual Time Calculation (min): 75 min  Session 2: 1305-1400  Short Term Goals: Week 1:  OT Short Term Goal 1 (Week 1): Pt will perform sit to stand in preparation for ADL's and mobility with close S ad min cues OT Short Term Goal 2 (Week 1): Pt will complete UB bathing and dressing with set up and min cues OT Short Term Goal 3 (Week 1): Pt will complete LB bathing and dressing with close S after set up and min cues with intermittent standing with unilateral UE support OT Short Term Goal 4 (Week 1): Pt will perform toilet and shower bench transfers with close s and min cues using grab bar for support OT Short Term Goal 5 (Week 1): Pt will stand up to 5 minutes for simple item retrieval at kitchen counter with close S  Skilled Therapeutic Interventions/Progress Updates:  Session 1: Pt greeted seated in w/c  agreeable to OT intervention. Session focus on IADLs, functional mobility, dynamic standing balance and decreasing overall caregiver burden.   Pt transported to apt with total A for time mgmt. Pt completed ambulatory shower transfer to TTB with gross supervision. Pt also completed ambulatory transfers with RW to flat [facility] and recliner with supervision.  Pt completed dynamic balance task in kitchen with RW with supervision- CGA where pt instructed to gather items from various heights such as OH and below knee level with CGA. Pt perseverating on not being able to remember her exact kitchen set- up at home. Education provided on general fall prevention education for kitchen tasks such as using RW bag, decreasing clutter and removing rugs. Pt verbalized understanding of education. Pt transported to day room where pt worked on seated IADL task of organizing pills in pillbox. Pt needed MAX instructional and   demonstrational cues  to complete task. No RUE FMC deficits noted during task. Pt completed this task in 20 mins, recommended that her daughters assist her with task at home. Pt completed functional ambulation with Rw back to room with supervision and completed 3/3 toileting tasks with supervision as well, pt left seated in w/c with alarm activated and all needs within reach.                      Session 2: Pt greeted supine in bed noted to be tearful and reports feeling anxious. Provided emotional support and assisted pt with 2 mins of deep breathing to decrease anxiety/ stress. Pt much more relaxed after deep breathing and pt agreeable to OT intervention.  Pt completed completed supine>sit MODI and abel to don socks and shoes MOD I. Pt completed stand pivot transfer from EOB>w/c with Rw and supervision. Total A transport to gym for time mgmt.  Pt completed seated FMC tasks such as dealing cards and completing small puzzle with RUE, minor deficits noted in R hand but only when manipulating very small pieces, no issues noted with in hand manipulation skills such as rotation, translation and shifting. Utilized BITS to challenge dynamic standing balance, RUE coordination, sequencing, attention and word finding. Pt first instructed to sequence through letters on screen by tapping words on screen with RUE from A-Z:                RUE sequencing letters: 35% accuracy, 11.9 sec reaction, difficulty with word finding and mixing  up letters that look similar such as "M" "N" or "O" and "Q"  Next, pt completed a sequencing words task where words were called out and pt instructed to select the correct words from choices on the screen, results indicated below: RUE sequencing words 71% accuracy 10.35 sec reaction time 6 misses.  Pt completed functional ambulation back to room with pt instructed to retrieve various items in hallway placed at hip height to challenge dynamic gait balance using RW to transport items. Pt  completed task with gross supervision. Once back in room pt completed 3/3 toileting tasks MODI. Pt left supine in bed with bed alarm activated and all needs within reach.   Therapy Documentation Precautions:  Precautions Precautions: Fall Precaution Comments: Mild expressive aphasia Restrictions Weight Bearing Restrictions: No  Pain: Session 1: no pain reported during session  Session 2: no pain reported    Therapy/Group: Individual Therapy  Pollyann Glen [facility] 03/15/2022, 12:21 PM

## 2022-03-15 NOTE — Progress Notes (Signed)
Patient ID: Tamara Bullock, female   DOB: 1940/11/25, 81 y.o.   MRN: [id number]  Bedside Commode and Shower Bench ordered through [employer]

## 2022-03-15 NOTE — Progress Notes (Signed)
Physical Therapy Session Note  Patient Details  Name: Tamara Bullock MRN: [id number] Date of Birth: 12-09-1940  Today's Date: 03/15/2022 PT Individual Time: 0909-1007 PT Individual Time Calculation (min): 58 min   Short Term Goals: Week 1:  PT Short Term Goal 1 (Week 1): STG = LTG due to ELOS  Skilled Therapeutic Interventions/Progress Updates: Pt presented in bed agreeable to therapy. Pt denies pain during session. PTA donned TED hose total A for time management. Lab tech arrived for blood draw however unsuccessful with tech stating will return after therapy session. BP checked prior to OOB 129/51 (73). Pt with no s/s hypotension upon sitting with pt standing and ambulating to day room with RW and CGA. Pt continued to be asymptomatic therefore BP not assessed after ambulation. Pt participated in obstacle course without AD including weaving through cones, stepping over thresholds, stepping onto 4in step, and standing on Airex ~34f x 2 without AD and CGA. On first step up pt did require HHA however was able to complete with just CGA on second attempt. Pt also participated in several bouts of horseshoes while standing on Airex performing reaching tasks with RUE. Pt did require intermittent cues for sequencing as pt would attempt to throw from spot where horseshoe was grabbed (high/cross midline/etc). Pt with decreased ankle strategy noted and delayed righting reaction when noted increased posterior lean with PTA assisting correction. After seated rest pt ambulated back to room without AD and CGA. Pt noted to ambulate with more narrow BOS and decreased B foot clearance which she was able to correct with cues but poor carryover after a few steps. In room pt agreeable to remain in w/c until next session. Pt left in w/c at end of session with belt alarm on, call bell within reach and needs met.      Therapy Documentation Precautions:  Precautions Precautions: Fall Precaution Comments: Mild expressive  aphasia Restrictions Weight Bearing Restrictions: No General:   Vital Signs:  Pain: Pain Assessment Pain Scale: 0-10 Pain Score: 0-No pain Mobility:   Locomotion :    Trunk/Postural Assessment :    Balance:   Exercises:   Other Treatments:      Therapy/Group: Individual Therapy  Cleola Perryman 03/15/2022, 12:34 PM

## 2022-03-15 NOTE — Progress Notes (Signed)
Patient ID: Tamara Bullock, female   DOB: September 13, 1941, 81 y.o.   MRN: [id number] Team Conference Report to Patient/Family  Team Conference discussion was reviewed with the patient and caregiver, including goals, any changes in plan of care and target discharge date.  Patient and caregiver express understanding and are in agreement.  The patient has a target discharge date of 03/17/22.  SW spoke with patient daughter, Darreld Mclean and patient and provided team conference updates. Patient daughter pleased with patient progress and has agreed to attend family education on Friday 9-12. Patient children will rotate assistance at home with patient and her spouse once both are discharged.   [name] 03/15/2022, 1:00 PM

## 2022-03-15 NOTE — Progress Notes (Addendum)
Speech Language Pathology Daily Session Note  Patient Details  Name: Tamara Bullock MRN: [id number] Date of Birth: 1941-05-31  Today's Date: 03/15/2022 SLP Individual Time: 1400-1500 SLP Individual Time Calculation (min): 60 min  Short Term Goals: Week 1: SLP Short Term Goal 1 (Week 1): Pt will demonstrate speech intelligibility at phrase/simple sentence level with 70% accuracy given supervision A over articulation and increase vocal intensity cues. SLP Short Term Goal 2 (Week 1): Pt will express wants/needs at phrase/simple sentence level with supervision A for word finding strategies. SLP Short Term Goal 3 (Week 1): Pt will demonstrate basic problem solving skills with supervision A verbal cues. SLP Short Term Goal 4 (Week 1): Pt will demonstrate recall of daily, novel information with supervision A for external aids. SLP Short Term Goal 5 (Week 1): Pt will self-monitor and self-correct verbal and functional errors with supervision A verbal cues. SLP Short Term Goal 6 (Week 1): Pt will participate in continued languaged assessment.  Skilled Therapeutic Interventions: Skilled ST treatment focused on communication goals. Pt exhibiting some anxiety regarding leaving her room for therapy however eventually agreeable with further time to process and verbal support. SLP facilitated session by providing sup A verbal cues for higher level word finding tasks including homonyms and convergent naming. Pt formulated sentences given target word with sup A verbal cues for word finding. SLP provided education and facilitated word finding strategies at the conversation level with overall min A verbal cues for use of semantic feature analysis. Pt exhibited increased word finding difficulty in unstructured contexts as compared to structured/controlled setting. Daughter present at end of session and supported fluctuations in pt's verbal fluency with anxiety. Patient was left in bed with alarm activated and immediate  needs within reach at end of session. Continue per current plan of care.      Pain  None/denied  Therapy/Group: Individual Therapy  Tamara Bullock 03/15/2022, 2:11 PM

## 2022-03-16 ENCOUNTER — Other Ambulatory Visit (HOSPITAL_COMMUNITY): Payer: Self-pay

## 2022-03-16 NOTE — Progress Notes (Signed)
Physical Therapy Session Note  Patient Details  Name: Tamara Bullock MRN: [id number] Date of Birth: 1941-08-28  Today's Date: 03/16/2022 PT Individual Time: 1015-1115 PT Individual Time Calculation (min): 60 min   Short Term Goals: Week 1:  PT Short Term Goal 1 (Week 1): STG = LTG due to ELOS  Skilled Therapeutic Interventions/Progress Updates: Pt presented in w/c with dgt present agreeable to therapy. Pt denies pain at rest but c/o fatigue. Rest break provided as needed during session. Session to focus on functional mobility and family education in preparation for d/c. PTA provided update in pt's current status and that she has achieved goal level (supervision). Provided education that pt has been using TED hose but that BP has been more stable past few days. Provided edu on taking time getting out of bed as well as performed ankle pumps and LAQ prior to OOB as has helped maintained BP during therapy sessions. Pt transported to ortho gym and participated in functional activities including car transfer and ambulation on ramp and mulch. All activities performed at [facility] to supervision level at noted in care tool. Pt then transported to ADL apt and pt was able to transfer to bed and perform all bed mobility at independent level. Pt was able to ambulate around bed to sofa and perform furniture transfer at supervision level with verbal cues only to push up from arm rest. Pt transported to rehab gym and pt was able to ascend/descend x 8 steps with B rails, pt did c/o SOB after 8 steps with SpO2 >95% and HR mid 90s. Per pt resolved with seated rest. PTA then provided edu to dgt regarding recommendation of using RW at d/c with PTA ambulating with pt ~26f with CGA without AD. PTA then providing RW and had pt ambulate additional 1540fwith supervision. Dgt was able to see difference with pt using RW and acknowledged recommendation. In room pt returned to w/c at end of session and left with call bell within reach  and needs met. Dgt felt comfortable with all mobility tasks pt performed in preparation for d/c.      Therapy Documentation Precautions:  Precautions Precautions: Fall Precaution Comments: Mild expressive aphasia Restrictions Weight Bearing Restrictions: No General:   Vital Signs: Therapy Vitals Temp: 97.6 F (36.4 C) Pulse Rate: (!) 56 Resp: 16 BP: (!) 122/40 Patient Position (if appropriate): Lying Oxygen Therapy SpO2: 97 % O2 Device: Room Air Pain:   Mobility: Bed Mobility Bed Mobility: Rolling Right;Rolling Left;Left Sidelying to Sit;Sit to Sidelying Left Rolling Right: Independent Rolling Left: Independent Left Sidelying to Sit: Independent with assistive device Sit to Sidelying Left: Independent with assistive device Transfers Transfers: Sit to Stand;Stand to Sit;Stand Pivot Transfers Sit to Stand: Independent with assistive device Stand to Sit: Independent with assistive device Stand Pivot Transfers: Independent with assistive device Transfer (Assistive device): Rolling walker Locomotion :    Trunk/Postural Assessment : Cervical Assessment Cervical Assessment: Within Functional Limits Thoracic Assessment Thoracic Assessment: Within Functional Limits Lumbar Assessment Lumbar Assessment: Within Functional Limits Postural Control Postural Control: Within Functional Limits  Balance: Static Sitting Balance Static Sitting - Balance Support: Feet supported Static Sitting - Level of Assistance: 6: Modified independent (Device/Increase time) Dynamic Sitting Balance Dynamic Sitting - Balance Support: Feet supported Dynamic Sitting - Level of Assistance: 5: Stand by assistance Static Standing Balance Static Standing - Balance Support: Bilateral upper extremity supported Static Standing - Level of Assistance: 5: Stand by assistance Dynamic Standing Balance Dynamic Standing - Balance Support: Bilateral upper  extremity supported Dynamic Standing - Level of  Assistance: 5: Stand by assistance Exercises:   Other Treatments:      Therapy/Group: Individual Therapy  Alondria Mousseau 03/16/2022, 4:12 PM

## 2022-03-16 NOTE — Progress Notes (Signed)
Patient ID: Tamara Bullock, female   DOB: 1941/01/13, 81 y.o.   MRN: [id number]  Rolling Walker ordered through [employer]

## 2022-03-16 NOTE — Progress Notes (Signed)
Inpatient Rehabilitation Discharge Medication Review by a Pharmacist  A complete drug regimen review was completed for this patient to identify any potential clinically significant medication issues.  High Risk Drug Classes Is patient taking? Indication by Medication  Antipsychotic No   Anticoagulant No   Antibiotic No   Opioid No   Antiplatelet Yes Aspirin, Clopidogrel: CVA prophylaxis   Hypoglycemics/insulin No   Vasoactive Medication Yes Losartan: HFrEF Coreg: BP; HFrEF Torsemide: HFrEF  Chemotherapy No   Other Yes Lorazepam depression Lipitor- HLD Mirtazepine sleep Euthyrox:Hypothyroidism  Sertraline depression     Type of Medication Issue Identified Description of Issue Recommendation(s)  Drug Interaction(s) (clinically significant)     Duplicate Therapy     Allergy     No Medication Administration End Date  aspirin Aspirin x21 days then plavix alone for lifelong therapy per neurology Aspirin end date confirmed as 03/23/2022. Family is aware  Incorrect Dose     Additional Drug Therapy Needed     Significant med changes from prior encounter (inform family/care partners about these prior to discharge).    Other       Clinically significant medication issues were identified that warrant physician communication and completion of prescribed/recommended actions by midnight of the next day:  No   Time spent performing this drug regimen review (minutes):  30   Deryl Giroux BS, PharmD, BCPS Clinical Pharmacist 03/16/2022 2:48 PM  Contact: [phone] after 3 PM  "Be curious, not judgmental..." -Debbora Dus

## 2022-03-16 NOTE — Progress Notes (Signed)
Occupational Therapy Discharge Summary  Patient Details  Name: Tamara Bullock MRN: [id number] Date of Birth: 12/19/1940  Today's Date: 03/16/2022  Patient has met 12 of 12 long term goals due to {due LI:1030131}. Pt has made excellent progress during her LOS d/t improvements in balance, functional mobility, RUE strength/coordination, endurance and overall activity tolerance. Pt is able to complete dressing MOD I, bathing from shower level with supervision, toileting tasks with MODI and transfers with RW with supervision- MODI. Pts RUE [place] has improved since eval as pt needed 2 mins to complete 9 HPT and now able to complete test on R hand in 1 min. Pts daughter was present for family ed and demo'ed competency with assisting pt home.   Patient to discharge at overall {LOA:3049010} level.  Patient's care partner {care partner:3041650} to provide the necessary {assistance:3041652} assistance at discharge.    Reasons goals not met: NA  Recommendation:  Patient will benefit from ongoing skilled OT services in {setting:3041680} to continue to advance functional skills in the area of {ADL/iADL:3041649}.  Equipment: BSC and TTB  Reasons for discharge: {Reason for discharge:3049018}  Patient/family agrees with progress made and goals achieved: {Pt/Family agree with progress/goals:3049020}  OT Discharge  ADL ADL Eating: Independent Where Assessed-Eating: Edge of bed Grooming: Modified independent Where Assessed-Grooming: Sitting at sink, Standing at sink Upper Body Bathing: Supervision/safety Where Assessed-Upper Body Bathing: Shower Lower Body Bathing: Supervision/safety Where Assessed-Lower Body Bathing: Shower Upper Body Dressing: Modified independent (Device) Where Assessed-Upper Body Dressing: Wheelchair Lower Body Dressing: Modified independent Where Assessed-Lower Body Dressing: Wheelchair Toileting: Modified independent Where Assessed-Toileting: Glass blower/designer: Midwife Method: Ambulating (RW) Science writer: Energy manager: Distant supervision Tub/Shower Transfer Method: Optometrist: Facilities manager: Distant supervision Social research officer, government Method: Heritage manager: Civil engineer, contracting with back ADL Comments: overall Supervision- MOD I for [employer] and functional mobility with RW Vision Baseline Vision/History: 1 Wears glasses (readers) Patient Visual Report: No change from baseline (seems to have baseline photophobia) Vision Assessment?: No apparent visual deficits Perception  Perception: Within Functional Limits Praxis Praxis: Intact Praxis Impairment Details: Motor planning (mild) Cognition Cognition Overall Cognitive Status: Impaired/Different from baseline Arousal/Alertness: Awake/alert Memory: Impaired Memory Impairment: Decreased short term memory Decreased Short Term Memory: Functional complex Attention: Sustained;Selective Focused Attention: Appears intact Focused Attention Impairment: Functional complex Sustained Attention: Appears intact Selective Attention: Impaired Selective Attention Impairment: Verbal complex;Functional complex Awareness: Impaired Awareness Impairment: Intellectual impairment;Emergent impairment Problem Solving: Impaired Problem Solving Impairment: Functional complex;Verbal complex Executive Function: Decision Making Decision Making: Impaired Decision Making Impairment: Functional complex Safety/Judgment: Impaired Brief Interview for Mental Status (BIMS) Repetition of Three Words (First Attempt): 3 Temporal Orientation: Year: Correct Temporal Orientation: Month: Missed by more than 1 month Temporal Orientation: Day: Correct Recall: "Sock": Yes, no cue required Recall: "Blue": Yes, no cue required Recall: "Bed": No, could not recall BIMS Summary Score: 11 Sensation Sensation Light Touch:  Appears Intact Hot/Cold: Appears Intact Proprioception: Appears Intact Stereognosis: Not tested Coordination Gross Motor Movements are Fluid and Coordinated: Yes Fine Motor Movements are Fluid and Coordinated: No 9 Hole Peg Test: LUE- 32 secs RUE- 1 min 2 secs Motor  Motor Motor: Hemiplegia Motor - Discharge Observations: Mild R hemiplegia improved from eval Mobility  Bed Mobility Bed Mobility: Rolling Right;Rolling Left;Left Sidelying to Sit;Sit to Sidelying Left Rolling Right: Independent Rolling Left: Independent Left Sidelying to Sit: Independent with assistive device Sit to Sidelying Left: Independent with assistive device Transfers Sit  to Stand: Independent with assistive device Stand to Sit: Independent with assistive device  Trunk/Postural Assessment  Cervical Assessment Cervical Assessment: Within Functional Limits Thoracic Assessment Thoracic Assessment: Within Functional Limits Lumbar Assessment Lumbar Assessment: Within Functional Limits Postural Control Postural Control: Within Functional Limits  Balance Static Sitting Balance Static Sitting - Balance Support: Feet supported Static Sitting - Level of Assistance: 6: Modified independent (Device/Increase time) Dynamic Sitting Balance Dynamic Sitting - Balance Support: Feet supported Dynamic Sitting - Level of Assistance: 5: Stand by assistance Static Standing Balance Static Standing - Balance Support: Bilateral upper extremity supported Static Standing - Level of Assistance: 5: Stand by assistance Dynamic Standing Balance Dynamic Standing - Balance Support: Bilateral upper extremity supported Dynamic Standing - Level of Assistance: 5: Stand by assistance Extremity/Trunk Assessment RUE Assessment RUE Assessment: Within Functional Limits Passive Range of Motion (PROM) Comments: WFL Active Range of Motion (AROM) Comments: WFL General Strength Comments: mmt grossly 4/5 LUE Assessment LUE Assessment: Within  Functional Limits   Precious Haws 03/16/2022, 7:57 AM

## 2022-03-16 NOTE — Progress Notes (Signed)
Pt very anxious and tearful this morning. Pt also c/o bilateral foot pain, increase pain when touching. Pt unable to describe the pain and refusing medication. Provider will be notified.

## 2022-03-16 NOTE — Progress Notes (Signed)
Physical Therapy Discharge Summary  Patient Details  Name: Tamara Bullock MRN: [id number] Date of Birth: 12/04/1940  Today's Date: 03/16/2022    Patient has met 9 of 9 long term goals due to improved activity tolerance, improved balance, improved postural control, increased strength, improved attention, improved awareness, and improved coordination.  Patient to discharge at an ambulatory level Supervision.   Patient's care partner is independent to provide the necessary physical assistance at discharge.  Reasons goals not met: N/A all goals met  Recommendation:  Patient will benefit from ongoing skilled PT services in home health setting to continue to advance safe functional mobility, address ongoing impairments in functional mobility, gait training, general strengthening and conditioning, caregiver training, home safety, and minimize fall risk.  Equipment: Conservation officer, nature  Reasons for discharge: treatment goals met and discharge from hospital  Patient/family agrees with progress made and goals achieved: Yes  PT Discharge Precautions/Restrictions Precautions Precautions: Fall Precaution Comments: Mild expressive aphasia Restrictions Weight Bearing Restrictions: No Vital Signs  Pain   Pain Interference Pain Interference Pain Effect on Sleep: 1. Rarely or not at all Pain Interference with Therapy Activities: 1. Rarely or not at all Pain Interference with Day-to-Day Activities: 1. Rarely or not at all Vision/Perception  Vision - History Ability to See in Adequate Light: 0 Adequate Perception Perception: Within Functional Limits Praxis Praxis: Intact Praxis Impairment Details: Motor planning (mild)  Cognition Overall Cognitive Status: Impaired/Different from baseline Arousal/Alertness: Awake/alert Orientation Level: Oriented X4 Year: 2023 Month: June Day of Week: Correct Attention: Sustained;Selective Focused Attention: Appears intact Focused Attention Impairment:  Functional complex Sustained Attention: Appears intact Selective Attention: Impaired Selective Attention Impairment: Verbal complex;Functional complex Memory: Impaired Memory Impairment: Decreased short term memory Decreased Short Term Memory: Functional complex Awareness: Impaired Awareness Impairment: Intellectual impairment;Emergent impairment Problem Solving: Impaired Problem Solving Impairment: Functional complex;Verbal complex Executive Function: Decision Making Decision Making: Impaired Decision Making Impairment: Functional complex Safety/Judgment: Impaired Sensation Sensation Light Touch: Appears Intact Hot/Cold: Appears Intact Proprioception: Appears Intact Stereognosis: Not tested Coordination Gross Motor Movements are Fluid and Coordinated: Yes Fine Motor Movements are Fluid and Coordinated: No 9 Hole Peg Test: LUE- 32 secs RUE- 1 min 2 secs Motor  Motor Motor: Hemiplegia Motor - Discharge Observations: Mild R hemiplegia improved from eval  Mobility Bed Mobility Bed Mobility: Rolling Right;Rolling Left;Left Sidelying to Sit;Sit to Sidelying Left Rolling Right: Independent Rolling Left: Independent Left Sidelying to Sit: Independent with assistive device Sit to Sidelying Left: Independent with assistive device Transfers Transfers: Sit to Stand;Stand to Sit;Stand Pivot Transfers Sit to Stand: Independent with assistive device Stand to Sit: Independent with assistive device Stand Pivot Transfers: Independent with assistive device Stand Pivot Transfer Details: Verbal cues for technique Transfer (Assistive device): Rolling walker Locomotion  Gait Ambulation: Yes Gait Assistance: Supervision/Verbal cueing Assistive device: Rolling walker Gait Gait: Yes Gait Pattern: Impaired Gait Pattern: Step-through pattern;Decreased stride length;Narrow base of support Gait velocity: decreased Stairs / Additional Locomotion Stairs: Yes Stairs Assistance: Contact  Guard/Touching assist Stair Management Technique: Two rails Number of Stairs: 8 Height of Stairs: 6 Ramp: Contact Guard/touching assist Pick up small object from the floor assist level: Supervision/Verbal cueing Wheelchair Mobility Wheelchair Mobility: No  Trunk/Postural Assessment  Cervical Assessment Cervical Assessment: Within Functional Limits Thoracic Assessment Thoracic Assessment: Within Functional Limits Lumbar Assessment Lumbar Assessment: Within Functional Limits Postural Control Postural Control: Within Functional Limits Trunk Control: mild proximal weakness  Balance Balance Balance Assessed: Yes Static Sitting Balance Static Sitting - Balance Support: Feet supported Static Sitting -  Level of Assistance: 6: Modified independent (Device/Increase time) Dynamic Sitting Balance Dynamic Sitting - Balance Support: Feet supported Dynamic Sitting - Level of Assistance: 5: Stand by assistance Static Standing Balance Static Standing - Balance Support: Bilateral upper extremity supported Static Standing - Level of Assistance: 5: Stand by assistance Dynamic Standing Balance Dynamic Standing - Balance Support: Bilateral upper extremity supported Dynamic Standing - Level of Assistance: 5: Stand by assistance Extremity Assessment  RUE Assessment RUE Assessment: Within Functional Limits Passive Range of Motion (PROM) Comments: WFL Active Range of Motion (AROM) Comments: WFL General Strength Comments: mmt grossly 4/5 LUE Assessment LUE Assessment: Within Functional Limits RLE Assessment RLE Assessment: Within Functional Limits General Strength Comments: Grossly 4+/5 LLE Assessment LLE Assessment: Within Functional Limits    Rosita DeChalus 03/16/2022, 8:35 AM

## 2022-03-16 NOTE — Progress Notes (Signed)
Speech Language Pathology Discharge Summary  Patient Details  Name: Tamara Bullock MRN: [id number] Date of Birth: 1941/04/16  Today's Date: 03/16/2022 SLP Individual Time: 0930-1015 SLP Individual Time Calculation (min): 45 min  Skilled Therapeutic Interventions:  Skilled ST treatment focused on family education with patient's daughter. SLP facilitated both verbal and written education re: expressive/receptive aphasia, word finding strategies, general communication strategies for both pt and communication partner, cognitive-communication impairments, attention and memory compensations, recommendations for 24 hour supervision at discharge, and follow up [facility] services with home health setting. Handouts were provided to reinforce education. All questions were addressed. Daughter verbalized understanding for 24 hour supervision recommendations. 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 Patient was left in wheelchair with alarm activated and immediate needs within reach at end of session.  Patient has met 3 of 6 long term goals.  Patient to discharge at overall Min;Supervision level.  Reasons goals not met: anxiety impacting functional participation, short LOS, slow progress   Clinical Impression/Discharge Summary: Patient has made slow gains and has met 3 of 6 long-term goals this admission. Participation appears to be intermittently impacted by anxiety in which patient dealt with at prior level per daughter. Patient is currently completing functional and basic cognitive tasks with min A verbal cues, and verbal expression tasks including word finding and speech intelligibility with supervision A verbal cues to communicate functional needs. Patient and family education is complete and patient to discharge at overall supervision-to-min A level. Patient's care  partner is independent to provide the necessary physical and cognitive assistance at discharge. Patient would benefit from continued SLP services in [facility] setting to maximize cognitive-communication, speech/language functions and functional independence.    Care Partner:  Caregiver Able to Provide Assistance: Yes  Type of Caregiver Assistance: Physical;Cognitive  Recommendation:  24 hour supervision/assistance;Home Health SLP  Rationale for SLP Follow Up: Maximize functional communication;Maximize cognitive function and independence;Reduce caregiver burden   Equipment: None provided or recommended by SLP   Reasons for discharge: Discharged from hospital   Patient/Family Agrees with Progress Made and Goals Achieved: Yes    Patty Sermons 03/16/2022, 7:49 AM

## 2022-03-16 NOTE — Progress Notes (Signed)
PROGRESS NOTE   Subjective/Complaints:  No pain c/os, affect bright and alert, stated one of her daughters is going to the beach this weekend   ROS: Patient denies CP, SOB, N/V/D    Objective:   No results found. No results for input(s): "WBC", "HGB", "HCT", "PLT" in the last 72 hours.   Recent Labs    03/15/22 1026  NA 137  K 3.6  CL 98  CO2 28  GLUCOSE 118*  BUN 26*  CREATININE 1.21*  CALCIUM 9.9      Intake/Output Summary (Last 24 hours) at 03/16/2022 0855 Last data filed at 03/16/2022 0528 Gross per 24 hour  Intake 940 ml  Output --  Net 940 ml         Physical Exam: Vital Signs Blood pressure (!) 134/51, pulse 70, temperature 97.6 F (36.4 C), resp. rate 15, height 5\' 3"  (1.6 m), weight 62.3 kg, SpO2 96 %.   General: No acute distress Mood and affect are appropriate Heart: Regular rate and rhythm no rubs murmurs or extra sounds Lungs: Clear to auscultation, breathing unlabored, no rales or wheezes Abdomen: Positive bowel sounds, soft nontender to palpation, nondistended Extremities: No clubbing, cyanosis, or edema  Skin: No evidence of breakdown, no evidence of rash Neurologic: Cranial nerves II through XII intact, motor strength is close to 5/5 in bilateral deltoid, bicep, tricep, grip, hip flexor, knee extensors, ankle dorsiflexor and plantar flexor. Mild right sided weakness. Good sitting balance  No abnormal tone appreciated.   Musculoskeletal: Full range of motion in all 4 extremities. No joint swelling   Assessment/Plan: 1. Functional deficits which require 3+ hours per day of interdisciplinary therapy in a comprehensive inpatient rehab setting. Physiatrist is providing close team supervision and 24 hour management of active medical problems listed below. Physiatrist and rehab team continue to assess barriers to discharge/monitor patient progress toward functional and medical  goals  Care Tool:  Bathing    Body parts bathed by patient: Right arm, Left arm, Chest, Abdomen, Front perineal area, Right upper leg, Left upper leg, Face, Right lower leg, Left lower leg   Body parts bathed by helper: Buttocks     Bathing assist Assist Level: Minimal Assistance - Patient > 75%     Upper Body Dressing/Undressing Upper body dressing   What is the patient wearing?: Pull over shirt    Upper body assist Assist Level: Supervision/Verbal cueing    Lower Body Dressing/Undressing Lower body dressing      What is the patient wearing?: Underwear/pull up, Pants     Lower body assist Assist for lower body dressing: Contact Guard/Touching assist     Toileting Toileting    Toileting assist Assist for toileting: Contact Guard/Touching assist     Transfers Chair/bed transfer  Transfers assist     Chair/bed transfer assist level: Contact Guard/Touching assist     Locomotion Ambulation   Ambulation assist      Assist level: Contact Guard/Touching assist Assistive device: Walker-rolling Max distance: 75   Walk 10 feet activity   Assist     Assist level: Contact Guard/Touching assist Assistive device: Walker-rolling   Walk 50 feet activity   Assist Walk 50  feet with 2 turns activity did not occur: Safety/medical concerns (fatigue, orthostatic)  Assist level: Contact Guard/Touching assist Assistive device: Walker-rolling    Walk 150 feet activity   Assist Walk 150 feet activity did not occur: Safety/medical concerns         Walk 10 feet on uneven surface  activity   Assist Walk 10 feet on uneven surfaces activity did not occur: Safety/medical concerns         Wheelchair     Assist Is the patient using a wheelchair?: No             Wheelchair 50 feet with 2 turns activity    Assist            Wheelchair 150 feet activity     Assist          Blood pressure (!) 134/51, pulse 70, temperature 97.6  F (36.4 C), resp. rate 15, height 5\' 3"  (1.6 m), weight 62.3 kg, SpO2 96 %.  Medical Problem List and Plan: 1. 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  Antithrombotics: -DVT/anticoagulation:  Pharmaceutical: Lovenox             -antiplatelet therapy: DAPT X 3 weeks followed by Plavix alone 3. Chronic low back pain: Tylenol prn for chronic LBP. Add kpad. 4. Mood: team/family providing egosupport -seeing her husband will help.              -antipsychotic agents: N/A 5. Neuropsych: This patient maybe capable of making decisions on her own behalf. 6. Skin/Wound Care: Routine pressure relief measures.  7. Fluids/Electrolytes/Nutrition: Monitor I/O.     Latest Ref Rng & Units 03/15/2022   10:26 AM 03/12/2022    6:02 AM 03/08/2022    5:51 AM  BMP  Glucose 70 - 99 mg/dL 118  105  109   BUN 8 - 23 mg/dL 26  25  26    Creatinine 0.44 - 1.00 mg/dL 1.21  1.15  1.34   Sodium 135 - 145 mmol/L 137  140  138   Potassium 3.5 - 5.1 mmol/L 3.6  3.4  3.8   Chloride 98 - 111 mmol/L 98  97  101   CO2 22 - 32 mmol/L 28  30  26    Calcium 8.9 - 10.3 mg/dL 9.9  9.5  9.3    Low K+ will supplement, recheck BMET - improved continue KCL  8. HTN: Monitor BP TID--continue Torsemide, Losartan and Coreg.  Vitals:   03/15/22 2000 03/16/22 0355  BP: (!) 137/47 (!) 134/51  Pulse: (!) 56 70  Resp: 15 15  Temp: (!) 97.3 F (36.3 C) 97.6 F (36.4 C)  SpO2: 99% 96%  Controlled 6/16 9.  HFrEF/New CM: No signs of overload.  Continue Low salt diet. Strict I/O w/daily wts             -monitor for signs of overload.   Ej fx 30-35%             --on Torsemide, Losartan, Coreg, ASA and atorvastatin.             --plans for GDMT in the future.  10.  Hypothyroid: On levothyroxine] 11. H/o depression: Continue Sertraline and  Mirtazapine- emotional lability improved vs yesterday              --ativan bid prn as at home.  -ego support by family/rehab team 12. CKD:  BUN/SCr- 28/1.14 @ admission-->improved with IVF--18/1.19 --Avoid nephrotoxic  medications 13. Pre-diabetes: Hgb A1C-6.0. Dietary education.   14.  Mild hypo K+due to torsemide, added KCL qd- recheck 6/15 improved cont current dose   LOS: 9 days A FACE TO FACE EVALUATION WAS PERFORMED  Erick Colace 03/16/2022, 8:55 AM

## 2022-03-16 NOTE — Progress Notes (Signed)
Patient ID: Tamara Bullock, female   DOB: 1941-06-04, 81 y.o.   MRN: [id number]  Patient [facility] referral sent to [facility]

## 2022-03-16 NOTE — Progress Notes (Signed)
Occupational Therapy Session Note  Patient Details  Name: Tamara Bullock MRN: [id number] Date of Birth: Dec 19, 1940  Today's Date: 03/16/2022 OT Individual Time: 1115-1200 session 1 OT Individual Time Calculation (min): 45 min  Session 2: 1415-1530 ( 75 mins)   Short Term Goals: Week 1:  OT Short Term Goal 1 (Week 1): Pt will perform sit to stand in preparation for ADL's and mobility with close S ad min cues OT Short Term Goal 2 (Week 1): Pt will complete UB bathing and dressing with set up and min cues OT Short Term Goal 3 (Week 1): Pt will complete LB bathing and dressing with close S after set up and min cues with intermittent standing with unilateral UE support OT Short Term Goal 4 (Week 1): Pt will perform toilet and shower bench transfers with close s and min cues using grab bar for support OT Short Term Goal 5 (Week 1): Pt will stand up to 5 minutes for simple item retrieval at kitchen counter with close S  Skilled Therapeutic Interventions/Progress Updates:  Session 1: Pt greeted seated in w/c with daughter Darreld Mclean present, pt      agreeable to OT intervention. Session focus on family education. Provided general education on pts current level of functional with ADLs/ functional mobility being supervision- MODI.    Total a transport to tub room where daughter observed pt completing tub shower transfer to TTB with supervision with Rw, education provided on general fall prevention for showering such as removing mats, making sure no water on floor and recommendation of providing pt with supervision while pt in the shower.  Education provided on pt needing supervision for functional ambulation in the kitchen and for IADLs such as meal prep, laundry, housekeeping and med mgmt, daughter verbalized understanding of all education.  Pt completed functional ambulation in hallway with Rw and supervision. Pt did have moment of weakness, assessed BP from sitting at 124/113( 118). Pt also completed 9HPT  as part of DC assessment with score indicated below: LUE- 32 secs RUE- 1 min and 2 secs Pt able to ambulate back to room where pt left seated EOB.  Of note did sign off daughter to assist pt with toileting/ toilet transfers and alerted LPN ashley.           Session 2:  Pt greeted supine in bed, pt agreeable to OT intervention. Session focus on RUE [facility], BADL reeducation, and functional mobility.  Pt completed supine>sit MODI. pt completed ambulatory toilet transfer with RW MODI, supervision for 3/3 toileting tasks. When pt was exiting bathroom pt reports fatigue and feeling "weak" asking to sit down and stay in room remainder of session.  Pt completed seated FMC task of stringing beads with RUE with supervision. Pt reports fatigue after completing activity. Pt then reports need to void bowels; MOD I transfer into bathroom with RW, +BM with pt able to complete 3/3 toileting tasks with supervision no reports of weakness or fatigue during this bout of functional mobility. Pt request to ambulate to bed this time.      Remainder of session focus on bed level therapeutic activities focused on RUE [facility], sequencing problem solving and organization for safe completion of ADLs: - pt completed visual motor planning task where pt instructed to duplicate pattern shown from visual aid using small ping pong balls; pt needed MOD verbal cues to problem solve how to arrange balls in correct sequence in order to match the visual aid. - utilized [place] game to work on  RUE proprioception/sensation. pt needed MAX verbal/ demonstrational cues to problem solve correct rules of game. Pt was able to use RUE to remove blocks but did have difficulties d/t impaired peripheral sensation in R digits.     Pt noted to become tearful at end of session as pt worried about her husband, provided emotional support and therapeutic use of self.  Pt left supine in bed with bed alarm activated and all needs within reach.          Therapy  Documentation Precautions:  Precautions Precautions: Fall Precaution Comments: Mild expressive aphasia Restrictions Weight Bearing Restrictions: No  Therapy Vitals Temp: 97.6 F (36.4 C) Pulse Rate: (!) 56 Resp: 16 BP: (!) 122/40 Patient Position (if appropriate): Lying Oxygen Therapy SpO2: 97 % O2 Device: Room Air Pain: No pain reported during either session     Therapy/Group: Individual Therapy  Pollyann Glen [facility] 03/16/2022, 4:02 PM

## 2022-03-17 NOTE — Progress Notes (Addendum)
PROGRESS NOTE   Subjective/Complaints:  Patient seen in bed today excited about going home.  Says her daughter will be coming to pick her up no pain today and bright affect.  ROS: Denies CP, SOB, N/V/D  Objective:   No results found. No results for input(s): "WBC", "HGB", "HCT", "PLT" in the last 72 hours.   Recent Labs    03/15/22 1026  NA 137  K 3.6  CL 98  CO2 28  GLUCOSE 118*  BUN 26*  CREATININE 1.21*  CALCIUM 9.9     Intake/Output Summary (Last 24 hours) at 03/17/2022 1033 Last data filed at 03/16/2022 2111 Gross per 24 hour  Intake 474 ml  Output --  Net 474 ml        Physical Exam: Vital Signs Blood pressure (!) 145/68, pulse 64, temperature 98 F (36.7 C), resp. rate 15, height 5\' 3"  (1.6 m), weight 62.3 kg, SpO2 100 %.   General: Not in acute distress.  Mood and affect appropriate Heart: Regular rate and rhythm no rubs murmurs or extra sounds Lungs: CTA bilaterally breathing unlabored no rales or wheezes Abdomen: Bowel sounds positive all quadrants nontender to palpation non-distended Extremities: No clubbing cyanosis or edema Skin: No rash or skin breakdown Neurologic: Cranial nerves II through XII intact, motor strength nearly 5/5 in bilateral deltoid, bicep, tricep, grip, hip flexor, knee flexors extensors, ankle dorsiflexor and plantar flexor.  Mild right-sided weakness.  Sitting balance is good no abnormal tone noticed on exam musculoskeletal: No joint swelling.  FROM all 4 extremities.  Assessment/Plan: 1. Functional deficits which require 3+ hours per day of interdisciplinary therapy in a comprehensive inpatient rehab setting. Physiatrist is providing close team supervision and 24 hour management of active medical problems listed below. Physiatrist and rehab team continue to assess barriers to discharge/monitor patient progress toward functional and medical goals  Care Tool:  Bathing     Body parts bathed by patient: Right arm, Left arm, Chest, Abdomen, Front perineal area, Right upper leg, Left upper leg, Face, Right lower leg, Left lower leg, Buttocks   Body parts bathed by helper: Buttocks     Bathing assist Assist Level: Supervision/Verbal cueing     Upper Body Dressing/Undressing Upper body dressing   What is the patient wearing?: Pull over shirt    Upper body assist Assist Level: Minimal Assistance - Patient > 75%    Lower Body Dressing/Undressing Lower body dressing      What is the patient wearing?: Pants     Lower body assist Assist for lower body dressing: Minimal Assistance - Patient > 75%     Toileting Toileting    Toileting assist Assist for toileting: Independent with assistive device     Transfers Chair/bed transfer  Transfers assist     Chair/bed transfer assist level: Independent with assistive device     Locomotion Ambulation   Ambulation assist      Assist level: Supervision/Verbal cueing Assistive device: Walker-rolling Max distance: 139ft   Walk 10 feet activity   Assist     Assist level: Supervision/Verbal cueing Assistive device: Walker-rolling   Walk 50 feet activity   Assist Walk 50 feet with 2 turns  activity did not occur: Safety/medical concerns (fatigue, orthostatic)  Assist level: Supervision/Verbal cueing Assistive device: Walker-rolling    Walk 150 feet activity   Assist Walk 150 feet activity did not occur: Safety/medical concerns  Assist level: Supervision/Verbal cueing Assistive device: Walker-platform    Walk 10 feet on uneven surface  activity   Assist Walk 10 feet on uneven surfaces activity did not occur: Safety/medical concerns   Assist level: Contact Guard/Touching assist Assistive device: Walker-rolling   Wheelchair     Assist Is the patient using a wheelchair?: No             Wheelchair 50 feet with 2 turns activity    Assist             Wheelchair 150 feet activity     Assist          Blood pressure (!) 145/68, pulse 64, temperature 98 F (36.7 C), resp. rate 15, height 5\' 3"  (1.6 m), weight 62.3 kg, SpO2 100 %.  Medical Problem List and Plan: 1. Functional deficits secondary to large left basal ganglia infarct             -patient may shower             -ELOS/Goals: D/C in am 03/17/22            -Continue CIR therapies including PT, OT, and SLP ,   -Grounds pass ordered 6/17 Going home today. 2.  Antithrombotics: -DVT/anticoagulation:  Pharmaceutical: Lovenox             -antiplatelet therapy: DAPT X 3 weeks followed by Plavix alone 3. Chronic low back pain: Tylenol prn for chronic LBP. Add kpad. 4. Mood: team/family providing egosupport -seeing her husband will help.              -antipsychotic agents: N/A 5. Neuropsych: This patient maybe capable of making decisions on her own behalf. 6. Skin/Wound Care: Routine pressure relief measures.  7. Fluids/Electrolytes/Nutrition: Monitor I/O.     Latest Ref Rng & Units 03/15/2022   10:26 AM 03/12/2022    6:02 AM 03/08/2022    5:51 AM  BMP  Glucose 70 - 99 mg/dL 118  105  109   BUN 8 - 23 mg/dL 26  25  26    Creatinine 0.44 - 1.00 mg/dL 1.21  1.15  1.34   Sodium 135 - 145 mmol/L 137  140  138   Potassium 3.5 - 5.1 mmol/L 3.6  3.4  3.8   Chloride 98 - 111 mmol/L 98  97  101   CO2 22 - 32 mmol/L 28  30  26    Calcium 8.9 - 10.3 mg/dL 9.9  9.5  9.3    Low K+ will supplement, recheck BMET - improved continue KCL  6/17 Potassium improved, okay for discharge 8. HTN: Monitor BP TID--continue Torsemide, Losartan and Coreg.  Vitals:   03/16/22 1948 03/17/22 0446  BP: (!) 122/56 (!) 145/68  Pulse: 70 64  Resp: 16 15  Temp: 97.6 F (36.4 C) 98 F (36.7 C)  SpO2: 98% 100%  Controlled 6/16 6/17 BP controlled for discharge. 9.  HFrEF/New CM: No signs of overload.  Continue Low salt diet. Strict I/O w/daily wts             -monitor for signs of overload.   Ej fx  30-35%             --on Torsemide, Losartan, Coreg, ASA and atorvastatin.             --  plans for GDMT in the future.  10. Hypothyroid: On levothyroxine] 11. H/o depression: Continue Sertraline and Mirtazapine- emotional lability improved vs yesterday              --ativan bid prn as at home.  -ego support by family/rehab team 12. CKD:  BUN/SCr- 28/1.14 @ admission-->improved with IVF--18/1.19 --Avoid nephrotoxic  medications 13. Pre-diabetes: Hgb A1C-6.0. Dietary education.   14.  Mild hypo K+due to torsemide, added KCL qd- recheck 6/15 improved cont current dose  6/17 K+ 3.6 on 03/15/22, stable for discharge    Latest Ref Rng & Units 03/15/2022   10:26 AM 03/12/2022    6:02 AM 03/08/2022    5:51 AM  BMP  Glucose 70 - 99 mg/dL 762  263  335   BUN 8 - 23 mg/dL 26  25  26    Creatinine 0.44 - 1.00 mg/dL  4.56  2.56   Sodium 135 - 145 mmol/L 137  140  138   Potassium 3.5 - 5.1 mmol/L 3.6  3.4  3.8   Chloride 98 - 111 mmol/L 98  97  101   CO2 22 - 32 mmol/L 28  30  26    Calcium 8.9 - 10.3 mg/dL 9.9  9.5  9.3     Spent 45 minutes on physical examination, discharge instructions education, and coordination of discharge.  All questions addressed.   LOS: 10 days A FACE TO FACE EVALUATION WAS PERFORMED  Received phone call from patient's daughter Sherita Decoste  that pt did not have Euthyrox 50 mcg tablet delivered at d/c.  She also only received 1 week of of Plavix 75 mg instead of a 30 day supply, and received a 30 day supply of ASA instead of a one week supply.  Contacted the [facility] pharmacy and was told that the [facility] pharmacy was closed over the weekend.  Placed 30 day supply order with [facility] for Euthyrox 50 mcg, 30 tablets, 0 refills.  Will have primary team f/u on Monday with TOC for additional Plavix needed.  [facility] 03/17/2022, 10:33 AM

## 2022-03-20 NOTE — Progress Notes (Signed)
Inpatient Rehabilitation Care Coordinator Discharge Note   Patient Details  Name: Tamara Bullock MRN: [id number] Date of Birth: 26-Jun-1941   Discharge location: Home  Length of Stay:    Discharge activity level: Sup/Min  Home/community participation: daughter  Patient response SN:[id number] Literacy - How often do you need to have someone help you when you read instructions, pamphlets, or other written material from your doctor or pharmacy?: Never  Patient response BH:ALPFXT Isolation - How often do you feel lonely or isolated from those around you?: Never  Services provided included: MD, RD, PT, OT, SLP, RN, CM, TR, Pharmacy, SW  Financial Services:  Field seismologist Utilized: Barrister's clerk MEDICARE  Choices offered to/list presented to: patient/daughter  Follow-up services arranged:  Home Health Home Health Agency: [facility]         Patient response to transportation need: Is the patient able to respond to transportation needs?: Yes In the past 12 months, has lack of transportation kept you from medical appointments or from getting medications?: Yes In the past 12 months, has lack of transportation kept you from meetings, work, or from getting things needed for daily living?: Yes    Comments (or additional information):  Patient/Family verbalized understanding of follow-up arrangements:  Yes  Individual responsible for coordination of the follow-up plan:    Confirmed correct DME delivered: [name] 03/20/2022    [name]

## 2022-03-30 ENCOUNTER — Ambulatory Visit (INDEPENDENT_AMBULATORY_CARE_PROVIDER_SITE_OTHER): Payer: Medicare Other

## 2022-03-30 DIAGNOSIS — I4891 Unspecified atrial fibrillation: Secondary | ICD-10-CM

## 2022-03-30 DIAGNOSIS — I639 Cerebral infarction, unspecified: Secondary | ICD-10-CM

## 2022-04-03 NOTE — Progress Notes (Unsigned)
Cardiology Office Note:    Date:  04/03/2022   ID:  Tamara Bullock, DOB 25-Jan-1941, MRN [id number]  PCP:  Tamara Presto, MD  Cardiologist:  Tamara Fus, MD  Electrophysiologist:  None   Referring MD: No ref. provider found   Chief Complaint: hospital follow-up of new cardiomyopathy  History of Present Illness:    Tamara Bullock is a 81 y.o. female with a history of newly diagnosed HFrEF with EF of 30-35% on Echo in 03/2022,  hypertension, hyperlipidemia, recent stroke in 03/2022, CKD stage III, and hypothyroidism who is followed by Dr. Carolan Bullock and presents today for hospital follow-up of new cardiomyopathy.  Patient was previously followed by Cardiology at [facility] and Vascular. Prior Myoview in 07/2021 for further evaluation of intermittent chest pain showed no evidence of ischemia or infarction. EF on Myoview was 32% but Echo on same day showed LVEF of 60-65% with grade 2 diastolic dysfunction, mild MR, and moderate TR. Patient was recently admitted at [facility] from 03/02/2022 to 03/07/2022 for an acute basal ganglia stroke after presenting with sudden onset of expressive aphasia and right sided weakness. She was noted to be markedly hypertensive on arrival with systolic BP in the 200s. Echo showed LVEF of 30-35% with global hypokinesis and mildly elevated RVSP of 36.6 mmHg. She was seen by Cardiology. She was already on Torsemide, Losartan, and Coreg prior to admission but these were all held to allow for permissive hypertension in setting of acute stroke. These were gradually restarted prior to discharge. Amiloride and Clonidine patch were stopped during admission. Plan was for outpatient Event Monitor and Myoview. She was discharged to CIR and then was able to return home on 03/17/2022.  Patient presents today for follow-up. ***  HFrEF Patient recently admitted with acute stroke and work-up showed newly reduced EF. Echo showed LVEF of 30-35% with global hypokinesis. -  Euvolemic on exam. - Continue Torsemide 20mg  daily. *** - Continue Losartan 12.5mg  daily. *** Switch to Entresto *** - Continue Coreg 3.125mg  twice daily. - Etiology unclear. 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 ***  Hypertension BP *** - Continue medications for CHF as above.  Hyperlipidemia Lipid panel during recent admission: Total Cholesterol 278, Triglycerides 112, HDL 58, LDL 198.  - Started on Lipitor 80mg  daily during admission. Continue.  - Will need repeat lipid panel and LFTs in a couple of weeks. ***  CKD Stage III Baseline creatinine around 1.0 to 1.2.  Recent Stroke Recently admitted with acute basal ganglia stroke in 03/2022.  - He was treated with DAPT with Aspirin and Plavix for 21 days at which time Aspirin was stopped. Plans is for Plavix indefinitely.   Past Medical History:  Diagnosis Date   Essential hypertension    Hyperlipidemia     No past surgical history on file.  Current Medications: No outpatient medications have been marked as taking for the 04/09/22 encounter (Appointment) with 03/2022, PA-C.     Allergies:   Diphenhydramine hcl, Moxifloxacin hcl in nacl, Penicillin g, Sulfa antibiotics, Colistin, Diclofenac sodium, Esomeprazole, Estradiol, Medroxyprogesterone acetate, Neomycin-polymyxin-hc, Nitrofurantoin, Nsaids, Prednisone, Proparacaine, Rabeprazole, Amitriptyline, Aspirin, Azithromycin, Carbocaine [mepivacaine], Cimetidine, Ciprofloxacin, Cisapride, Clindamycin/lincomycin, Cortisporin-tc [neomycin-colist-hc-thonzonium], Escitalopram, Hydrochlorothiazide, Hyoscyamine, Ibuprofen, Iodine, Medroxyprogesterone, Meloxicam, Minocycline, Moxifloxacin, Nitrofuran derivatives, Ofloxacin, Pantoprazole, Penicillins, Pneumococcal vaccines, Prednisolone, Prevacid [lansoprazole], Propoxyphene, Soap, Spironolactone, Tobradex [tobramycin-dexamethasone], Trolamine  salicylate, Verapamil, Voltaren [diclofenac], Diphenhydramine, Hydrocodone, and Tramadol   Social History   Socioeconomic History  Marital status: Married    Spouse name: Not on file   Number of children: Not on file   Years of education: Not on file   Highest education level: Not on file  Occupational History   Not on file  Tobacco Use   Smoking status: Never    Passive exposure: Never   Smokeless tobacco: Never  Substance and Sexual Activity   Alcohol use: Not on file   Drug use: Not on file   Sexual activity: Not on file  Other Topics Concern   Not on file  Social History Narrative   Not on file   Social Determinants of Health   Financial Resource Strain: Not on file  Food Insecurity: Not on file  Transportation Needs: Not on file  Physical Activity: Not on file  Stress: Not on file  Social Connections: Not on file     Family History: The patient's family history includes Heart attack in her father; Stroke in her mother.  ROS:   Please see the history of present illness.     EKGs/Labs/Other Studies Reviewed:    The following studies were reviewed today:  Echocardiogram 07/18/2021 ([facility]): Summary: Normal LV size and systolic function. Estimated EF biplane=60-65%.  Grade II diastolic dysfunction (moderate increase in filling pressures).  Moderate tricuspid valve regurgitation is noted by color. RVSP 58=mmHg, CVP=3. In comparison to echo report on 04/21/19, interval change is noted.  _______________  Celine Ahr 07/18/2021 ([facility]): Impressions: 1. Dyspnea, cough, weakness and overall strange feeling occurred during Lexiscan study.    2.  Nondiagnostic stress Lexiscan EKG changes.  3. Correlation of EKG and perfusion images demonstrate EKG is non-diagnostic with normal perfusion  4. SPECT : Normal perfusion with no evidence of ischemia or infarction.  5. Gated images reveal LVEF = 32%. This represents a abnormal functional finding.  By visual inspection the  ejection fraction appears to be better than this number suggest and recommendation is for an echocardiogram to assess LVEF  6. This is a low risk study by perfusion SPECT analysis 7. Compared to previous report from July 2020, this study reveals no  change in SPECT analysis however in the prior study the ejection fraction left ventricle was 72%.  _______________  Echocardiogram 03/03/2022: Impressions:  1. Left ventricular ejection fraction, by estimation, is 30 to 35%. The  left ventricle has moderately decreased function. The left ventricle  demonstrates global hypokinesis. Left ventricular diastolic parameters are  indeterminate.   2. Right ventricular systolic function is normal. The right ventricular  size is normal. There is mildly elevated pulmonary artery systolic  pressure.   3. Left atrial size was mildly dilated.   4. Trivial mitral valve regurgitation.   5. Aortic valve regurgitation is not visualized.   6. The inferior vena cava is normal in size with greater than 50%  respiratory variability, suggesting right atrial pressure of 3 mmHg.  EKG:  EKG not ordered today.   Recent Labs: 03/03/2022: Magnesium 2.3 03/08/2022: ALT 37 03/12/2022: Hemoglobin 11.6; Platelets 282 03/15/2022: BUN 26; Creatinine, Ser 1.21; Potassium 3.6; Sodium 137  Recent Lipid Panel    Component Value Date/Time   CHOL 278 (H) 03/03/2022 0414   TRIG 112 03/03/2022 0414   HDL 58 03/03/2022 0414   CHOLHDL 4.8 03/03/2022 0414   VLDL 22 03/03/2022 0414   LDLCALC 198 (H) 03/03/2022 0414    Physical Exam:    Vital Signs: There were no vitals taken for this visit.    Wt Readings from Last  3 Encounters:  03/14/22 137 lb 5.6 oz (62.3 kg)  03/07/22 142 lb 6.7 oz (64.6 kg)     General: 81 y.o. female in no acute distress. HEENT: Normocephalic and atraumatic. Sclera clear. EOMs intact. Neck: Supple. No carotid bruits. No JVD. Heart: *** RRR. Distinct S1 and S2. No murmurs, gallops, or rubs. Radial and  distal pedal pulses 2+ and equal bilaterally. Lungs: No increased work of breathing. Clear to ausculation bilaterally. No wheezes, rhonchi, or rales.  Abdomen: Soft, non-distended, and non-tender to palpation. Bowel sounds present in all 4 quadrants.  MSK: Normal strength and tone for age. *** Extremities: No lower extremity edema.    Skin: Warm and dry. Neuro: Alert and oriented x3. No focal deficits. Psych: Normal affect. Responds appropriately.   Assessment:    No diagnosis found.  Plan:     Disposition: Follow up in ***   Medication Adjustments/Labs and Tests Ordered: Current medicines are reviewed at length with the patient today.  Concerns regarding medicines are outlined above.  No orders of the defined types were placed in this encounter.  No orders of the defined types were placed in this encounter.   There are no Patient Instructions on file for this visit.   Signed, Darreld Mclean, PA-C  04/03/2022 6:20 PM    [facility]

## 2022-04-04 ENCOUNTER — Encounter: Payer: Self-pay | Admitting: Registered Nurse

## 2022-04-04 ENCOUNTER — Encounter: Payer: Medicare Other | Attending: Registered Nurse | Admitting: Registered Nurse

## 2022-04-04 VITALS — BP 137/71 | HR 61 | Ht 63.0 in | Wt 135.0 lb

## 2022-04-04 DIAGNOSIS — I10 Essential (primary) hypertension: Secondary | ICD-10-CM | POA: Diagnosis not present

## 2022-04-04 DIAGNOSIS — I69319 Unspecified symptoms and signs involving cognitive functions following cerebral infarction: Secondary | ICD-10-CM | POA: Diagnosis not present

## 2022-04-04 DIAGNOSIS — N1831 Chronic kidney disease, stage 3a: Secondary | ICD-10-CM | POA: Diagnosis not present

## 2022-04-04 DIAGNOSIS — I6381 Other cerebral infarction due to occlusion or stenosis of small artery: Secondary | ICD-10-CM | POA: Insufficient documentation

## 2022-04-04 NOTE — Progress Notes (Signed)
Subjective:    Patient ID: Tamara Bullock, female    DOB: Feb 16, 1941, 81 y.o.   MRN: [id number]  HPI: Tamara Bullock is a 81 y.o. 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 She was brought to [facility] on 03/02/2022 with sudden onset of aphasia and right sided weakness.   H&P: Dr Margo Aye : 03/02/2022 Chief Complaint: Sudden onset expressive aphasia and right sided weakness.   HPI: Tamara Bullock is a 81 y.o. female with medical history significant for generalized anxiety, essential hypertension, CKD 3B, who presented to [facility] ED from home via EMS due to sudden onset expressive aphasia.  Associated with right sided weakness.  Last known well was 03/02/22 around 11 AM.  Per EMS her SBP was >200 on arrival.  Presented outside of window for TPA.  Upon presentation to the ED, she was noted to be aphasic with decrease grip strength on the right.  CT head negative for any acute intracranial findings.  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  Seen by neurology/stroke team.  Recommended admission for stroke work-up.  TRH, hospitalist team, was asked to admit.   ED Course: Tmax 98.2.  BP 105/61 pulse 58.  Respiratory 24, with saturation 92 to 97% on room air.  Lab studies significant for BUN 29, creatinine 1.20, GFR 40.  CBC essentially unremarkable.  CT Head: WO Contrast IMPRESSION: 1. No acute intracranial abnormality. 2. ASPECTS is 10. 3. Age-related cerebral atrophy with mild chronic small vessel ischemic disease.  MRI/MRA IMPRESSION: MRI HEAD:   1. 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 2. Underlying age-related cerebral volume loss with mild chronic microvascular ischemic disease.   MRA HEAD:   1. Negative intracranial MRA for large vessel occlusion. No hemodynamically significant or correctable stenosis. 2. Mild  distal small vessel atheromatous irregularity.   MRA NECK:   1. Technically limited exam due to timing of the contrast bolus, with no contrast seen within the arterial system of the neck. 2. Mild atherosclerotic change about the carotid bifurcations/proximal ICAs bilaterally without hemodynamically significant greater than 50% stenosis. 3. Patent vertebral arteries within the neck without significant stenosis or other acute finding.   Cardiology and Nephrology was consulted.  She was admitted to inpatient rehabilitation on 03/07/2022, and discharged home on 03/17/2022.  She is receiving Home Health Therapy from [facility].  She denies any pain. Also reports her appetite is improving.    Pain Inventory Average Pain 0 Pain Right Now 0 My pain is aching  LOCATION OF PAIN  Jaw Pain  BOWEL Number of stools per week: 4 Oral laxative use Yes  Type of laxative Sen[place]ot  BLADDER Normal    Mobility use a walker how many minutes can you walk? 5  ability to climb steps?  no do you drive?  no Do you have any goals in this area?  yes  Function retired I need assistance with the following:  feeding, dressing, bathing, meal prep, household duties, and shopping Do you have any goals in this area?  yes  Neuro/Psych weakness trouble walking dizziness confusion anxiety  Prior Studies Any changes since last visit?  no  Physicians involved in your care Any changes since last visit?  no   Family History  Problem Relation Age of Onset   Stroke Mother    Heart attack Father    Social History  Socioeconomic History   Marital status: Married    Spouse name: Not on file   Number of children: Not on file   Years of education: Not on file   Highest education level: Not on file  Occupational History   Not on file  Tobacco Use   Smoking status: Never    Passive exposure: Never   Smokeless tobacco: Never  Vaping Use   Vaping Use: Never used  Substance and Sexual Activity    Alcohol use: Not Currently   Drug use: Never   Sexual activity: Not on file  Other Topics Concern   Not on file  Social History Narrative   Not on file   Social Determinants of Health   Financial Resource Strain: Not on file  Food Insecurity: Not on file  Transportation Needs: Not on file  Physical Activity: Not on file  Stress: Not on file  Social Connections: Not on file   History reviewed. No pertinent surgical history. Past Medical History:  Diagnosis Date   Essential hypertension    Hyperlipidemia    BP 137/71   Pulse 61   Ht 5\' 3"  (1.6 m)   Wt 135 lb (61.2 kg)   SpO2 96%   BMI 23.91 kg/m   Opioid Risk Score:   Fall Risk Score:  `1  Depression screen [facility] 2/9     04/04/2022   10:23 AM  Depression screen PHQ 2/9  Decreased Interest 2  Down, Depressed, Hopeless 1  PHQ - 2 Score 3  Altered sleeping 1  Tired, decreased energy 2  Change in appetite 1  Feeling bad or failure about yourself  0  Trouble concentrating 2  Moving slowly or fidgety/restless 2  Suicidal thoughts 0  PHQ-9 Score 11    Review of Systems  Musculoskeletal:  Positive for gait problem.  Neurological:        Problem getting words out  Psychiatric/Behavioral:  Positive for confusion.   All other systems reviewed and are negative.      Objective:   Physical Exam Vitals and nursing note reviewed.  Constitutional:      Appearance: Normal appearance.  Cardiovascular:     Rate and Rhythm: Normal rate and regular rhythm.     Pulses: Normal pulses.     Heart sounds: Normal heart sounds.  Pulmonary:     Effort: Pulmonary effort is normal.     Breath sounds: Normal breath sounds.  Musculoskeletal:     Cervical back: Normal range of motion and neck supple.     Comments: Normal Muscle Bulk and Muscle Testing Reveals:  Upper Extremities: Full ROM and Muscle Strength 5/5 Lower Extremities: Full ROM and Muscle Strength 5/5 Arises from Table slowly using walker for support Narrow Based   Gait     Skin:    General: Skin is warm and dry.  Neurological:     Mental Status: She is alert and oriented to person, place, and time.  Psychiatric:        Mood and Affect: Mood normal.        Behavior: Behavior normal.         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 Continue Home Health Therapy with [facility]. Continue current medication regimen. Continue to Monitor.   Essential Hypertension: Continue current medication regimen. PCP Following. Continue to Monitor.   Stage 3a chronic kidney disease. : PCP Following. Continue to Monitor.   F/U  with Dr Letta Pate in 4- 6 weeks

## 2022-04-08 ENCOUNTER — Encounter: Payer: Self-pay | Admitting: Student

## 2022-04-09 ENCOUNTER — Encounter: Payer: Self-pay | Admitting: Student

## 2022-04-09 ENCOUNTER — Ambulatory Visit: Payer: Medicare Other | Admitting: Student

## 2022-04-09 VITALS — BP 130/58 | HR 64 | Ht 63.0 in | Wt 134.8 lb

## 2022-04-09 DIAGNOSIS — I10 Essential (primary) hypertension: Secondary | ICD-10-CM | POA: Diagnosis not present

## 2022-04-09 DIAGNOSIS — N183 Chronic kidney disease, stage 3 unspecified: Secondary | ICD-10-CM | POA: Diagnosis not present

## 2022-04-09 DIAGNOSIS — E785 Hyperlipidemia, unspecified: Secondary | ICD-10-CM | POA: Diagnosis not present

## 2022-04-09 DIAGNOSIS — I639 Cerebral infarction, unspecified: Secondary | ICD-10-CM

## 2022-04-09 DIAGNOSIS — I5020 Unspecified systolic (congestive) heart failure: Secondary | ICD-10-CM | POA: Diagnosis not present

## 2022-04-09 MED ORDER — CARVEDILOL 3.125 MG PO TABS: 3.1250 mg | ORAL_TABLET | Freq: Two times a day (BID) | ORAL | 3 refills | Status: DC

## 2022-04-09 MED ORDER — LOSARTAN POTASSIUM 25 MG PO TABS: 25.0000 mg | ORAL_TABLET | Freq: Every day | ORAL | 3 refills | Status: DC

## 2022-04-09 MED ORDER — TORSEMIDE 20 MG PO TABS: 40.0000 mg | ORAL_TABLET | Freq: Every day | ORAL | 3 refills | Status: DC

## 2022-04-09 NOTE — Patient Instructions (Signed)
  Testing/Procedures:  Your physician has requested that you have a lexiscan myoview. For further information please visit [email or web address] Please follow instruction sheet, as given. [address]   Follow-Up: At [facility], you and your health needs are our priority.  As part of our continuing mission to provide you with exceptional heart care, we have created designated Provider Care Teams.  These Care Teams include your primary Cardiologist (physician) and Advanced Practice Providers (APPs -  Physician Assistants and Nurse Practitioners) who all work together to provide you with the care you need, when you need it.  We recommend signing up for the patient portal called "MyChart".  Sign up information is provided on this After Visit Summary.  MyChart is used to connect with patients for Virtual Visits (Telemedicine).  Patients are able to view lab/test results, encounter notes, upcoming appointments, etc.  Non-urgent messages can be sent to your provider as well.   To learn more about what you can do with MyChart, go to [email or web address].    Your next appointment:   2 month(s)  The format for your next appointment:   In Person  Provider:  Marjie Skiff PA-C {   Important Information About Sugar         Heart Failure Education:    Weigh yourself EVERY morning after you go to the bathroom but before you eat or drink anything. Write this number down in a weight log/diary. If you gain 3 pounds overnight or 5 pounds in a week, call the office. Take your medicines as prescribed. If you have concerns about your medications, please call us before you stop taking them.  Eat low salt foods--Limit salt (sodium) to 2000 mg per day. This will help prevent your body from holding onto fluid. Read food labels as many processed foods have a lot of sodium, especially canned goods and prepackaged meats. If you would like some assistance choosing low sodium foods, we would  be happy to set you up with a nutritionist. Limit all fluids for the day to less than 2 liters (64 ounces). Fluid includes all drinks, coffee, juice, ice chips, soup, jello, and all other liquids. Stay as active as you can everyday. Staying active will give you more energy and make your muscles stronger. Start with 5 minutes at a time and work your way up to 30 minutes a day. Break up your activities--do some in the morning and some in the afternoon. Start with 3 days per week and work your way up to 5 days as you can.  If you have chest pain, feel short of breath, dizzy, or lightheaded, STOP. If you don't feel better after a short rest, call 911. If you do feel better, call the office to let us know you have symptoms with exercise.

## 2022-04-10 ENCOUNTER — Encounter: Payer: Self-pay | Admitting: Registered Nurse

## 2022-04-10 ENCOUNTER — Other Ambulatory Visit: Payer: Self-pay

## 2022-04-10 DIAGNOSIS — I4891 Unspecified atrial fibrillation: Secondary | ICD-10-CM

## 2022-04-10 DIAGNOSIS — N183 Chronic kidney disease, stage 3 unspecified: Secondary | ICD-10-CM

## 2022-04-10 DIAGNOSIS — I5020 Unspecified systolic (congestive) heart failure: Secondary | ICD-10-CM

## 2022-04-10 DIAGNOSIS — Z79899 Other long term (current) drug therapy: Secondary | ICD-10-CM

## 2022-04-10 DIAGNOSIS — I10 Essential (primary) hypertension: Secondary | ICD-10-CM

## 2022-04-10 LAB — BASIC METABOLIC PANEL
BUN/Creatinine Ratio: 17 (ref 12–28)
BUN: 19 mg/dL (ref 8–27)
CO2: 28 mmol/L (ref 20–29)
Calcium: 9.8 mg/dL (ref 8.7–10.3)
Chloride: 98 mmol/L (ref 96–106)
Creatinine, Ser: 1.15 mg/dL — ABNORMAL HIGH (ref 0.57–1.00)
Glucose: 108 mg/dL — ABNORMAL HIGH (ref 70–99)
Potassium: 4.1 mmol/L (ref 3.5–5.2)
Sodium: 141 mmol/L (ref 134–144)
eGFR: 48 mL/min/{1.73_m2} — ABNORMAL LOW (ref >59)

## 2022-04-10 LAB — PRO B NATRIURETIC PEPTIDE: NT-Pro BNP: 1208 pg/mL — ABNORMAL HIGH (ref 0–738)

## 2022-04-10 MED ORDER — ENTRESTO 24-26 MG PO TABS: 1.0000 | ORAL_TABLET | Freq: Two times a day (BID) | ORAL | 6 refills | Status: DC

## 2022-04-19 ENCOUNTER — Other Ambulatory Visit: Payer: Self-pay

## 2022-04-19 DIAGNOSIS — I5020 Unspecified systolic (congestive) heart failure: Secondary | ICD-10-CM

## 2022-04-19 DIAGNOSIS — I10 Essential (primary) hypertension: Secondary | ICD-10-CM

## 2022-04-19 DIAGNOSIS — I4891 Unspecified atrial fibrillation: Secondary | ICD-10-CM

## 2022-04-19 DIAGNOSIS — N183 Chronic kidney disease, stage 3 unspecified: Secondary | ICD-10-CM

## 2022-04-19 DIAGNOSIS — Z79899 Other long term (current) drug therapy: Secondary | ICD-10-CM

## 2022-04-20 LAB — BASIC METABOLIC PANEL
BUN/Creatinine Ratio: 22 (ref 12–28)
BUN: 28 mg/dL — ABNORMAL HIGH (ref 8–27)
CO2: 25 mmol/L (ref 20–29)
Calcium: 9.6 mg/dL (ref 8.7–10.3)
Chloride: 99 mmol/L (ref 96–106)
Creatinine, Ser: 1.29 mg/dL — ABNORMAL HIGH (ref 0.57–1.00)
Glucose: 98 mg/dL (ref 70–99)
Potassium: 4.3 mmol/L (ref 3.5–5.2)
Sodium: 142 mmol/L (ref 134–144)
eGFR: 41 mL/min/{1.73_m2} — ABNORMAL LOW (ref >59)

## 2022-04-24 NOTE — Addendum Note (Signed)
Addended by: Alyson Ingles on: 04/24/2022 08:59 AM   Modules accepted: Orders

## 2022-04-26 ENCOUNTER — Ambulatory Visit: Payer: Medicare Other | Admitting: Neurology

## 2022-04-26 ENCOUNTER — Encounter: Payer: Self-pay | Admitting: Neurology

## 2022-04-26 VITALS — BP 125/70 | HR 61 | Ht 63.0 in | Wt 133.8 lb

## 2022-04-26 DIAGNOSIS — I10 Essential (primary) hypertension: Secondary | ICD-10-CM

## 2022-04-26 DIAGNOSIS — E785 Hyperlipidemia, unspecified: Secondary | ICD-10-CM | POA: Diagnosis not present

## 2022-04-26 DIAGNOSIS — I639 Cerebral infarction, unspecified: Secondary | ICD-10-CM | POA: Diagnosis not present

## 2022-04-26 NOTE — Patient Instructions (Addendum)
Continue Plavix daily When you finish heart monitor, let me know of the results, consider loop recorder  Monitor BP at home keep BP < 130/90 Goal of LDL < 70, continue the Lipitor I will see you back in 4-6 months

## 2022-04-26 NOTE — Progress Notes (Signed)
Patient: Tamara Bullock Date of Birth: [birth year]  Reason for Visit: Follow up History from: Patient, daughter  Primary Neurologist: saw Dr. Leonie Man   ASSESSMENT AND PLAN 81 y.o. year old female   1.  Stroke, large left basal ganglia infarct, likely secondary to embolism from cryptogenic source -Wearing 30-day cardiac monitor, has seen cardiology -If cardiac monitor is negative, consider referral to EP for consideration of loop recorder -Continue Plavix 75 mg daily -Continue home health ST/PT  2.  Hypertension -Goal less than 130/90 -On carvedilol, Entresto, torsemide -Encouraged to keep a log at home, initial reading was low  3.  Hyperlipidemia -LDL 198, goal less than 70 -Atorvastatin 80 mg daily was added  4.  Heart failure with reduced EF -Sees cardiology, on torsemide, Entresto -Scheduled for outpatient Myoview for ischemic evaluation  I will see her back in 4 to 6 months or sooner if needed.  I asked him to let me know when the heart monitor is read, I will place a referral to EP for consideration of loop recorder.  For any acute stroke symptoms go to the ER. Discussed the importance of aggressive risk factor modification.  HISTORY OF PRESENT ILLNESS: Today 04/26/22 Tamara Bullock is here today for stroke clinic follow-up with large left basal ganglia infarct on 03/02/22, presented with right-sided facial droop, weakness, expressive aphasia.  Went to inpatient rehab 6/7-6/17. Has seen cardiology, has 1 week left of wearing heart monitor. Strength to right side is normal, still working on fine motor and coordination. Speech has slight stuttering, lisp, word finding. Doing ST/PT in the home. Is now on Entresto. On Lipitor 80 mg now. BP 98/50 today, was 125/70 manual. Schedule 8/22 for myoview perfusion. They are planning to move to independent living with her husband.   -CT head showed no acute abnormality.  There was small vessel disease. -MRI brain acute ischemic stroke  left basal ganglia -MRA head negative for intracranial large vessel occlusion -MRA neck was technically limited due to timing of contrast bolus, showed mild atherosclerotic change, carotid bifurcation/proximal ICA bilaterally greater than 50% -2D echo EF 30 to 35%, left ventricle moderately decreased function -LDL 198 -A1c 6.0 -On aspirin 81 mg daily prior to admission, 3 weeks DAPT with aspirin and Plavix, then Plavix 75 mg daily alone  HISTORY  Copied Dr. Leonie Man 03/03/22 INTERVAL HISTORY Her son and daughter are the bedside. They are reporting patient is having difficulty with her words and expressing herself She also has balance difficulties when walking. She has been under extreme stress taking care of her ill spouse with cancer This am her aphasia has some what improved, still  having difficulty expressive aphasia. She has been seen by speech for swallowing evaluation No c/o headache, visual changes, dizziness, syncope, numbness and tingling MRI scan shows a large 2.2 cm left basal ganglia infarct.  LDL cholesterol is 198 mg percent.  Hemoglobin A1c 6.0.  Echocardiogram is pending.  MR angiogram of the brain is negative for any large vessel stenosis or occlusion.  MRA of the neck shows mild atherosclerotic changes at both bifurcations.  REVIEW OF SYSTEMS: Out of a complete 14 system review of symptoms, the patient complains only of the following symptoms, and all other reviewed systems are negative.  See HPI  ALLERGIES: Allergies  Allergen Reactions   Diphenhydramine Hcl Shortness Of Breath    Dermatologicals-iv administration was painful until diluted; can take liquid in small doses by mouth.   Moxifloxacin Hcl In Nacl  Other reaction(s): Other (see comments) Fluoroquinolones-short term stomach and chest pain   Penicillin G Hives   Sulfa Antibiotics Hives   Colistin     Other reaction(s): Other (see comments)   Diclofenac Sodium     Other reaction(s):  Other Analgesics-Anti-inflammatory   Esomeprazole     Other reaction(s): Other (see comments)   Estradiol     Other reaction(s): Other (see comments)   Medroxyprogesterone Acetate     Other reaction(s): Other (see comments) Contraceptives   Neomycin-Polymyxin-Hc     Other reaction(s): Other Eye drops   Nitrofurantoin     Other reaction(s): Other Anti-infectives   Nsaids     Other reaction(s): Other (see comments) Can't tolerate OTC anit-inflammatories-can tolerate advil for a short periods   Prednisone     Other reaction(s): Other (see comments) Eye drop   Proparacaine     Other reaction(s): Other (see comments) Used at eye doctor   Rabeprazole     Other reaction(s): Other (see comments) Ulcer drugs-stomach/chest pain, pasty yellow stools   Amitriptyline     Antidepressants   Aspirin    Azithromycin    Carbocaine [Mepivacaine]    Cimetidine    Ciprofloxacin    Cisapride    Clindamycin/Lincomycin    Cortisporin-Tc [Neomycin-Colist-Hc-Thonzonium]    Escitalopram    Hydrochlorothiazide    Hyoscyamine     Ulcer drugs   Ibuprofen    Iodine    Medroxyprogesterone    Meloxicam    Minocycline     Tetracyclines   Moxifloxacin     Chest pain    Nitrofuran Derivatives    Ofloxacin    Pantoprazole    Penicillins    Pneumococcal Vaccines    Prednisolone    Prevacid [Lansoprazole]    Propoxyphene     -N 100 -Analgesics-opiod   Soap    Spironolactone    Tobradex [Tobramycin-Dexamethasone]    Trolamine Salicylate    Verapamil    Voltaren [Diclofenac]    Diphenhydramine     Other reaction(s): Other (see comments)   Hydrocodone     Other reaction(s): Other (see comments)   Tramadol     Other reaction(s): Other (see comments)    HOME MEDICATIONS: Outpatient Medications Prior to Visit  Medication Sig Dispense Refill   acetaminophen (TYLENOL) 325 MG tablet Take 1-2 tablets (325-650 mg total) by mouth every 4 (four) hours as needed for mild pain.     atorvastatin  (LIPITOR) 80 MG tablet Take 1 tablet (80 mg total) by mouth daily. 30 tablet 0   carvedilol (COREG) 3.125 MG tablet Take 1 tablet (3.125 mg total) by mouth 2 (two) times daily with a meal. 180 tablet 3   clopidogrel (PLAVIX) 75 MG tablet Take 1 tablet (75 mg total) by mouth daily. 8 tablet 0   EUTHYROX 50 MCG tablet Take 50 mcg by mouth daily.     LORazepam (ATIVAN) 0.5 MG tablet Take by mouth.     mirtazapine (REMERON) 7.5 MG tablet Take 1 tablet (7.5 mg total) by mouth at bedtime. 30 tablet 0   Oxcarbazepine (TRILEPTAL) 300 MG tablet Take by mouth.     potassium chloride (KLOR-CON) 10 MEQ tablet Take 1 tablet by mouth daily.     sacubitril-valsartan (ENTRESTO) 24-26 MG Take 1 tablet by mouth 2 (two) times daily. 60 tablet 6   sertraline (ZOLOFT) 25 MG tablet Take 1 tablet (25 mg total) by mouth daily. 30 tablet 0   torsemide (DEMADEX) 20 MG tablet Take 2 tablets (  40 mg total) by mouth daily. 90 tablet 3   colchicine 0.6 MG tablet Take 1 tablet by mouth daily. (Patient not taking: Reported on 04/26/2022)     escitalopram (LEXAPRO) 10 MG tablet Take 1 tablet by mouth daily. (Patient not taking: Reported on 04/26/2022)     gabapentin (NEURONTIN) 600 MG tablet Take by mouth. (Patient not taking: Reported on 04/26/2022)     docusate sodium (COLACE) 100 MG capsule Take 100 mg by mouth 2 (two) times daily.     Omega-3 Fatty Acids (FISH OIL) 1000 MG CAPS Take by mouth.     senna-docusate (SENOKOT-S) 8.6-50 MG tablet Take 1 tablet by mouth daily after supper. (Patient not taking: Reported on 04/09/2022) 30 tablet 0   No facility-administered medications prior to visit.    PAST MEDICAL HISTORY: Past Medical History:  Diagnosis Date   Chronic combined systolic and diastolic CHF (congestive heart failure) (HCC)    Chronic kidney disease (CKD), stage III (moderate) (HCC)    Essential hypertension    Hyperlipidemia    Hypothyroidism    Stroke (HCC)     PAST SURGICAL HISTORY: Past Surgical History:   Procedure Laterality Date   ABDOMINAL HYSTERECTOMY     BACK SURGERY      FAMILY HISTORY: Family History  Problem Relation Age of Onset   Stroke Mother    Heart attack Father     SOCIAL HISTORY: Social History   Socioeconomic History   Marital status: Married    Spouse name: Not on file   Number of children: 3   Years of education: Not on file   Highest education level: Not on file  Occupational History   Not on file  Tobacco Use   Smoking status: Never    Passive exposure: Never   Smokeless tobacco: Never  Vaping Use   Vaping Use: Never used  Substance and Sexual Activity   Alcohol use: Not Currently   Drug use: Never   Sexual activity: Not on file  Other Topics Concern   Not on file  Social History Narrative   04/26/22 Lives with husband, dgtr staying there for now   Social Determinants of Health   Financial Resource Strain: Not on file  Food Insecurity: Not on file  Transportation Needs: Not on file  Physical Activity: Not on file  Stress: Not on file  Social Connections: Not on file  Intimate Partner Violence: Not on file    PHYSICAL EXAM  Vitals:   04/26/22 0959 04/26/22 1222  BP: (!) 98/50 125/70  Pulse: 61   Weight: 133 lb 12.8 oz (60.7 kg)   Height: 5\' 3"  (1.6 m)    Body mass index is 23.7 kg/m.  Generalized: Well developed, in no acute distress  Neurological examination  Mentation: Alert oriented to time, place, history is mostly provided by her daughter. Follows all commands, mild word finding, slight lisp/slur of some words Cranial nerve II-XII: Pupils were equal round reactive to light. Extraocular movements were full, visual field were full on confrontational test. Facial sensation and strength were normal. Head turning and shoulder shrug  were normal and symmetric. Motor: Good strength all extremities, 4/5 right lower extremity  Sensory: Sensory testing is intact to soft touch on all 4 extremities. No evidence of extinction is noted.   Coordination: Cerebellar testing reveals good finger-nose-finger and heel-to-shin bilaterally.  Gait and station: Gait is slightly wide-based, but steady and independent Reflexes: Deep tendon reflexes are symmetric and normal bilaterally.   DIAGNOSTIC DATA (  LABS, IMAGING, TESTING) - I reviewed patient records, labs, notes, testing and imaging myself where available.  Lab Results  Component Value Date   WBC 6.2 03/12/2022   HGB 11.6 (L) 03/12/2022   HCT 33.4 (L) 03/12/2022   MCV 89.1 03/12/2022   PLT 282 03/12/2022      Component Value Date/Time   NA 142 04/19/2022 1239   K 4.3 04/19/2022 1239   CL 99 04/19/2022 1239   CO2 25 04/19/2022 1239   GLUCOSE 98 04/19/2022 1239   GLUCOSE 118 (H) 03/15/2022 1026   BUN 28 (H) 04/19/2022 1239   CREATININE 1.29 (H) 04/19/2022 1239   CALCIUM 9.6 04/19/2022 1239   PROT 6.6 03/08/2022 0551   ALBUMIN 3.7 03/08/2022 0551   AST 56 (H) 03/08/2022 0551   ALT 37 03/08/2022 0551   ALKPHOS 65 03/08/2022 0551   BILITOT 0.5 03/08/2022 0551   GFRNONAA 45 (L) 03/15/2022 1026   Lab Results  Component Value Date   CHOL 278 (H) 03/03/2022   HDL 58 03/03/2022   LDLCALC 198 (H) 03/03/2022   TRIG 112 03/03/2022   CHOLHDL 4.8 03/03/2022   Lab Results  Component Value Date   HGBA1C 6.0 (H) 03/03/2022   No results found for: "VITAMINB12" No results found for: "TSH"  Margie Ege, AGNP-C, DNP 04/26/2022, 12:24 PM [facility] [address] [place], [place] [address] [phone]

## 2022-04-27 NOTE — Progress Notes (Signed)
I agree with the above plan 

## 2022-05-10 ENCOUNTER — Telehealth: Payer: Self-pay | Admitting: Internal Medicine

## 2022-05-10 NOTE — Telephone Encounter (Signed)
*  STAT* If patient is at the pharmacy, call can be transferred to refill team.   1. Which medications need to be refilled? (please list name of each medication and dose if known)  clopidogrel (PLAVIX) 75 MG tablet  2. Which pharmacy/location (including street and city if local pharmacy) is medication to be sent to?[facility] [address], [place] - [address]  3. Do they need a 30 day or 90 day supply? 90 day

## 2022-05-11 MED ORDER — CLOPIDOGREL BISULFATE 75 MG PO TABS: 75.0000 mg | ORAL_TABLET | Freq: Every day | ORAL | 2 refills | Status: DC

## 2022-05-17 ENCOUNTER — Encounter: Payer: Medicare Other | Attending: Registered Nurse | Admitting: Physical Medicine & Rehabilitation

## 2022-05-17 ENCOUNTER — Encounter: Payer: Self-pay | Admitting: Physical Medicine & Rehabilitation

## 2022-05-17 ENCOUNTER — Telehealth (HOSPITAL_COMMUNITY): Payer: Self-pay

## 2022-05-17 VITALS — BP 147/77 | HR 59 | Ht 63.0 in | Wt 133.8 lb

## 2022-05-17 DIAGNOSIS — I6381 Other cerebral infarction due to occlusion or stenosis of small artery: Secondary | ICD-10-CM | POA: Insufficient documentation

## 2022-05-17 NOTE — Telephone Encounter (Signed)
Detailed instructions left on the patient's answer machine. Asked to call back with any questions. S.Quenisha Lovins EMTP 

## 2022-05-17 NOTE — Progress Notes (Signed)
Subjective:    Patient ID: Tamara Bullock, female    DOB: 09-03-1941, 81 y.o.   MRN: [id number] Admit date: 03/07/2022 Discharge date: 03/17/2022  81 y.o. female with history of HTN, CKD, GAD/depression, STM deficits who was admitted on 03/02/22 with sudden onset of aphasia and right sided weakness. Work up revealed left basal ganglia infarct and stroke felt to be embolic from cryptogenic source. Cardiology consulted and recommended outpatient lexiscan to work up global hypokinesis with EF 30-35%. Dr. Pearlean Brownie recommended loop recorder as well as DAPT followed by Plavix indefinitely. Patient continued to be limited by right sided weakness, aphasia, dizziness and balance deficits. CIR recommended due to functional decline.  HPI 82 year old female with history of mild dementia who suffered left CVA and was treated on inpatient rehabilitation.  She was doing well with home health therapy up until about 2 weeks ago Patient had a fall on her right buttocks while attempting to go to the bathroom at night  Seen in urgent care Xrays of back and tailbone reportedly negative  PCP saw pt 3 d ago advised tylenol and advil as well as cold  Overall she is doing better with her mobility and is starting to walk again with a walker but both she and her daughter are feeling that this is a setback.  Pain Inventory Average Pain 8 Pain Right Now 9 My pain is constant and since fall 2.5 weeks ago  LOCATION OF PAIN  back and buttocks  BOWEL Number of stools per week: 7 Oral laxative use Yes  Type of laxative stool softner and miralax  BLADDER Normal   Mobility walk with assistance use a walker ability to climb steps?  yes do you drive?  no  Function retired I need assistance with the following:  meal prep, household duties, and shopping  Neuro/Psych weakness trouble walking confusion depression anxiety  Prior Studies Any changes since last visit?  yes she fell 2.5 weeks ago and back and  buttocks constantly hurt. She did seek treatment and had xrays done  Physicians involved in your care Any changes since last visit?  no   Family History  Problem Relation Age of Onset   Stroke Mother    Heart attack Father    Social History   Socioeconomic History   Marital status: Married    Spouse name: Not on file   Number of children: 3   Years of education: Not on file   Highest education level: Not on file  Occupational History   Not on file  Tobacco Use   Smoking status: Never    Passive exposure: Never   Smokeless tobacco: Never  Vaping Use   Vaping Use: Never used  Substance and Sexual Activity   Alcohol use: Not Currently   Drug use: Never   Sexual activity: Not on file  Other Topics Concern   Not on file  Social History Narrative   04/26/22 Lives with husband, dgtr staying there for now   Social Determinants of Health   Financial Resource Strain: Not on file  Food Insecurity: Not on file  Transportation Needs: Not on file  Physical Activity: Not on file  Stress: Not on file  Social Connections: Not on file   Past Surgical History:  Procedure Laterality Date   ABDOMINAL HYSTERECTOMY     BACK SURGERY     Past Medical History:  Diagnosis Date   Chronic combined systolic and diastolic CHF (congestive heart failure) (HCC)    Chronic kidney  disease (CKD), stage III (moderate) (HCC)    Essential hypertension    Hyperlipidemia    Hypothyroidism    Stroke (HCC)    BP (!) 147/77   Pulse (!) 59   Ht 5\' 3"  (1.6 m) Comment: last recorded  Wt 133 lb 12.8 oz (60.7 kg) Comment: last recorded  SpO2 97%   BMI 23.70 kg/m   Opioid Risk Score:   Fall Risk Score:  `1  Depression screen [facility] 2/9     05/17/2022   11:01 AM 04/04/2022   10:23 AM  Depression screen PHQ 2/9  Decreased Interest 2 2  Down, Depressed, Hopeless 2 1  PHQ - 2 Score 4 3  Altered sleeping  1  Tired, decreased energy  2  Change in appetite  1  Feeling bad or failure about yourself    0  Trouble concentrating  2  Moving slowly or fidgety/restless  2  Suicidal thoughts  0  PHQ-9 Score  11    Review of Systems  Constitutional: Negative.   HENT: Negative.    Eyes: Negative.   Respiratory:  Positive for apnea.   Cardiovascular: Negative.   Gastrointestinal: Negative.   Endocrine: Negative.   Genitourinary: Negative.   Musculoskeletal:  Positive for back pain and gait problem.  Skin: Negative.   Allergic/Immunologic: Negative.   Neurological:  Positive for weakness.  Hematological:  Bruises/bleeds easily.       Plavix  Psychiatric/Behavioral:  Positive for confusion and dysphoric mood. The patient is nervous/anxious.   All other systems reviewed and are negative.      Objective:   Physical Exam Vitals and nursing note reviewed.  Constitutional:      Appearance: She is normal weight.  HENT:     Head: Normocephalic and atraumatic.  Eyes:     Extraocular Movements: Extraocular movements intact.     Conjunctiva/sclera: Conjunctivae normal.     Pupils: Pupils are equal, round, and reactive to light.  Musculoskeletal:     Comments: Mild tenderness to palpation bilateral lumbar paraspinal area  Neurological:     General: No focal deficit present.     Mental Status: She is alert and oriented to person, place, and time.     Comments: Motor strength is 5/5 bilateral deltoid, bicep, tricep, grip, hip flexor, knee extensor, ankle dorsiflexor plantar flexor Negative straight leg raise bilaterally Sensation intact to light touch bilateral lower extremities. Tone is normal bilateral upper and lower limbs Ambulates with a walker no evidence of toe drag or knee instability  Psychiatric:        Mood and Affect: Mood normal.        Behavior: Behavior normal.           Assessment & Plan:  #1.  Left basal ganglia infarct she is doing quite well from stroke recovery standpoint really has no residual neurologic deficits.  Her cognitive issues are longstanding, the  fall has caused some fear with ambulation as well as the low back pain.  Have encouraged her to continue with PT OT coming to the house. We also discussed that the back pain is likely to slowly improve over the course the next 4 to 6 weeks, no red flags noted on examination.  Follow-up physical medicine rehab on as needed basis discussed with patient and the daughter who is with her today .

## 2022-05-17 NOTE — Patient Instructions (Signed)
Please continue therapy, back pain should subside over the next month or 2

## 2022-05-22 ENCOUNTER — Ambulatory Visit (HOSPITAL_COMMUNITY): Payer: Medicare Other | Attending: Cardiology

## 2022-05-22 ENCOUNTER — Ambulatory Visit (HOSPITAL_COMMUNITY): Payer: Medicare Other

## 2022-05-22 ENCOUNTER — Encounter (HOSPITAL_COMMUNITY): Payer: Medicare Other

## 2022-05-22 DIAGNOSIS — I5020 Unspecified systolic (congestive) heart failure: Secondary | ICD-10-CM | POA: Insufficient documentation

## 2022-05-22 LAB — MYOCARDIAL PERFUSION IMAGING
LV dias vol: 43 mL (ref 46–106)
LV sys vol: 11 mL
Nuc Stress EF: 75 %
Peak HR: 92 {beats}/min
Rest HR: 54 {beats}/min
Rest Nuclear Isotope Dose: 10.9 mCi
SDS: 0
SRS: 0
SSS: 0
ST Depression (mm): 0 mm
Stress Nuclear Isotope Dose: 31.5 mCi
TID: 0.93

## 2022-05-22 MED ORDER — REGADENOSON 0.4 MG/5ML IV SOLN
0.4000 mg | Freq: Once | INTRAVENOUS | Status: AC
  Administered 2022-05-22: 0.4 mg via INTRAVENOUS

## 2022-05-22 MED ORDER — TECHNETIUM TC 99M TETROFOSMIN IV KIT
10.9000 | PACK | Freq: Once | INTRAVENOUS | Status: AC | PRN
  Administered 2022-05-22: 10.9 via INTRAVENOUS

## 2022-05-22 MED ORDER — TECHNETIUM TC 99M TETROFOSMIN IV KIT
31.5000 | PACK | Freq: Once | INTRAVENOUS | Status: AC | PRN
  Administered 2022-05-22: 31.5 via INTRAVENOUS

## 2022-06-03 NOTE — Progress Notes (Signed)
Cardiology Office Note:    Date:  06/14/2022   ID:  Tamara Bullock, DOB 01/03/1941, MRN [id number]  PCP:  Tamara Rim, MD  Cardiologist:  Tamara Mayo, MD  Electrophysiologist:  None   Referring MD: Tamara Rim, MD   Chief Complaint: follow-up of CHF  History of Present Illness:    Tamara Bullock is a 81 y.o. female with a history of history of HFrEF with EF of 30-35% on Echo in 03/2022, hypertension, hyperlipidemia, obstructive sleep apnea on CPAP, recent stroke in 03/2022, CKD stage III, and hypothyroidism who is followed by Dr. Phineas Bullock and presents today for hospital follow-up of CHF.   Patient was previously followed by Cardiology at [facility] and [facility]. Prior Myoview in 07/2021 for further evaluation of intermittent chest pain showed no evidence of ischemia or infarction. EF on Myoview was 32% but Echo on same day showed LVEF of 60-65% with grade 2 diastolic dysfunction, mild MR, and moderate TR. Patient was recently admitted at [facility] from 03/02/2022 to 03/07/2022 for an acute basal ganglia stroke after presenting with sudden onset of expressive aphasia and right sided weakness. She was noted to be markedly hypertensive on arrival with systolic BP in the [id number]. Echo showed LVEF of 30-35% with global hypokinesis and mildly elevated RVSP of 36.6 mmHg. She was seen by Cardiology. She was already on Torsemide, Losartan, and Coreg prior to admission but these were all held to allow for permissive hypertension in setting of acute stroke. These were gradually restarted prior to discharge. Amiloride and Clonidine patch were stopped during admission. Plan was for outpatient Event Monitor and Myoview. Event monitor showed no evidence of atrial fibrillation and Myoview showed no evidence of ischemia or prior infarct.  Patient was last seen by me in 03/2022 at which time she was doing well overall. She still had some expressive aphasia from her stroke but it was improving. She  was doing well from a cardiac standpoint. She reported some dyspnea on exertion but this was not new and was stable. She was switched from Losartan to [facility].  Patient presents today for follow-up. Here with daughter. Patient is doing well from a cardiac standpoint. She denies any chest pain, shortness of breath, orthopnea, or PND. She does have some lower extremity edema which is well controlled with Torsemide and compression stockings. No palpitations, lightheadedness, dizziness, or syncope. Her biggest complaint today is back pain. She had a fall about 8 weeks ago and has severe back pain on a daily basis since then. She saw her PCP for this who checked an x-ray which was reportedly okay. This is significantly limiting her activity. Daughter also states she snores loudly and has apneic episodes. She states she has a history of severe sleep apnea but has not had a sleep study in at least 10 years and has not been on a CPAP machine in the last couple of years.   Past Medical History:  Diagnosis Date   Chronic combined systolic and diastolic CHF (congestive heart failure) (HCC)    Chronic kidney disease (CKD), stage III (moderate) (HCC)    Essential hypertension    Hyperlipidemia    Hypothyroidism    Stroke [facility])     Past Surgical History:  Procedure Laterality Date   ABDOMINAL HYSTERECTOMY     BACK SURGERY      Current Medications: Current Meds  Medication Sig   acetaminophen (TYLENOL) 325 MG tablet Take 1-2 tablets (325-650 mg total) by  mouth every 4 (four) hours as needed for mild pain.   atorvastatin (LIPITOR) 80 MG tablet Take 1 tablet (80 mg total) by mouth daily.   carvedilol (COREG) 3.125 MG tablet Take 1 tablet (3.125 mg total) by mouth 2 (two) times daily with a meal.   clopidogrel (PLAVIX) 75 MG tablet Take 1 tablet (75 mg total) by mouth daily.   colchicine 0.6 MG tablet Take 1 tablet by mouth daily.   EUTHYROX 50 MCG tablet Take 50 mcg by mouth daily.   LORazepam (ATIVAN)  0.5 MG tablet Take by mouth.   mirtazapine (REMERON) 7.5 MG tablet Take 1 tablet (7.5 mg total) by mouth at bedtime.   potassium chloride (KLOR-CON) 10 MEQ tablet Take 1 tablet by mouth daily.   sacubitril-valsartan (ENTRESTO) 24-26 MG Take 1 tablet by mouth 2 (two) times daily.   sertraline (ZOLOFT) 25 MG tablet Take 1 tablet (25 mg total) by mouth daily.   torsemide (DEMADEX) 20 MG tablet Take 2 tablets (40 mg total) by mouth daily.     Allergies:   Diphenhydramine hcl, Moxifloxacin hcl in nacl, Penicillin g, Sulfa antibiotics, Colistin, Diclofenac sodium, Esomeprazole, Estradiol, Medroxyprogesterone acetate, Neomycin-polymyxin-hc, Nitrofurantoin, Nsaids, Prednisone, Proparacaine, Rabeprazole, Amitriptyline, Aspirin, Azithromycin, Carbocaine [mepivacaine], Cimetidine, Ciprofloxacin, Cisapride, Clindamycin/lincomycin, Cortisporin-tc [neomycin-colist-hc-thonzonium], Escitalopram, Hydrochlorothiazide, Hyoscyamine, Ibuprofen, Iodine, Medroxyprogesterone, Meloxicam, Minocycline, Moxifloxacin, Nitrofuran derivatives, Ofloxacin, Pantoprazole, Penicillins, Pneumococcal vaccines, Prednisolone, Prevacid [lansoprazole], Propoxyphene, Soap, Spironolactone, Tobradex [tobramycin-dexamethasone], Trolamine salicylate, Verapamil, Voltaren [diclofenac], Diphenhydramine, Hydrocodone, and Tramadol   Social History   Socioeconomic History   Marital status: Married    Spouse name: Not on file   Number of children: 3   Years of education: Not on file   Highest education level: Not on file  Occupational History   Not on file  Tobacco Use   Smoking status: Never    Passive exposure: Never   Smokeless tobacco: Never  Vaping Use   Vaping Use: Never used  Substance and Sexual Activity   Alcohol use: Not Currently   Drug use: Never   Sexual activity: Not on file  Other Topics Concern   Not on file  Social History Narrative   04/26/22 Lives with husband, dgtr staying there for now   Social Determinants of  Health   Financial Resource Strain: Not on file  Food Insecurity: Not on file  Transportation Needs: Not on file  Physical Activity: Not on file  Stress: Not on file  Social Connections: Not on file     Family History: The patient's family history includes Heart attack in her father; Stroke in her mother.  ROS:   Please see the history of present illness.     EKGs/Labs/Other Studies Reviewed:    The following studies were reviewed:  Echocardiogram 07/18/2021 ([facility]): Summary: Normal LV size and systolic function. Estimated EF biplane=60-65%.  Grade II diastolic dysfunction (moderate increase in filling pressures).  Moderate tricuspid valve regurgitation is noted by color. RVSP 58=mmHg, CVP=3. In comparison to echo report on 04/21/19, interval change is noted.  _______________   Celine Ahr 07/18/2021 ([facility]): Impressions: 1. Dyspnea, cough, weakness and overall strange feeling occurred during Lexiscan study.    2.  Nondiagnostic stress Lexiscan EKG changes.  3. Correlation of EKG and perfusion images demonstrate EKG is non-diagnostic with normal perfusion  4. SPECT : Normal perfusion with no evidence of ischemia or infarction.  5. Gated images reveal LVEF = 32%. This represents a abnormal functional finding.  By visual inspection the ejection fraction appears to be better than this number  suggest and recommendation is for an echocardiogram to assess LVEF  6. This is a low risk study by perfusion SPECT analysis 7. Compared to previous report from July 2020, this study reveals no  change in SPECT analysis however in the prior study the ejection fraction left ventricle was 72%.  _______________   Echocardiogram 03/03/2022: Impressions:  1. Left ventricular ejection fraction, by estimation, is 30 to 35%. The  left ventricle has moderately decreased function. The left ventricle  demonstrates global hypokinesis. Left ventricular diastolic parameters are  indeterminate.   2.  Right ventricular systolic function is normal. The right ventricular  size is normal. There is mildly elevated pulmonary artery systolic  pressure.   3. Left atrial size was mildly dilated.   4. Trivial mitral valve regurgitation.   5. Aortic valve regurgitation is not visualized.   6. The inferior vena cava is normal in size with greater than 50%  respiratory variability, suggesting right atrial pressure of 3 mmHg. _______________  Event Monitor 03/30/2022 to 04/28/2022: 3 triggered events. For sinus rhythm. Sinus bradycardia and mild ectopy. No atrial fibrillation. No concerning heart rhythm abnormalities _______________  Myoview 05/22/2022:   The study is normal. The study is low risk.   No ST deviation was noted.   LV perfusion is normal. There is no evidence of ischemia. There is no evidence of infarction.   Left ventricular function is normal. Nuclear stress EF: 75 %. The left ventricular ejection fraction is hyperdynamic (>65%). End diastolic cavity size is normal. End systolic cavity size is normal.   Prior study not available for comparison.   Normal stress nuclear study with no ischemia or infarction.  Gated ejection fraction 75% with normal wall motion.   EKG:  EKG not ordered today.   Recent Labs: 03/03/2022: Magnesium 2.3 03/08/2022: ALT 37 03/12/2022: Hemoglobin 11.6; Platelets 282 04/09/2022: NT-Pro BNP 1,208 04/19/2022: BUN 28; Creatinine, Ser 1.29; Potassium 4.3; Sodium 142  Recent Lipid Panel    Component Value Date/Time   CHOL 278 (H) 03/03/2022 0414   TRIG 112 03/03/2022 0414   HDL 58 03/03/2022 0414   CHOLHDL 4.8 03/03/2022 0414   VLDL 22 03/03/2022 0414   LDLCALC 198 (H) 03/03/2022 0414    Physical Exam:    Vital Signs: BP 104/60 (BP Location: Left Arm, Patient Position: Sitting, Cuff Size: Normal)   Pulse (!) 56   Ht 5\' 3"  (1.6 m)   Wt 125 lb (56.7 kg)   BMI 22.14 kg/m     Wt Readings from Last 3 Encounters:  06/14/22 125 lb (56.7 kg)  05/22/22 134  lb (60.8 kg)  05/17/22 133 lb 12.8 oz (60.7 kg)     General: 81 y.o. Caucasian female in no acute distress. HEENT: Normocephalic and atraumatic. Sclera clear.  Neck: Supple. No carotid bruits. No JVD. Heart: RRR. Distinct S1 and S2. No murmurs, gallops, or rubs. Radial and distal pedal pulses 2+ and equal bilaterally. Lungs: No increased work of breathing. Clear to ausculation bilaterally. No wheezes, rhonchi, or rales.  Abdomen: Soft, non-distended, and non-tender to palpation.  Extremities: Trace lower extremity bilaterally. Skin: Warm and dry. Neuro: Alert and oriented x3. No focal deficits. Psych: Normal affect. Responds appropriately.  Assessment:    1. HFrEF (heart failure with reduced ejection fraction) (HCC)   2. Primary hypertension   3. Hyperlipidemia, unspecified hyperlipidemia type   4. Stage 3a chronic kidney disease (HCC)   5. History of stroke   6. Obstructive sleep apnea syndrome   7.  Back pain, unspecified back location, unspecified back pain laterality, unspecified chronicity     Plan:    HFrEF Echo in 03/2022 during an admission for stroke showed LVEF of 30-35% with global hypokinesis. Myoview in 05/2022 showed LVEF of >65% with no evidence of ischemia or infarction. Etiology felt to possible be due to underlying hypertension. - Euvolemic on exam.  - Continue Torsemide 40mg  daily.  - Continue Entresto 24-26mg  twice daily.  - Continue Coreg 3.125mg  twice daily. - Will recheck BMET today.   Hypertension BP well controlled. - Continue medications for CHF as above.   Hyperlipidemia Lipid panel during admission in 03/2022: Total Cholesterol 278, Triglycerides 112, HDL 58, LDL 198. LDL goal <70 given history of stroke. - Started on Lipitor 80mg  daily during admission in 03/2022. Continue.  - Needs repeat lipid panel and LFTs. She is not fasting today so she will come back for this.   CKD Stage III Baseline creatinine around 1.0 to 1.2. Stable at 1.29 on last  check on 04/19/2022. - Will repeat BMET today.   History of Stroke History of acute basal ganglia stroke in 03/2022. Outpatient monitor showed no evidence of atrial fibrillation. - She was treated with DAPT with Aspirin and Plavix for 21 days at which time Aspirin was stopped. Plan is for Plavix indefinitely.  - Continue high-intensity statin as above.  Obstructive Sleep Apnea Patient's daughter states she has severe sleep apnea and was previously on a CPAP but has not used this in a couple of years. Daughter states she snores loudly and describes apneic episodes.  - Will order sleep study. Daughter requests in lab study so will order split night.  Back Pack Patient only complaint today is back pain. She had a fall about 8 weeks ago and has had persistent back pain since then. This is significantly limited her activity. She reportedly had a x-ray done at PCP's office which looked okay. - Recommended reaching back out to PCP for additional imaging or referral to Ortho.  Disposition: Follow up in 3 months.   Medication Adjustments/Labs and Tests Ordered: Current medicines are reviewed at length with the patient today.  Concerns regarding medicines are outlined above.  Orders Placed This Encounter  Procedures   Basic Metabolic Panel (BMET)   Split night study   No orders of the defined types were placed in this encounter.   Patient Instructions  Medication Instructions:  Your physician recommends that you continue on your current medications as directed. Please refer to the Current Medication list given to you today.  *If you need a refill on your cardiac medications before your next appointment, please call your pharmacy*   Lab Work: Your physician recommends that you have the following lab drawn: BMET   If you have labs (blood work) drawn today and your tests are completely normal, you will receive your results only by: [place] (if you have MyChart) OR A paper copy in the  mail If you have any lab test that is abnormal or we need to change your treatment, we will call you to review the results.   Testing/Procedures: Your physician has recommended that you have a sleep study. This test records several body functions during sleep, including: brain activity, eye movement, oxygen and carbon dioxide blood levels, heart rate and rhythm, breathing rate and rhythm, the flow of air through your mouth and nose, snoring, body muscle movements, and chest and belly movement.    Follow-Up: At [facility], you and your  health needs are our priority.  As part of our continuing mission to provide you with exceptional heart care, we have created designated Provider Care Teams.  These Care Teams include your primary Cardiologist (physician) and Advanced Practice Providers (APPs -  Physician Assistants and Nurse Practitioners) who all work together to provide you with the care you need, when you need it.  We recommend signing up for the patient portal called "MyChart".  Sign up information is provided on this After Visit Summary.  MyChart is used to connect with patients for Virtual Visits (Telemedicine).  Patients are able to view lab/test results, encounter notes, upcoming appointments, etc.  Non-urgent messages can be sent to your provider as well.   To learn more about what you can do with MyChart, go to [email or web address].    Your next appointment:   3 month(s)  The format for your next appointment:   In Person  Provider:   Sande Rives, PA-C       Signed, Darreld Mclean, PA-C  06/14/2022 2:04 PM    [place]

## 2022-06-14 ENCOUNTER — Encounter: Payer: Self-pay | Admitting: Student

## 2022-06-14 ENCOUNTER — Ambulatory Visit: Payer: Medicare Other | Attending: Student | Admitting: Student

## 2022-06-14 VITALS — BP 104/60 | HR 56 | Ht 63.0 in | Wt 125.0 lb

## 2022-06-14 DIAGNOSIS — I10 Essential (primary) hypertension: Secondary | ICD-10-CM | POA: Diagnosis not present

## 2022-06-14 DIAGNOSIS — N1831 Chronic kidney disease, stage 3a: Secondary | ICD-10-CM | POA: Diagnosis not present

## 2022-06-14 DIAGNOSIS — M549 Dorsalgia, unspecified: Secondary | ICD-10-CM

## 2022-06-14 DIAGNOSIS — E785 Hyperlipidemia, unspecified: Secondary | ICD-10-CM | POA: Diagnosis not present

## 2022-06-14 DIAGNOSIS — Z8673 Personal history of transient ischemic attack (TIA), and cerebral infarction without residual deficits: Secondary | ICD-10-CM

## 2022-06-14 DIAGNOSIS — I5020 Unspecified systolic (congestive) heart failure: Secondary | ICD-10-CM

## 2022-06-14 DIAGNOSIS — G4733 Obstructive sleep apnea (adult) (pediatric): Secondary | ICD-10-CM

## 2022-06-14 NOTE — Patient Instructions (Signed)
Medication Instructions:  Your physician recommends that you continue on your current medications as directed. Please refer to the Current Medication list given to you today.  *If you need a refill on your cardiac medications before your next appointment, please call your pharmacy*   Lab Work: Your physician recommends that you have the following lab drawn: BMET   If you have labs (blood work) drawn today and your tests are completely normal, you will receive your results only by: MyChart Message (if you have MyChart) OR A paper copy in the mail If you have any lab test that is abnormal or we need to change your treatment, we will call you to review the results.   Testing/Procedures: Your physician has recommended that you have a sleep study. This test records several body functions during sleep, including: brain activity, eye movement, oxygen and carbon dioxide blood levels, heart rate and rhythm, breathing rate and rhythm, the flow of air through your mouth and nose, snoring, body muscle movements, and chest and belly movement.    Follow-Up: At [facility], you and your health needs are our priority.  As part of our continuing mission to provide you with exceptional heart care, we have created designated Provider Care Teams.  These Care Teams include your primary Cardiologist (physician) and Advanced Practice Providers (APPs -  Physician Assistants and Nurse Practitioners) who all work together to provide you with the care you need, when you need it.  We recommend signing up for the patient portal called "MyChart".  Sign up information is provided on this After Visit Summary.  MyChart is used to connect with patients for Virtual Visits (Telemedicine).  Patients are able to view lab/test results, encounter notes, upcoming appointments, etc.  Non-urgent messages can be sent to your provider as well.   To learn more about what you can do with MyChart, go to [email or web address].     Your next appointment:   3 month(s)  The format for your next appointment:   In Person  Provider:   Marjie Skiff, PA-C

## 2022-06-15 LAB — BASIC METABOLIC PANEL
BUN/Creatinine Ratio: 20 (ref 12–28)
BUN: 30 mg/dL — ABNORMAL HIGH (ref 8–27)
CO2: 27 mmol/L (ref 20–29)
Calcium: 10.3 mg/dL (ref 8.7–10.3)
Chloride: 97 mmol/L (ref 96–106)
Creatinine, Ser: 1.48 mg/dL — ABNORMAL HIGH (ref 0.57–1.00)
Glucose: 99 mg/dL (ref 70–99)
Potassium: 4.5 mmol/L (ref 3.5–5.2)
Sodium: 140 mmol/L (ref 134–144)
eGFR: 35 mL/min/{1.73_m2} — ABNORMAL LOW (ref >59)

## 2022-06-21 ENCOUNTER — Telehealth: Payer: Self-pay | Admitting: Student

## 2022-06-21 MED ORDER — CLOPIDOGREL BISULFATE 75 MG PO TABS: 75.0000 mg | ORAL_TABLET | Freq: Every day | ORAL | 1 refills | Status: AC

## 2022-06-21 MED ORDER — TORSEMIDE 20 MG PO TABS: 40.0000 mg | ORAL_TABLET | Freq: Every day | ORAL | 1 refills | Status: DC

## 2022-06-21 MED ORDER — ENTRESTO 24-26 MG PO TABS: 1.0000 | ORAL_TABLET | Freq: Two times a day (BID) | ORAL | 1 refills | Status: DC

## 2022-06-21 MED ORDER — CARVEDILOL 3.125 MG PO TABS: 3.1250 mg | ORAL_TABLET | Freq: Two times a day (BID) | ORAL | 1 refills | Status: DC

## 2022-06-21 NOTE — Telephone Encounter (Signed)
*  STAT* If patient is at the pharmacy, call can be transferred to refill team.   1. Which medications need to be refilled? (please list name of each medication and dose if known) carvedilol (COREG) 3.125 MG tablet; clopidogrel (PLAVIX) 75 MG tablet; sacubitril-valsartan (ENTRESTO) 24-26 MG; torsemide (DEMADEX) 20 MG tablet  2. Which pharmacy/location (including street and city if local pharmacy) is medication to be sent to? [place], [place] - [address]  3. Do they need a 30 day or 90 day supply? 90 day for all

## 2022-07-18 ENCOUNTER — Ambulatory Visit (HOSPITAL_BASED_OUTPATIENT_CLINIC_OR_DEPARTMENT_OTHER): Payer: Medicare Other | Attending: Student | Admitting: Cardiovascular Disease

## 2022-07-18 DIAGNOSIS — G4733 Obstructive sleep apnea (adult) (pediatric): Secondary | ICD-10-CM | POA: Insufficient documentation

## 2022-07-28 ENCOUNTER — Encounter (HOSPITAL_BASED_OUTPATIENT_CLINIC_OR_DEPARTMENT_OTHER): Payer: Self-pay | Admitting: Cardiovascular Disease

## 2022-07-28 NOTE — Procedures (Signed)
Patient Name: Tamara Bullock, [place] Date: 07/18/2022 Gender: Female D.O.B: 23-Jan-1941 Age (years): 78 Referring Provider: Sande Rives PA-C Height (inches): 63 Interpreting Physician: Shelva Majestic MD, ABSM Weight (lbs): 121 RPSGT: Laren Everts BMI: 21 MRN: [id number] Neck Size: 14.00  CLINICAL INFORMATION The patient is referred for a split night study with BPAP.  MEDICATIONS acetaminophen (TYLENOL) 325 MG tablet atorvastatin (LIPITOR) 80 MG tablet carvedilol (COREG) 3.125 MG tablet clopidogrel (PLAVIX) 75 MG tablet colchicine 0.6 MG tablet EUTHYROX 50 MCG tablet LORazepam (ATIVAN) 0.5 MG tablet mirtazapine (REMERON) 7.5 MG tablet potassium chloride (KLOR-CON) 10 MEQ tablet sacubitril-valsartan (ENTRESTO) 24-26 MG sertraline (ZOLOFT) 25 MG tablet torsemide (DEMADEX) 20 MG tablet Medications self-administered by patient taken the night of the study : LORAZEPAM, MIRTAZAPINE  SLEEP STUDY TECHNIQUE As per the AASM Manual for the Scoring of Sleep and Associated Events v2.3 (April 2016) with a hypopnea requiring 4% desaturations.  The channels recorded and monitored were frontal, central and occipital EEG, electrooculogram (EOG), submentalis EMG (chin), nasal and oral airflow, thoracic and abdominal wall motion, anterior tibialis EMG, snore microphone, electrocardiogram, and pulse oximetry. Bi-level positive airway pressure (BiPAP) was initiated when the patient met split night criteria and was titrated according to treat sleep-disordered breathing.  RESPIRATORY PARAMETERS Diagnostic Total AHI (/hr): 51.0 RDI (/hr): 67.1 OA Index (/hr): - CA Index (/hr): 0.0 REM AHI (/hr): N/A NREM AHI (/hr): 51.0 Supine AHI (/hr): 51.0 Non-supine AHI (/hr): N/A Min O2 Sat (%): 79.0 Mean O2 (%): 92.5 Time below 88% (min): 5.6   Titration Optimal IPAP Pressure (cm): 19 Optimal EPAP Pressure (cm): 15 AHI at Optimal Pressure (/hr): 0 Min O2 at Optimal Pressure (%): 94.0 Sleep % at  Optimal (%): 6 Supine % at Optimal (%): 100   SLEEP ARCHITECTURE The study was initiated at 10:15:18 PM and terminated at 5:02:40 AM. The total recorded time was 407.4 minutes. EEG confirmed total sleep time was 195 minutes yielding a sleep efficiency of 47.9%%. Sleep onset after lights out was 15.7 minutes with a REM latency of N/A minutes. The patient spent 33.6%% of the night in stage N1 sleep, 66.4%% in stage N2 sleep, 0.0%% in stage N3 and 0% in REM. Wake after sleep onset (WASO) was 196.6 minutes. The Arousal Index was 54.5/hour.  LEG MOVEMENT DATA The total Periodic Limb Movements of Sleep (PLMS) were 0. The PLMS index was 0.0 .  CARDIAC DATA The 2 lead EKG demonstrated sinus rhythm. The mean heart rate was 100.0 beats per minute. Other EKG findings include: PACs.  IMPRESSIONS - Severe obstructive sleep apnea occurred during the diagnostic portion of the study (AHI 51.0 /h; RDI 67.1/h, absent REM sleep). CPAP was initiated at 5 cm, titrated to 15 cm and transitioned to BiPAP with optimal PAP pressure was at 19/15 cm of water. (AHI 0, RDI 68.0/h; O2 nadir 94%; absent REM sleep). However, patient complained of excessive pressure. - No significant central sleep apnea occurred during the diagnostic portion of the study (CAI0.0/hour). - Moderate oxygen desaturation was noted during the diagnostic portion of the study to a nadir of 79.0%. - The patient snored with moderate snoring volume during the diagnostic portion of the study. - No cardiac abnormalities were noted during this study. - Clinically significant periodic limb movements of sleep did not occur during the study.  DIAGNOSIS - Obstructive Sleep Apnea (G47.33)  RECOMMENDATIONS - Recommend an initial trial of BiPAP Autotherapy with EPAP min of 13, PS of 4 and IPAP max 25 cm H2O  with heated humidification. An Extra Small size [employer]kel Full Face Evora Full mask was used for the titration. - Effort should be made to optimize nasal  and oropharyngeal patency. - Avoid alcohol, sedatives and other CNS depressants that may worsen sleep apnea and disrupt normal sleep architecture. - Sleep hygiene should be reviewed to assess factors that may improve sleep quality. - Weight management and regular exercise should be initiated or continued. - Recommend a download and sleep clinic evaluation after 4 weeks of therapy.   [Electronically signed] 07/28/2022 05:34 PM  Shelva Majestic MD, [facility], [place], American Board of Sleep Medicine  NPI: [id number]  [place] PH: [phone]   FX: [phone] [place]

## 2022-07-31 ENCOUNTER — Telehealth: Payer: Self-pay | Admitting: *Deleted

## 2022-07-31 NOTE — Telephone Encounter (Signed)
Left message to return a call to discuss sleep study results and recommendations. 

## 2022-08-01 NOTE — Telephone Encounter (Signed)
Patient returned a call to me and was given sleep study results and recommendations. She agrees to proceed with BIPAP treatment. Order will be sent to [place].

## 2022-08-30 ENCOUNTER — Ambulatory Visit: Payer: Medicare Other | Admitting: Neurology

## 2022-10-10 ENCOUNTER — Telehealth: Payer: Self-pay | Admitting: Cardiovascular Disease

## 2022-10-10 NOTE — Telephone Encounter (Signed)
Called patient to schedule sleep compliance appt, spoke to daughter who states that her mother has been unable to wear the mask. The daughter states that she has spent hours and hours trying to explain to her mother how to put it on and wear it but her mother is still having a hard time. She does not see a need to follow up with Dr. Claiborne Billings since her mother has not been using it. I offered her to come in tomorrow to see Dr. Claiborne Billings since he did have an opening but she was unable to bring her in. I advised I would send a message to Dr. Claiborne Billings and Mariann Laster to see if they have any recommendations. Please advise.

## 2022-10-22 DIAGNOSIS — H9113 Presbycusis, bilateral: Secondary | ICD-10-CM | POA: Diagnosis not present

## 2022-10-22 DIAGNOSIS — H903 Sensorineural hearing loss, bilateral: Secondary | ICD-10-CM | POA: Diagnosis not present

## 2022-10-23 ENCOUNTER — Ambulatory Visit: Payer: Medicare Other | Admitting: Neurology

## 2022-10-27 NOTE — Progress Notes (Signed)
Cardiology Office Note:    Date:  11/07/2022   ID:  Tamara Bullock, DOB 05-16-1941, MRN [id number]  PCP:  Vivien Presto, MD  Cardiologist:  Maisie Fus, MD  Electrophysiologist:  None   Referring MD: Vivien Presto, MD   Chief Complaint: follow-up of CHF  History of Present Illness:    Tamara Bullock is a 82 y.o. 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 Carolan Clines and presents today for follow-up of CHF.   Patient was previously followed by Cardiology at [facility] and Vascular. Prior Myoview in 07/2021 for further evaluation of intermittent chest pain showed no evidence of ischemia or infarction. EF on Myoview was 32% but Echo on same day showed LVEF of 60-65% with grade 2 diastolic dysfunction, mild MR, and moderate TR. Patient was recently admitted at [facility] from 03/02/2022 to 03/07/2022 for an acute basal ganglia stroke after presenting with sudden onset of expressive aphasia and right sided weakness. She was noted to be markedly hypertensive on arrival with systolic BP in the 200s. Echo showed LVEF of 30-35% with global hypokinesis and mildly elevated RVSP of 36.6 mmHg. She was seen by Cardiology. She was already on Torsemide, Losartan, and Coreg prior to admission but these were all held to allow for permissive hypertension in setting of acute stroke. These were gradually restarted prior to discharge. Amiloride and Clonidine patch were stopped during admission. Plan was for outpatient Event Monitor and Myoview. Event monitor showed no evidence of atrial fibrillation and Myoview showed no evidence of ischemia or prior infarct.  Patient was last seen by me in 06/2022 at which time she was doing well from a cardiac standpoint. Her biggest complaint at that time was severe back pain after a recent  fall. She was advised to follow-up with PCP or Ortho. Daughter also reported that patient had severe sleep apnea but had not been on CPAP for a couple of years so a repeat sleep study was ordered.  Patient presents today for follow-up. Here with daughter. She is doing well from a cardiac standpoint. She denies any chest pain. She has some shortness of breath if she over exerts herself or walks for longer distances but this is not new and is stable. No shortness of breath at rest. No orthopnea or PND. Her weight is stable. She has lost a lot of weight since her stroke in 03/2022. Daughter states she weighed 160 lbs at the time of her stroke and got as low as 115 lbs. However, her appetite has improved and she has started to gain some weight. She weights 121 lbs today in the office which is still down from 125 lbs at our last office visit. Daughter does describe some apneic episodes. She has sleep apnea but has been unable to tolerate CPAP. No significant lower extremity edema. No palpitations. She describes some mild lightheadedness/dizziness with position changes but this does not last long. No falls or syncope.   Past Medical History:  Diagnosis Date   Chronic combined systolic and diastolic CHF (congestive heart failure) (HCC)    Chronic kidney disease (CKD), stage III (moderate) (HCC)    Essential hypertension    Hyperlipidemia    Hypothyroidism    Stroke [facility])     Past Surgical History:  Procedure Laterality Date   ABDOMINAL  HYSTERECTOMY     BACK SURGERY      Current Medications: Current Meds  Medication Sig   acetaminophen (TYLENOL) 325 MG tablet Take 1-2 tablets (325-650 mg total) by mouth every 4 (four) hours as needed for mild pain.   atorvastatin (LIPITOR) 80 MG tablet Take 1 tablet (80 mg total) by mouth daily.   carvedilol (COREG) 3.125 MG tablet Take 1 tablet (3.125 mg total) by mouth 2 (two) times daily with a meal.   clopidogrel (PLAVIX) 75 MG tablet Take 1 tablet (75 mg total)  by mouth daily.   EUTHYROX 50 MCG tablet Take 50 mcg by mouth daily.   ezetimibe (ZETIA) 10 MG tablet Take 1 tablet (10 mg total) by mouth daily.   LORazepam (ATIVAN) 0.5 MG tablet Take by mouth.   mirtazapine (REMERON) 7.5 MG tablet Take 1 tablet (7.5 mg total) by mouth at bedtime.   potassium chloride (KLOR-CON) 10 MEQ tablet Take 1 tablet by mouth daily.   sacubitril-valsartan (ENTRESTO) 24-26 MG Take 1 tablet by mouth 2 (two) times daily.   sertraline (ZOLOFT) 25 MG tablet Take 1 tablet (25 mg total) by mouth daily.   torsemide (DEMADEX) 20 MG tablet Take 2 tablets (40 mg total) by mouth daily.     Allergies:   Diphenhydramine hcl, Moxifloxacin hcl in nacl, Penicillin g, Sulfa antibiotics, Colistin, Diclofenac sodium, Esomeprazole, Estradiol, Medroxyprogesterone acetate, Neomycin-polymyxin-hc, Nitrofurantoin, Nsaids, Prednisone, Proparacaine, Rabeprazole, Amitriptyline, Aspirin, Azithromycin, Carbocaine [mepivacaine], Cimetidine, Ciprofloxacin, Cisapride, Clindamycin/lincomycin, Cortisporin-tc [neomycin-colist-hc-thonzonium], Escitalopram, Hydrochlorothiazide, Hyoscyamine, Ibuprofen, Iodine, Medroxyprogesterone, Meloxicam, Minocycline, Moxifloxacin, Nitrofuran derivatives, Ofloxacin, Pantoprazole, Penicillins, Pneumococcal vaccines, Prednisolone, Prevacid [lansoprazole], Propoxyphene, Soap, Spironolactone, Tobradex [tobramycin-dexamethasone], Trolamine salicylate, Verapamil, Voltaren [diclofenac], Diphenhydramine, Hydrocodone, and Tramadol   Social History   Socioeconomic History   Marital status: Married    Spouse name: Not on file   Number of children: 3   Years of education: Not on file   Highest education level: Not on file  Occupational History   Not on file  Tobacco Use   Smoking status: Never    Passive exposure: Never   Smokeless tobacco: Never  Vaping Use   Vaping Use: Never used  Substance and Sexual Activity   Alcohol use: Not Currently   Drug use: Never   Sexual  activity: Not on file  Other Topics Concern   Not on file  Social History Narrative   04/26/22 Lives with husband, dgtr staying there for now   Social Determinants of Health   Financial Resource Strain: Not on file  Food Insecurity: Not on file  Transportation Needs: Not on file  Physical Activity: Not on file  Stress: Not on file  Social Connections: Not on file     Family History: The patient's family history includes Heart attack in her father; Stroke in her mother.  ROS:   Please see the history of present illness.     EKGs/Labs/Other Studies Reviewed:    The following studies were reviewed:  Echocardiogram 07/18/2021 ([facility]): Summary: Normal LV size and systolic function. Estimated EF biplane=60-65%.  Grade II diastolic dysfunction (moderate increase in filling pressures).  Moderate tricuspid valve regurgitation is noted by color. RVSP 58=mmHg, CVP=3. In comparison to echo report on 04/21/19, interval change is noted.  _______________   Brantley Fling 07/18/2021 ([facility]): Impressions: 1. Dyspnea, cough, weakness and overall strange feeling occurred during [place] study.    2.  Nondiagnostic stress Lexiscan EKG changes.  3. Correlation of EKG and perfusion images demonstrate EKG is non-diagnostic with normal perfusion  4. SPECT :  Normal perfusion with no evidence of ischemia or infarction.  5. Gated images reveal LVEF = 32%. This represents a abnormal functional finding.  By visual inspection the ejection fraction appears to be better than this number suggest and recommendation is for an echocardiogram to assess LVEF  6. This is a low risk study by perfusion SPECT analysis 7. Compared to previous report from July 2020, this study reveals no  change in SPECT analysis however in the prior study the ejection fraction left ventricle was 72%.  _______________   Echocardiogram 03/03/2022: Impressions:  1. Left ventricular ejection fraction, by estimation, is 30 to 35%. The   left ventricle has moderately decreased function. The left ventricle  demonstrates global hypokinesis. Left ventricular diastolic parameters are  indeterminate.   2. Right ventricular systolic function is normal. The right ventricular  size is normal. There is mildly elevated pulmonary artery systolic  pressure.   3. Left atrial size was mildly dilated.   4. Trivial mitral valve regurgitation.   5. Aortic valve regurgitation is not visualized.   6. The inferior vena cava is normal in size with greater than 50%  respiratory variability, suggesting right atrial pressure of 3 mmHg. _______________   Event Monitor 03/30/2022 to 04/28/2022: 3 triggered events. For sinus rhythm. Sinus bradycardia and mild ectopy. No atrial fibrillation. No concerning heart rhythm abnormalities _______________   Myoview 05/22/2022:   The study is normal. The study is low risk.   No ST deviation was noted.   LV perfusion is normal. There is no evidence of ischemia. There is no evidence of infarction.   Left ventricular function is normal. Nuclear stress EF: 75 %. The left ventricular ejection fraction is hyperdynamic (>65%). End diastolic cavity size is normal. End systolic cavity size is normal.   Prior study not available for comparison.   Normal stress nuclear study with no ischemia or infarction.  Gated ejection fraction 75% with normal wall motion.  EKG:  EKG not ordered today.   Recent Labs: 03/03/2022: Magnesium 2.3 03/08/2022: ALT 37 03/12/2022: Hemoglobin 11.6; Platelets 282 04/09/2022: NT-Pro BNP 1,208 06/14/2022: BUN 30; Creatinine, Ser 1.48; Potassium 4.5; Sodium 140  Recent Lipid Panel    Component Value Date/Time   CHOL 278 (H) 03/03/2022 0414   TRIG 112 03/03/2022 0414   HDL 58 03/03/2022 0414   CHOLHDL 4.8 03/03/2022 0414   VLDL 22 03/03/2022 0414   LDLCALC 198 (H) 03/03/2022 0414    Physical Exam:    Vital Signs: BP (!) 136/58 (BP Location: Left Arm, Patient Position: Sitting)   Pulse  (!) 53   Ht 5\' 3"  (1.6 m)   Wt 121 lb 12.8 oz (55.2 kg)   SpO2 98%   BMI 21.58 kg/m     Wt Readings from Last 3 Encounters:  11/07/22 121 lb 12.8 oz (55.2 kg)  07/18/22 121 lb (54.9 kg)  06/14/22 125 lb (56.7 kg)     General: 82 y.o. Caucasian female in no acute distress. HEENT: Normocephalic and atraumatic. Sclera clear.  Neck: Supple. No carotid bruits. No JVD. Heart: RRR. Distinct S1 and S2. No murmurs, gallops, or rubs. Radial pulses 2+ and equal bilaterally. Lungs: No increased work of breathing. Clear to ausculation bilaterally. No wheezes, rhonchi, or rales.  Abdomen: Soft, non-distended, and non-tender to palpation.  Extremities: No to trace lower extremity edema.    Skin: Warm and dry. Neuro: Alert and oriented x3. No focal deficits. Psych: Normal affect. Responds appropriately.   Assessment:    1.  Chronic HFrEF (heart failure with reduced ejection fraction) (HCC)   2. Primary hypertension   3. Hyperlipidemia, unspecified hyperlipidemia type   4. Stage 3b chronic kidney disease (HCC)   5. History of stroke   6. Obstructive sleep apnea     Plan:    Chronic HFrEF Echo in 03/2022 during an admission for stroke showed LVEF of 30-35% with global hypokinesis. Myoview in 05/2022 showed LVEF of >65% with no evidence of ischemia or infarction. Etiology felt to possible be due to underlying hypertension. - Euvolemic on exam.  - Continue Torsemide 40mg  daily (and KCl 10 mEq daily).  - Continue Entresto 24-26mg  twice daily.  - Continue Coreg 3.125mg  twice daily. - Discussed importance of daily weight and sodium restrictions.    Hypertension BP borderline elevated. BP 138/58 in the office today (I personally confirmed this as well). - Continue medications for CHF as above. - Advised patient to monitor BP at home and let know if consistently >130s/80s. She describes some mild orthostatic symptoms. Given this and advanced age, I am hesitant to be overly aggressive in  treating her BP.   Hyperlipidemia Last lipid panel in 08/2022 at PCP's office: Total Cholesterol 177, Triglycerides 85, HDL 69, LDL 92. LDL goal <70 given history of stroke. - Continue Lipitor 80mg  daily.  - Will start Zetia 10mg  daily. - Will repeat lipid panel and LFTs in 3 months.   CKD Stage III Baseline creatinine around 1.0 to 1.3. Creatinine stable at 1.22 on last labs in 08/2022 (in Care Everywhere).   History of Stroke History of acute basal ganglia stroke in 03/2022. Outpatient monitor showed no evidence of atrial fibrillation. - She was treated with DAPT with Aspirin and Plavix for 21 days at which time Aspirin was stopped. Plan is for Plavix indefinitely.  - Continue high-intensity statin as above.   Obstructive Sleep Apnea She has known sleep apnea but has been unable to tolerate the CPAP mask.  Disposition: Follow up in 4-6 months.   Medication Adjustments/Labs and Tests Ordered: Current medicines are reviewed at length with the patient today.  Concerns regarding medicines are outlined above.  Orders Placed This Encounter  Procedures   Lipid panel   Hepatic function panel   Meds ordered this encounter  Medications   ezetimibe (ZETIA) 10 MG tablet    Sig: Take 1 tablet (10 mg total) by mouth daily.    Dispense:  90 tablet    Refill:  3    Patient Instructions  Medication Instructions:   START Zetia 10 mg daily   *If you need a refill on your cardiac medications before your next appointment, please call your pharmacy*  Lab Work: Your physician recommends that you return for lab work in 3 months (02/05/23):  Fasting Lipid Panel-DO NOT eat or drink past midnight okay to have water and/or black coffee only Hepatic (Liver) Function Test  If you have labs (blood work) drawn today and your tests are completely normal, you will receive your results only by: MyChart Message (if you have MyChart) OR A paper copy in the mail If you have any lab test that is  abnormal or we need to change your treatment, we will call you to review the results.  Testing/Procedures: NONE ordered at this time of appointment   Follow-Up: At [facility], you and your health needs are our priority.  As part of our continuing mission to provide you with exceptional heart care, we have created designated Provider Care Teams.  These Care Teams include your primary Cardiologist (physician) and Advanced Practice Providers (APPs -  Physician Assistants and Nurse Practitioners) who all work together to provide you with the care you need, when you need it.  Your next appointment:   4 month(s) end of June   Provider:   Sande Rives, PA-C        Other Instructions Heart Failure Education: Weigh yourself EVERY morning after you go to the bathroom but before you eat or drink anything. Write this number down in a weight log/diary. If you gain 3 pounds overnight or 5 pounds in a week, call the office. Take your medicines as prescribed. If you have concerns about your medications, please call us before you stop taking them.  Eat low salt foods--Limit salt (sodium) to 2000 mg per day. This will help prevent your body from holding onto fluid. Read food labels as many processed foods have a lot of sodium, especially canned goods and prepackaged meats. If you would like some assistance choosing low sodium foods, we would be happy to set you up with a nutritionist. Stay as active as you can everyday. Staying active will give you more energy and make your muscles stronger. Start with 5 minutes at a time and work your way up to 30 minutes a day. Break up your activities--do some in the morning and some in the afternoon. Start with 3 days per week and work your way up to 5 days as you can.  If you have chest pain, feel short of breath, dizzy, or lightheaded, STOP. If you don't feel better after a short rest, call 911. If you do feel better, call the office to let us know you have  symptoms with exercise. Limit all fluids for the day to less than 2 liters. Fluid includes all drinks, coffee, juice, ice chips, soup, jello, and all other liquids.     Signed, Darreld Mclean, PA-C  11/07/2022 12:11 PM     [facility]

## 2022-11-07 ENCOUNTER — Ambulatory Visit: Payer: Medicare Other | Attending: Student | Admitting: Student

## 2022-11-07 ENCOUNTER — Encounter: Payer: Self-pay | Admitting: Student

## 2022-11-07 VITALS — BP 136/58 | HR 53 | Ht 63.0 in | Wt 121.8 lb

## 2022-11-07 DIAGNOSIS — Z8673 Personal history of transient ischemic attack (TIA), and cerebral infarction without residual deficits: Secondary | ICD-10-CM

## 2022-11-07 DIAGNOSIS — G4733 Obstructive sleep apnea (adult) (pediatric): Secondary | ICD-10-CM

## 2022-11-07 DIAGNOSIS — I5022 Chronic systolic (congestive) heart failure: Secondary | ICD-10-CM

## 2022-11-07 DIAGNOSIS — N1832 Chronic kidney disease, stage 3b: Secondary | ICD-10-CM | POA: Diagnosis not present

## 2022-11-07 DIAGNOSIS — E785 Hyperlipidemia, unspecified: Secondary | ICD-10-CM | POA: Diagnosis not present

## 2022-11-07 DIAGNOSIS — I10 Essential (primary) hypertension: Secondary | ICD-10-CM

## 2022-11-07 MED ORDER — EZETIMIBE 10 MG PO TABS: 10.0000 mg | ORAL_TABLET | Freq: Every day | ORAL | 3 refills | Status: AC

## 2022-11-07 NOTE — Patient Instructions (Addendum)
Medication Instructions:   START Zetia 10 mg daily   *If you need a refill on your cardiac medications before your next appointment, please call your pharmacy*  Lab Work: Your physician recommends that you return for lab work in 3 months (02/05/23):  Fasting Lipid Panel-DO NOT eat or drink past midnight okay to have water and/or black coffee only Hepatic (Liver) Function Test  If you have labs (blood work) drawn today and your tests are completely normal, you will receive your results only by: MyChart Message (if you have MyChart) OR A paper copy in the mail If you have any lab test that is abnormal or we need to change your treatment, we will call you to review the results.  Testing/Procedures: NONE ordered at this time of appointment   Follow-Up: At [facility], you and your health needs are our priority.  As part of our continuing mission to provide you with exceptional heart care, we have created designated Provider Care Teams.  These Care Teams include your primary Cardiologist (physician) and Advanced Practice Providers (APPs -  Physician Assistants and Nurse Practitioners) who all work together to provide you with the care you need, when you need it.  Your next appointment:   4 month(s) end of June   Provider:   Sande Rives, PA-C        Other Instructions Heart Failure Education: Weigh yourself EVERY morning after you go to the bathroom but before you eat or drink anything. Write this number down in a weight log/diary. If you gain 3 pounds overnight or 5 pounds in a week, call the office. Take your medicines as prescribed. If you have concerns about your medications, please call us before you stop taking them.  Eat low salt foods--Limit salt (sodium) to 2000 mg per day. This will help prevent your body from holding onto fluid. Read food labels as many processed foods have a lot of sodium, especially canned goods and prepackaged meats. If you would like some  assistance choosing low sodium foods, we would be happy to set you up with a nutritionist. Stay as active as you can everyday. Staying active will give you more energy and make your muscles stronger. Start with 5 minutes at a time and work your way up to 30 minutes a day. Break up your activities--do some in the morning and some in the afternoon. Start with 3 days per week and work your way up to 5 days as you can.  If you have chest pain, feel short of breath, dizzy, or lightheaded, STOP. If you don't feel better after a short rest, call 911. If you do feel better, call the office to let us know you have symptoms with exercise. Limit all fluids for the day to less than 2 liters. Fluid includes all drinks, coffee, juice, ice chips, soup, jello, and all other liquids.

## 2022-12-25 ENCOUNTER — Other Ambulatory Visit: Payer: Self-pay | Admitting: *Deleted

## 2022-12-25 MED ORDER — CARVEDILOL 3.125 MG PO TABS: 3.1250 mg | ORAL_TABLET | Freq: Two times a day (BID) | ORAL | 3 refills | Status: AC

## 2023-01-13 ENCOUNTER — Inpatient Hospital Stay (HOSPITAL_COMMUNITY)
Admission: EM | Admit: 2023-01-13 | Discharge: 2023-01-18 | DRG: 481 | Disposition: A | Discharge status: Skilled Nursing Facility | Payer: Medicare Other | Attending: Internal Medicine | Admitting: Internal Medicine

## 2023-01-13 ENCOUNTER — Encounter (HOSPITAL_COMMUNITY): Payer: Self-pay

## 2023-01-13 ENCOUNTER — Other Ambulatory Visit: Payer: Self-pay

## 2023-01-13 ENCOUNTER — Emergency Department (HOSPITAL_COMMUNITY): Payer: Medicare Other

## 2023-01-13 DIAGNOSIS — I639 Cerebral infarction, unspecified: Secondary | ICD-10-CM | POA: Diagnosis present

## 2023-01-13 DIAGNOSIS — I130 Hypertensive heart and chronic kidney disease with heart failure and stage 1 through stage 4 chronic kidney disease, or unspecified chronic kidney disease: Secondary | ICD-10-CM | POA: Diagnosis present

## 2023-01-13 DIAGNOSIS — I69319 Unspecified symptoms and signs involving cognitive functions following cerebral infarction: Secondary | ICD-10-CM

## 2023-01-13 DIAGNOSIS — Z882 Allergy status to sulfonamides status: Secondary | ICD-10-CM

## 2023-01-13 DIAGNOSIS — D72829 Elevated white blood cell count, unspecified: Secondary | ICD-10-CM | POA: Diagnosis present

## 2023-01-13 DIAGNOSIS — N1831 Chronic kidney disease, stage 3a: Secondary | ICD-10-CM | POA: Diagnosis present

## 2023-01-13 DIAGNOSIS — E785 Hyperlipidemia, unspecified: Secondary | ICD-10-CM | POA: Diagnosis present

## 2023-01-13 DIAGNOSIS — D631 Anemia in chronic kidney disease: Secondary | ICD-10-CM | POA: Diagnosis present

## 2023-01-13 DIAGNOSIS — I959 Hypotension, unspecified: Secondary | ICD-10-CM | POA: Diagnosis not present

## 2023-01-13 DIAGNOSIS — E875 Hyperkalemia: Secondary | ICD-10-CM | POA: Diagnosis not present

## 2023-01-13 DIAGNOSIS — N3289 Other specified disorders of bladder: Secondary | ICD-10-CM | POA: Diagnosis present

## 2023-01-13 DIAGNOSIS — Z880 Allergy status to penicillin: Secondary | ICD-10-CM

## 2023-01-13 DIAGNOSIS — N132 Hydronephrosis with renal and ureteral calculous obstruction: Secondary | ICD-10-CM | POA: Diagnosis present

## 2023-01-13 DIAGNOSIS — Z881 Allergy status to other antibiotic agents status: Secondary | ICD-10-CM

## 2023-01-13 DIAGNOSIS — Z888 Allergy status to other drugs, medicaments and biological substances status: Secondary | ICD-10-CM

## 2023-01-13 DIAGNOSIS — Z8249 Family history of ischemic heart disease and other diseases of the circulatory system: Secondary | ICD-10-CM

## 2023-01-13 DIAGNOSIS — I10 Essential (primary) hypertension: Secondary | ICD-10-CM | POA: Diagnosis present

## 2023-01-13 DIAGNOSIS — E039 Hypothyroidism, unspecified: Secondary | ICD-10-CM | POA: Diagnosis present

## 2023-01-13 DIAGNOSIS — Z79899 Other long term (current) drug therapy: Secondary | ICD-10-CM | POA: Diagnosis not present

## 2023-01-13 DIAGNOSIS — N179 Acute kidney failure, unspecified: Secondary | ICD-10-CM | POA: Diagnosis not present

## 2023-01-13 DIAGNOSIS — F32A Depression, unspecified: Secondary | ICD-10-CM | POA: Diagnosis present

## 2023-01-13 DIAGNOSIS — F0393 Unspecified dementia, unspecified severity, with mood disturbance: Secondary | ICD-10-CM | POA: Diagnosis present

## 2023-01-13 DIAGNOSIS — M80051A Age-related osteoporosis with current pathological fracture, right femur, initial encounter for fracture: Principal | ICD-10-CM | POA: Diagnosis present

## 2023-01-13 DIAGNOSIS — Z90710 Acquired absence of both cervix and uterus: Secondary | ICD-10-CM

## 2023-01-13 DIAGNOSIS — Z885 Allergy status to narcotic agent status: Secondary | ICD-10-CM

## 2023-01-13 DIAGNOSIS — Z823 Family history of stroke: Secondary | ICD-10-CM

## 2023-01-13 DIAGNOSIS — Y92009 Unspecified place in unspecified non-institutional (private) residence as the place of occurrence of the external cause: Secondary | ICD-10-CM | POA: Diagnosis not present

## 2023-01-13 DIAGNOSIS — M4854XS Collapsed vertebra, not elsewhere classified, thoracic region, sequela of fracture: Secondary | ICD-10-CM | POA: Diagnosis present

## 2023-01-13 DIAGNOSIS — Z886 Allergy status to analgesic agent status: Secondary | ICD-10-CM

## 2023-01-13 DIAGNOSIS — I5042 Chronic combined systolic (congestive) and diastolic (congestive) heart failure: Secondary | ICD-10-CM | POA: Diagnosis present

## 2023-01-13 DIAGNOSIS — E871 Hypo-osmolality and hyponatremia: Secondary | ICD-10-CM | POA: Diagnosis not present

## 2023-01-13 DIAGNOSIS — G4733 Obstructive sleep apnea (adult) (pediatric): Secondary | ICD-10-CM | POA: Diagnosis present

## 2023-01-13 DIAGNOSIS — D62 Acute posthemorrhagic anemia: Secondary | ICD-10-CM | POA: Diagnosis not present

## 2023-01-13 DIAGNOSIS — W19XXXA Unspecified fall, initial encounter: Principal | ICD-10-CM

## 2023-01-13 DIAGNOSIS — F0394 Unspecified dementia, unspecified severity, with anxiety: Secondary | ICD-10-CM | POA: Diagnosis present

## 2023-01-13 DIAGNOSIS — Z9109 Other allergy status, other than to drugs and biological substances: Secondary | ICD-10-CM

## 2023-01-13 DIAGNOSIS — D638 Anemia in other chronic diseases classified elsewhere: Secondary | ICD-10-CM | POA: Diagnosis not present

## 2023-01-13 DIAGNOSIS — E876 Hypokalemia: Secondary | ICD-10-CM | POA: Diagnosis present

## 2023-01-13 DIAGNOSIS — S72141A Displaced intertrochanteric fracture of right femur, initial encounter for closed fracture: Secondary | ICD-10-CM | POA: Diagnosis present

## 2023-01-13 DIAGNOSIS — W010XXA Fall on same level from slipping, tripping and stumbling without subsequent striking against object, initial encounter: Secondary | ICD-10-CM | POA: Diagnosis present

## 2023-01-13 DIAGNOSIS — Z7902 Long term (current) use of antithrombotics/antiplatelets: Secondary | ICD-10-CM

## 2023-01-13 DIAGNOSIS — E034 Atrophy of thyroid (acquired): Secondary | ICD-10-CM | POA: Diagnosis not present

## 2023-01-13 DIAGNOSIS — S72001A Fracture of unspecified part of neck of right femur, initial encounter for closed fracture: Secondary | ICD-10-CM | POA: Diagnosis not present

## 2023-01-13 LAB — CBC
HCT: 31.3 % — ABNORMAL LOW (ref 36.0–46.0)
Hemoglobin: 10.8 g/dL — ABNORMAL LOW (ref 12.0–15.0)
MCH: 30.9 pg (ref 26.0–34.0)
MCHC: 34.5 g/dL (ref 30.0–36.0)
MCV: 89.4 fL (ref 80.0–100.0)
Platelets: 247 10*3/uL (ref 150–400)
RBC: 3.5 MIL/uL — ABNORMAL LOW (ref 3.87–5.11)
RDW: 12.9 % (ref 11.5–15.5)
WBC: 10.1 10*3/uL (ref 4.0–10.5)
nRBC: 0 % (ref 0.0–0.2)

## 2023-01-13 LAB — URINALYSIS, ROUTINE W REFLEX MICROSCOPIC
Bilirubin Urine: NEGATIVE
Glucose, UA: NEGATIVE mg/dL
Ketones, ur: NEGATIVE mg/dL
Nitrite: NEGATIVE
Protein, ur: NEGATIVE mg/dL
Specific Gravity, Urine: 1.011 (ref 1.005–1.030)
pH: 7 (ref 5.0–8.0)

## 2023-01-13 LAB — COMPREHENSIVE METABOLIC PANEL
ALT: 22 U/L (ref 0–44)
AST: 33 U/L (ref 15–41)
Albumin: 4.1 g/dL (ref 3.5–5.0)
Alkaline Phosphatase: 63 U/L (ref 38–126)
Anion gap: 18 — ABNORMAL HIGH (ref 5–15)
BUN: 30 mg/dL — ABNORMAL HIGH (ref 8–23)
CO2: 24 mmol/L (ref 22–32)
Calcium: 9.2 mg/dL (ref 8.9–10.3)
Chloride: 97 mmol/L — ABNORMAL LOW (ref 98–111)
Creatinine, Ser: 1.43 mg/dL — ABNORMAL HIGH (ref 0.44–1.00)
GFR, Estimated: 36 mL/min — ABNORMAL LOW (ref >60)
Glucose, Bld: 137 mg/dL — ABNORMAL HIGH (ref 70–99)
Potassium: 3.6 mmol/L (ref 3.5–5.1)
Sodium: 139 mmol/L (ref 135–145)
Total Bilirubin: 1 mg/dL (ref 0.3–1.2)
Total Protein: 6.8 g/dL (ref 6.5–8.1)

## 2023-01-13 LAB — LACTIC ACID, PLASMA: Lactic Acid, Venous: 2.4 mmol/L (ref 0.5–1.9)

## 2023-01-13 LAB — I-STAT CHEM 8, ED
BUN: 31 mg/dL — ABNORMAL HIGH (ref 8–23)
Calcium, Ion: 1.1 mmol/L — ABNORMAL LOW (ref 1.15–1.40)
Chloride: 103 mmol/L (ref 98–111)
Creatinine, Ser: 1.3 mg/dL — ABNORMAL HIGH (ref 0.44–1.00)
Glucose, Bld: 126 mg/dL — ABNORMAL HIGH (ref 70–99)
HCT: 34 % — ABNORMAL LOW (ref 36.0–46.0)
Hemoglobin: 11.6 g/dL — ABNORMAL LOW (ref 12.0–15.0)
Potassium: 3.4 mmol/L — ABNORMAL LOW (ref 3.5–5.1)
Sodium: 138 mmol/L (ref 135–145)
TCO2: 26 mmol/L (ref 22–32)

## 2023-01-13 LAB — PROTIME-INR
INR: 1.2 (ref 0.8–1.2)
Prothrombin Time: 15.5 seconds — ABNORMAL HIGH (ref 11.4–15.2)

## 2023-01-13 LAB — SAMPLE TO BLOOD BANK

## 2023-01-13 LAB — ETHANOL: Alcohol, Ethyl (B): <10 mg/dL (ref <10)

## 2023-01-13 MED ORDER — CARVEDILOL 3.125 MG PO TABS
3.1250 mg | ORAL_TABLET | Freq: Two times a day (BID) | ORAL | Status: DC
  Administered 2023-01-13 – 2023-01-18 (×9): 3.125 mg via ORAL
  Filled 2023-01-13 (×9): qty 1

## 2023-01-13 MED ORDER — FENTANYL CITRATE PF 50 MCG/ML IJ SOSY
25.0000 ug | PREFILLED_SYRINGE | Freq: Once | INTRAMUSCULAR | Status: AC
  Administered 2023-01-13: 25 ug via INTRAVENOUS
  Filled 2023-01-13: qty 1

## 2023-01-13 MED ORDER — ONDANSETRON HCL 4 MG/2ML IJ SOLN: 4.0000 mg | Freq: Once | INTRAMUSCULAR | Status: AC

## 2023-01-13 MED ORDER — ONDANSETRON HCL 4 MG/2ML IJ SOLN
4.0000 mg | Freq: Three times a day (TID) | INTRAMUSCULAR | Status: DC | PRN
  Administered 2023-01-14 – 2023-01-17 (×2): 4 mg via INTRAVENOUS
  Filled 2023-01-13: qty 2

## 2023-01-13 MED ORDER — LEVOTHYROXINE SODIUM 50 MCG PO TABS
50.0000 ug | ORAL_TABLET | Freq: Every day | ORAL | Status: DC
  Administered 2023-01-14 – 2023-01-18 (×5): 50 ug via ORAL
  Filled 2023-01-13 (×5): qty 1

## 2023-01-13 MED ORDER — MIRTAZAPINE 15 MG PO TABS
7.5000 mg | ORAL_TABLET | Freq: Every day | ORAL | Status: DC
  Administered 2023-01-13 – 2023-01-17 (×5): 7.5 mg via ORAL
  Filled 2023-01-13 (×5): qty 1

## 2023-01-13 MED ORDER — POLYETHYLENE GLYCOL 3350 17 G PO PACK: 17.0000 g | PACK | Freq: Every day | ORAL | Status: DC | PRN

## 2023-01-13 MED ORDER — LORAZEPAM 0.5 MG PO TABS
0.5000 mg | ORAL_TABLET | Freq: Every day | ORAL | Status: DC | PRN
  Administered 2023-01-14 – 2023-01-17 (×3): 0.5 mg via ORAL
  Filled 2023-01-13 (×4): qty 1

## 2023-01-13 MED ORDER — EZETIMIBE 10 MG PO TABS
10.0000 mg | ORAL_TABLET | Freq: Every day | ORAL | Status: DC
  Administered 2023-01-14 – 2023-01-18 (×5): 10 mg via ORAL
  Filled 2023-01-13 (×5): qty 1

## 2023-01-13 MED ORDER — ACETAMINOPHEN 325 MG PO TABS
650.0000 mg | ORAL_TABLET | Freq: Four times a day (QID) | ORAL | Status: DC | PRN
  Administered 2023-01-13 – 2023-01-14 (×2): 650 mg via ORAL
  Filled 2023-01-13 (×2): qty 2

## 2023-01-13 MED ORDER — ONDANSETRON HCL 4 MG/2ML IJ SOLN
INTRAMUSCULAR | Status: AC
  Administered 2023-01-13: 4 mg via INTRAVENOUS
  Filled 2023-01-13: qty 2

## 2023-01-13 MED ORDER — FENTANYL CITRATE PF 50 MCG/ML IJ SOSY: 25.0000 ug | PREFILLED_SYRINGE | INTRAMUSCULAR | Status: DC | PRN

## 2023-01-13 MED ORDER — FENTANYL CITRATE PF 50 MCG/ML IJ SOSY
25.0000 ug | PREFILLED_SYRINGE | INTRAMUSCULAR | Status: DC | PRN
  Administered 2023-01-13 – 2023-01-15 (×6): 25 ug via INTRAVENOUS
  Filled 2023-01-13 (×6): qty 1

## 2023-01-13 MED ORDER — ATORVASTATIN CALCIUM 80 MG PO TABS
80.0000 mg | ORAL_TABLET | Freq: Every day | ORAL | Status: DC
  Administered 2023-01-14 – 2023-01-18 (×5): 80 mg via ORAL
  Filled 2023-01-13 (×5): qty 1

## 2023-01-13 MED ORDER — ONDANSETRON HCL 4 MG/2ML IJ SOLN
4.0000 mg | Freq: Once | INTRAMUSCULAR | Status: AC
  Administered 2023-01-13: 4 mg via INTRAVENOUS
  Filled 2023-01-13: qty 2

## 2023-01-13 MED ORDER — METHOCARBAMOL 1000 MG/10ML IJ SOLN
500.0000 mg | Freq: Three times a day (TID) | INTRAVENOUS | Status: DC | PRN
  Administered 2023-01-13: 500 mg via INTRAVENOUS
  Filled 2023-01-13: qty 500
  Filled 2023-01-13: qty 5

## 2023-01-13 MED ORDER — IOHEXOL 350 MG/ML SOLN
75.0000 mL | Freq: Once | INTRAVENOUS | Status: AC | PRN
  Administered 2023-01-13: 75 mL via INTRAVENOUS

## 2023-01-13 MED ORDER — SERTRALINE HCL 25 MG PO TABS
25.0000 mg | ORAL_TABLET | Freq: Every day | ORAL | Status: DC
  Administered 2023-01-14 – 2023-01-18 (×5): 25 mg via ORAL
  Filled 2023-01-13 (×5): qty 1

## 2023-01-13 MED ORDER — SACUBITRIL-VALSARTAN 24-26 MG PO TABS
1.0000 | ORAL_TABLET | Freq: Two times a day (BID) | ORAL | Status: DC
  Administered 2023-01-13 – 2023-01-15 (×4): 1 via ORAL
  Filled 2023-01-13 (×4): qty 1

## 2023-01-13 MED ORDER — FENTANYL CITRATE PF 50 MCG/ML IJ SOSY
50.0000 ug | PREFILLED_SYRINGE | Freq: Once | INTRAMUSCULAR | Status: AC
  Administered 2023-01-13: 50 ug via INTRAVENOUS
  Filled 2023-01-13: qty 1

## 2023-01-13 MED ORDER — TORSEMIDE 20 MG PO TABS
40.0000 mg | ORAL_TABLET | Freq: Every day | ORAL | Status: DC
  Administered 2023-01-14 – 2023-01-15 (×2): 40 mg via ORAL
  Filled 2023-01-13 (×2): qty 2

## 2023-01-13 NOTE — Progress Notes (Signed)
Orthopedic Tech Progress Note Patient Details:  Tamara Bullock 02/02/1941 [id number]  Level II trauma, ortho tech services not required at this moment.  Patient ID: AVEE [name], female   DOB: 1940/12/22, 82 y.o.   MRN: [id number]  Docia Furl 01/13/2023, 12:41 PM

## 2023-01-13 NOTE — ED Notes (Signed)
Back from CT

## 2023-01-13 NOTE — ED Provider Notes (Signed)
[facility]Y [facility]PART[facility] AT [facility] Provider Note   CSN: [id number] Arrival date & time: 01/13/23  1219     History  Chief Complaint  Patient presents with   Level 2 fall/?FEMUR    Tamara Bullock is a 82 y.o. female.  HPI   82 year old female with medical history significant for HTN, HLD, CHF, CVA, CKD, hypothyroidism, reported history of dementia per EMS who presents to the emergency department after a fall from standing.  Presented as a level 2 trauma after a fall due to the patient being on Plavix.  She was found to have a deformity of the proximal right femur with shortening and external rotation, was neurovascularly intact, no other signs of trauma.  The patient denied any head trauma or loss of consciousness.  She arrives to the emergency department GCS 14, ABC intact, obvious deformity to the right lower extremity present, pulses intact 2+ in the right DP pulse.  Home Medications Prior to Admission medications   Medication Sig Start Date End Date Taking? Authorizing Provider  acetaminophen (TYLENOL) 325 MG tablet Take 1-2 tablets (325-650 mg total) by mouth every 4 (four) hours as needed for mild pain. 03/12/22   Love, Evlyn Kanner, PA-C  atorvastatin (LIPITOR) 80 MG tablet Take 1 tablet (80 mg total) by mouth daily. 03/15/22   Love, Evlyn Kanner, PA-C  carvedilol (COREG) 3.125 MG tablet Take 1 tablet (3.125 mg total) by mouth 2 (two) times daily with a meal. 12/25/22   Maisie Fus, MD  clopidogrel (PLAVIX) 75 MG tablet Take 1 tablet (75 mg total) by mouth daily. 06/21/22   Marjie Skiff E, PA-C  EUTHYROX 50 MCG tablet Take 50 mcg by mouth daily. 02/19/22   [provider]  ezetimibe (ZETIA) 10 MG tablet Take 1 tablet (10 mg total) by mouth daily. 11/07/22   Corrin Parker, PA-C  LORazepam (ATIVAN) 0.5 MG tablet Take by mouth. 03/27/22   [provider]  mirtazapine (REMERON) 7.5 MG tablet Take 1 tablet (7.5 mg total) by mouth at bedtime.  03/15/22   Love, Evlyn Kanner, PA-C  potassium chloride (KLOR-CON) 10 MEQ tablet Take 1 tablet by mouth daily. 04/13/16   [provider]  sacubitril-valsartan (ENTRESTO) 24-26 MG Take 1 tablet by mouth 2 (two) times daily. 06/21/22   Marjie Skiff E, PA-C  sertraline (ZOLOFT) 25 MG tablet Take 1 tablet (25 mg total) by mouth daily. 03/15/22   Love, Evlyn Kanner, PA-C  torsemide (DEMADEX) 20 MG tablet Take 2 tablets (40 mg total) by mouth daily. 06/21/22   Marjie Skiff E, PA-C      Allergies    Diphenhydramine hcl, Moxifloxacin hcl in nacl, Penicillin g, Sulfa antibiotics, Colistin, Diclofenac sodium, Esomeprazole, Estradiol, Medroxyprogesterone acetate, Neomycin-polymyxin-hc, Nitrofurantoin, Nsaids, Prednisone, Proparacaine, Rabeprazole, Amitriptyline, Aspirin, Azithromycin, Carbocaine [mepivacaine], Cimetidine, Ciprofloxacin, Cisapride, Clindamycin/lincomycin, Cortisporin-tc [neomycin-colist-hc-thonzonium], Escitalopram, Hydrochlorothiazide, Hyoscyamine, Ibuprofen, Iodine, Medroxyprogesterone, Meloxicam, Minocycline, Moxifloxacin, Nitrofuran derivatives, Ofloxacin, Pantoprazole, Penicillins, Pneumococcal vaccines, Prednisolone, Prevacid [lansoprazole], Propoxyphene, Soap, Spironolactone, Tobradex [tobramycin-dexamethasone], Trolamine salicylate, Verapamil, Voltaren [diclofenac], Diphenhydramine, Hydrocodone, and Tramadol    Review of Systems   Review of Systems  All other systems reviewed and are negative.   Physical Exam Updated Vital Signs BP (!) 187/64   Pulse 70   Temp 97.7 F (36.5 C) (Oral)   Resp 15   SpO2 100%  Physical Exam Vitals and nursing note reviewed.  Constitutional:      General: She is not in acute distress.    Appearance: She is well-developed.  Comments: GCS 14, ABC intact  HENT:     Head: Normocephalic and atraumatic.  Eyes:     Extraocular Movements: Extraocular movements intact.     Conjunctiva/sclera: Conjunctivae normal.     Pupils: Pupils are equal,  round, and reactive to light.  Neck:     Comments: No midline tenderness to palpation of the cervical spine.  Range of motion intact Cardiovascular:     Rate and Rhythm: Normal rate and regular rhythm.  Pulmonary:     Effort: Pulmonary effort is normal. No respiratory distress.     Breath sounds: Normal breath sounds.  Chest:     Comments: Clavicles stable nontender to AP compression.  Chest wall stable and nontender to AP and lateral compression. Abdominal:     Palpations: Abdomen is soft.     Tenderness: There is abdominal tenderness.     Comments: Pelvis stable to lateral compression, generalized abdominal tenderness noted  Musculoskeletal:     Cervical back: Neck supple.     Comments: No midline tenderness to palpation of the thoracic or lumbar spine.  Right proximal femur with TTP, obvious deformity and swelling, RLE shortened and externally rotated., distal pulses 2+  Skin:    General: Skin is warm and dry.  Neurological:     Mental Status: She is alert.     Comments: Cranial nerves II through XII grossly intact.  Moving all 4 extremities spontaneously.  Sensation grossly intact all 4 extremities     ED Results / Procedures / Treatments   Labs (all labs ordered are listed, but only abnormal results are displayed) Labs Reviewed  COMPREHENSIVE METABOLIC PANEL - Abnormal; Notable for the following components:      Result Value   Chloride 97 (*)    Glucose, Bld 137 (*)    BUN 30 (*)    Creatinine, Ser 1.43 (*)    GFR, Estimated 36 (*)    Anion gap 18 (*)    All other components within normal limits  CBC - Abnormal; Notable for the following components:   RBC 3.50 (*)    Hemoglobin 10.8 (*)    HCT 31.3 (*)    All other components within normal limits  URINALYSIS, ROUTINE W REFLEX MICROSCOPIC - Abnormal; Notable for the following components:   APPearance CLOUDY (*)    Hgb urine dipstick SMALL (*)    Leukocytes,Ua TRACE (*)    Bacteria, UA RARE (*)    All other  components within normal limits  LACTIC ACID, PLASMA - Abnormal; Notable for the following components:   Lactic Acid, Venous 2.4 (*)    All other components within normal limits  I-STAT CHEM 8, ED - Abnormal; Notable for the following components:   Potassium 3.4 (*)    BUN 31 (*)    Creatinine, Ser 1.30 (*)    Glucose, Bld 126 (*)    Calcium, Ion 1.10 (*)    Hemoglobin 11.6 (*)    HCT 34.0 (*)    All other components within normal limits  ETHANOL  PROTIME-INR  SAMPLE TO BLOOD BANK    EKG None  Radiology CT CHEST ABDOMEN PELVIS W CONTRAST  Result Date: 01/13/2023 CLINICAL DATA:  Polytrauma, blunt EXAM: CT CHEST, ABDOMEN, AND PELVIS WITH CONTRAST TECHNIQUE: Multidetector CT imaging of the chest, abdomen and pelvis was performed following the standard protocol during bolus administration of intravenous contrast. RADIATION DOSE REDUCTION: This exam was performed according to the departmental dose-optimization program which includes automated exposure control, adjustment of  the mA and/or kV according to patient size and/or use of iterative reconstruction technique. CONTRAST:  75mL OMNIPAQUE IOHEXOL 350 MG/ML SOLN COMPARISON:  Same day radiographs FINDINGS: CT CHEST FINDINGS Cardiovascular: No significant vascular findings. Thoracic aorta is nonaneurysmal. Atherosclerotic calcifications of the aorta and coronary arteries. Calcified aortic valve. Central pulmonary vasculature is nondilated. Normal heart size. No pericardial effusion. Mediastinum/Nodes: No enlarged mediastinal, hilar, or axillary lymph nodes. Thyroid gland, trachea, and esophagus demonstrate no significant findings. Lungs/Pleura: Bandlike scarring or atelectasis within the right middle lobe and to a lesser degree within the lingula. No focal airspace consolidation. No pleural effusion or pneumothorax. Musculoskeletal: Chronic-appearing severe wedge compression fracture of the T12 vertebral body. Remaining thoracic vertebral body  heights are maintained. No chest wall hematoma. CT ABDOMEN PELVIS FINDINGS Hepatobiliary: No focal liver abnormality is seen. No gallstones, gallbladder wall thickening, or biliary dilatation. Pancreas: Unremarkable. No pancreatic ductal dilatation or surrounding inflammatory changes. Spleen: Normal in size without focal abnormality. Adrenals/Urinary Tract: Unremarkable adrenal glands. Punctate 2 mm stone within the upper pole of the left kidney. No right-sided renal calculi. No solid renal lesion. Mild-to-moderate bilateral hydronephrosis. Moderately distended urinary bladder. 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 Remainder of the stomach appears within normal limits. No dilated loops of bowel. No focal bowel wall thickening or inflammatory changes. Vascular/Lymphatic: Aortic atherosclerosis. No enlarged abdominal or pelvic lymph nodes. Reproductive: Status post hysterectomy. No adnexal masses. Other: No free fluid. No abdominopelvic fluid collection. No pneumoperitoneum. No abdominal wall hernia. Musculoskeletal: Acute comminuted intertrochanteric fracture of the right femur with varus angulation. Soft tissue fullness at the fracture site. No well-defined hematoma. Lumbar vertebral body heights are maintained. Pelvic bony ring intact without fracture or diastasis. IMPRESSION: 1. Acute comminuted intertrochanteric fracture of the right femur with varus angulation. 2. Chronic-appearing severe wedge compression fracture of the T12 vertebral body. An acute on chronic component is not excluded. Correlate with point tenderness. 3. Mild-to-moderate bilateral hydronephrosis with moderate distention of the urinary bladder. Correlate for urinary retention or outlet obstruction. 4. Punctate left nephrolithiasis. 5. 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 6. Aortic atherosclerosis (ICD10-I70.0).  Electronically Signed   By: Duanne Guess D.O.   On: 01/13/2023 13:46   CT HEAD WO CONTRAST  Result Date: 01/13/2023 CLINICAL DATA:  Head trauma, moderate-severe; Polytrauma, blunt EXAM: CT HEAD WITHOUT CONTRAST CT CERVICAL SPINE WITHOUT CONTRAST TECHNIQUE: Multidetector CT imaging of the head and cervical spine was performed following the standard protocol without intravenous contrast. Multiplanar CT image reconstructions of the cervical spine were also generated. RADIATION DOSE REDUCTION: This exam was performed according to the departmental dose-optimization program which includes automated exposure control, adjustment of the mA and/or kV according to patient size and/or use of iterative reconstruction technique. COMPARISON:  CT head March 02, 2022. 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 No evidence of acute large vascular territory infarct, acute hemorrhage, mass lesion, or midline shift. Vascular: Calcific atherosclerosis. Skull: No acute fracture. Sinuses/Orbits: Clear sinuses.  No acute orbital findings. Other: No mastoid effusions. CT CERVICAL SPINE FINDINGS Alignment: Mild anterolisthesis of C5 on C6, likely degenerative given facet arthropathy at this level. Otherwise, no substantial sagittal subluxation. Skull base and vertebrae: No evidence of acute fracture. Vertebral body heights are maintained. Osteopenia. Soft tissues and spinal canal: No prevertebral fluid or swelling. No visible canal hematoma. Disc levels: Multilevel facet and  uncovertebral hypertrophy with varying degrees of neural foraminal stenosis. 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 Osteopenia. Upper chest: Biapical pleuroparenchymal scarring.  No consolidation. IMPRESSION: No evidence of acute abnormality intracranially or in the cervical spine. Electronically Signed   By: Feliberto Harts M.D.   On: 01/13/2023 13:35   CT CERVICAL SPINE WO CONTRAST  Result Date:  01/13/2023 CLINICAL DATA:  Head trauma, moderate-severe; Polytrauma, blunt EXAM: CT HEAD WITHOUT CONTRAST CT CERVICAL SPINE WITHOUT CONTRAST TECHNIQUE: Multidetector CT imaging of the head and cervical spine was performed following the standard protocol without intravenous contrast. Multiplanar CT image reconstructions of the cervical spine were also generated. RADIATION DOSE REDUCTION: This exam was performed according to the departmental dose-optimization program which includes automated exposure control, adjustment of the mA and/or kV according to patient size and/or use of iterative reconstruction technique. COMPARISON:  CT head March 02, 2022. 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 No evidence of acute large vascular territory infarct, acute hemorrhage, mass lesion, or midline shift. Vascular: Calcific atherosclerosis. Skull: No acute fracture. Sinuses/Orbits: Clear sinuses.  No acute orbital findings. Other: No mastoid effusions. CT CERVICAL SPINE FINDINGS Alignment: Mild anterolisthesis of C5 on C6, likely degenerative given facet arthropathy at this level. Otherwise, no substantial sagittal subluxation. Skull base and vertebrae: No evidence of acute fracture. Vertebral body heights are maintained. Osteopenia. Soft tissues and spinal canal: No prevertebral fluid or swelling. No visible canal hematoma. Disc levels: Multilevel facet and uncovertebral hypertrophy with varying degrees of neural foraminal stenosis. 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 Osteopenia. Upper chest: Biapical pleuroparenchymal scarring.  No consolidation. IMPRESSION: No evidence of acute abnormality intracranially or in the cervical spine. Electronically Signed   By: Feliberto Harts M.D.   On: 01/13/2023 13:35   DG Pelvis Portable  Result Date: 01/13/2023 CLINICAL DATA:  Right leg pain after fall. EXAM: PORTABLE PELVIS 1-2 VIEWS; RIGHT FEMUR 2 VIEWS COMPARISON:  None Available.  FINDINGS: Acute comminuted right intertrochanteric femur fracture with varus angulation. No additional fracture. No dislocation. Mild degenerative changes of both hip joints. Moderate right knee medial compartment osteoarthritis. Diffuse osteopenia. Soft tissues are unremarkable. IMPRESSION: 1. Acute comminuted right intertrochanteric femur fracture. Electronically Signed   By: Obie Dredge M.D.   On: 01/13/2023 12:54   DG FEMUR, MIN 2 VIEWS RIGHT  Result Date: 01/13/2023 CLINICAL DATA:  Right leg pain after fall. EXAM: PORTABLE PELVIS 1-2 VIEWS; RIGHT FEMUR 2 VIEWS COMPARISON:  None Available. FINDINGS: Acute comminuted right intertrochanteric femur fracture with varus angulation. No additional fracture. No dislocation. Mild degenerative changes of both hip joints. Moderate right knee medial compartment osteoarthritis. Diffuse osteopenia. Soft tissues are unremarkable. IMPRESSION: 1. Acute comminuted right intertrochanteric femur fracture. Electronically Signed   By: Obie Dredge M.D.   On: 01/13/2023 12:54   DG Chest Port 1 View  Result Date: 01/13/2023 CLINICAL DATA:  Trauma EXAM: PORTABLE CHEST 1 VIEW COMPARISON:  None Available. FINDINGS: The heart size and mediastinal contours are within normal limits. Aortic atherosclerosis. Both lungs are clear. The visualized skeletal structures are unremarkable. IMPRESSION: No active disease. Electronically Signed   By: Duanne Guess D.O.   On: 01/13/2023 12:53    Procedures Procedures    Medications Ordered in ED Medications  carvedilol (COREG) tablet 3.125 mg (has no administration in time range)  atorvastatin (LIPITOR) tablet 80 mg (has no administration in time range)  ezetimibe (ZETIA) tablet 10 mg (has no administration in time range)  torsemide (DEMADEX) tablet  40 mg (has no administration in time range)  sacubitril-valsartan (ENTRESTO) 24-26 mg per tablet (has no administration in time range)  levothyroxine (SYNTHROID) tablet 50 mcg  (has no administration in time range)  mirtazapine (REMERON) tablet 7.5 mg (has no administration in time range)  sertraline (ZOLOFT) tablet 25 mg (has no administration in time range)  LORazepam (ATIVAN) tablet 0.5 mg (has no administration in time range)  fentaNYL (SUBLIMAZE) injection 25 mcg (has no administration in time range)  polyethylene glycol (MIRALAX / GLYCOLAX) packet 17 g (has no administration in time range)  ondansetron (ZOFRAN) injection 4 mg (has no administration in time range)  fentaNYL (SUBLIMAZE) injection 25 mcg (25 mcg Intravenous Given 01/13/23 1257)  ondansetron (ZOFRAN) injection 4 mg (4 mg Intravenous Given 01/13/23 1230)  iohexol (OMNIPAQUE) 350 MG/ML injection 75 mL (75 mLs Intravenous Contrast Given 01/13/23 1326)  ondansetron (ZOFRAN) injection 4 mg (4 mg Intravenous Given 01/13/23 1614)  fentaNYL (SUBLIMAZE) injection 50 mcg (50 mcg Intravenous Given 01/13/23 1614)    ED Course/ Medical Decision Making/ A&P                             Medical Decision Making Amount and/or Complexity of Data Reviewed Labs: ordered. Radiology: ordered.  Risk Prescription drug management. Decision regarding hospitalization.    82 year old female with medical history significant for HTN, HLD, CHF, CVA, CKD, hypothyroidism, reported history of dementia per EMS who presents to the emergency department after a fall from standing  Presented as a level 2 trauma after a fall due to the patient being on Plavix.  She was found to have a deformity of the proximal right femur with shortening and external rotation, was neurovascularly intact, no other signs of trauma.  The patient denied any head trauma or loss of consciousness.  She arrives to the emergency department GCS 14, ABC intact, obvious deformity to the right lower extremity present, pulses intact 2+ in the right DP pulse.  On arrival, the patient was afebrile, bradycardic heart rate 55, hemodynamically stable, initial blood  pressure 160/88, saturating 98% on room air.  Physical exam concerning for a likely proximal femur fracture with a right lower extremity that was shortened, externally rotated, swelling and tenderness noted to the proximal femur, distally neurovascularly intact.  X-ray imaging confirmed a likely intertrochanteric fracture.  The patient was provided with fentanyl for pain control and Zofran for nausea.  Orthopedics was consulted, Dr. Yevette Edwards recommended n.p.o. for likely surgical management in the a.m., admission to medicine.  CT head and Cervical Spine: IMPRESSION:  No evidence of acute abnormality intracranially or in the cervical  spine.   CT C/A/P: IMPRESSION:  1. Acute comminuted intertrochanteric fracture of the right femur  with varus angulation.  2. Chronic-appearing severe wedge compression fracture of the T12  vertebral body. An acute on chronic component is not excluded.  Correlate with point tenderness.  3. Mild-to-moderate bilateral hydronephrosis with moderate  distention of the urinary bladder. Correlate for urinary retention  or outlet obstruction.  4. Punctate left nephrolithiasis.  5. 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  6. Aortic atherosclerosis (ICD10-I70.0).    1430 Spoke with on-call neurosurgery, who felt that it was likely chronic, did not recommend bracing. If patient develops worsening back pain, and neuro deficits, can re-engage.  Discussed with trauma surgery, Dr. Cliffton Asters, given chronicity of findings in the patient's spine, patient can be admitted to  medicine for medical clearance prior to surgery with orthopedics. Dr. Yetta Barre of on-call neurosurgery agreed.  Medicine consulted for admission, Dr. Alinda Money accepting.   Final Clinical Impression(s) / ED Diagnoses Final diagnoses:  Fall, initial encounter  Closed displaced intertrochanteric fracture of right femur, initial encounter    Rx / DC Orders ED  Discharge Orders     None         Ernie Avena, MD 01/13/23 1655

## 2023-01-13 NOTE — H&P (Addendum)
History and Physical   Tamara Bullock:[id number] DOB: 11-Feb-1941 DOA: 01/13/2023  PCP: Vivien Presto, MD   Patient coming from: Home  Chief Complaint: Fall  HPI: Tamara Bullock is a 82 y.o. female with medical history significant of CKD 3A, OSA, hypertension, hyperlipidemia, hypothyroidism, depression, stroke, CHF presenting after fall at home.  Patient reportedly had a fall as she was scooting off her recliner and then slipped off the edge and fell on her bottom..  After the fall she noted to have a deformity of her right proximal femur and EMS was called.  Patient is on Plavix.  Denies loss of consciousness or hitting head.  She denies fevers, chills, chest pain, shortness of breath, abdominal pain, constipation, diarrhea, nausea, vomiting.  ED Course: Signs in the ED notable for blood pressure in the 160s to 180s systolic.  Lab workup included CMP with chloride 97, BUN 30, creatinine stable at 1.43, gap elevated to 18, glucose 137.  CBC with hemoglobin near baseline at 10.8.  PT and INR pending.  Lactic acid borderline elevated at 2.4, repeat pending.  Ethanol level negative.  Urinalysis with hemoglobin, leukocytes, rare bacteria.  Chest x-ray showed no acute normality.  Pelvis x-ray showed right intratrochanteric femur fracture which was also demonstrated on right femur x-ray.  CT head and CT C-spine showed no acute abnormality.  CT of the chest abdomen pelvis showed right femur fracture as above as well as chronic severe T12 wedge fracture, mild to moderate hydronephrosis, renal stone, gastric diverticula.  Patient received fentanyl and Zofran in the ED.  Neurosurgery, trauma surgery, orthopedic surgery consulted in the ED.  Due to patient's chronic compression fracture patient deemed appropriate for medical admission.  Orthopedic surgery plans to operate tomorrow, recommended n.p.o. at midnight and holding Plavix.  Review of Systems: As per HPI otherwise all other systems reviewed  and are negative.  Past Medical History:  Diagnosis Date   Chronic combined systolic and diastolic CHF (congestive heart failure)    Chronic kidney disease (CKD), stage III (moderate)    Essential hypertension    Hyperlipidemia    Hypothyroidism    Stroke     Past Surgical History:  Procedure Laterality Date   ABDOMINAL HYSTERECTOMY     BACK SURGERY      Social History  reports that she has never smoked. She has never been exposed to tobacco smoke. She has never used smokeless tobacco. She reports that she does not currently use alcohol. She reports that she does not use drugs.  Allergies  Allergen Reactions   Diphenhydramine Hcl Shortness Of Breath    Dermatologicals-iv administration was painful until diluted; can take liquid in small doses by mouth.   Moxifloxacin Hcl In Nacl     Other reaction(s): Other (see comments) Fluoroquinolones-short term stomach and chest pain   Penicillin G Hives   Sulfa Antibiotics Hives   Colistin     Other reaction(s): Other (see comments)   Diclofenac Sodium     Other reaction(s): Other Analgesics-Anti-inflammatory   Esomeprazole     Other reaction(s): Other (see comments)   Estradiol     Other reaction(s): Other (see comments)   Medroxyprogesterone Acetate     Other reaction(s): Other (see comments) Contraceptives   Neomycin-Polymyxin-Hc     Other reaction(s): Other Eye drops   Nitrofurantoin     Other reaction(s): Other Anti-infectives   Nsaids     Other reaction(s): Other (see comments) Can't tolerate OTC anit-inflammatories-can tolerate advil for a  short periods   Prednisone     Other reaction(s): Other (see comments) Eye drop   Proparacaine     Other reaction(s): Other (see comments) Used at eye doctor   Rabeprazole     Other reaction(s): Other (see comments) Ulcer drugs-stomach/chest pain, pasty yellow stools   Amitriptyline     Antidepressants   Aspirin    Azithromycin    Carbocaine [Mepivacaine]    Cimetidine     Ciprofloxacin    Cisapride    Clindamycin/Lincomycin    Cortisporin-Tc [Neomycin-Colist-Hc-Thonzonium]    Escitalopram    Hydrochlorothiazide    Hyoscyamine     Ulcer drugs   Ibuprofen    Iodine    Medroxyprogesterone    Meloxicam    Minocycline     Tetracyclines   Moxifloxacin     Chest pain    Nitrofuran Derivatives    Ofloxacin    Pantoprazole    Penicillins    Pneumococcal Vaccines    Prednisolone    Prevacid [Lansoprazole]    Propoxyphene     -N 100 -Analgesics-opiod   Soap    Spironolactone    Tobradex [Tobramycin-Dexamethasone]    Trolamine Salicylate    Verapamil    Voltaren [Diclofenac]    Diphenhydramine     Other reaction(s): Other (see comments)   Hydrocodone     Other reaction(s): Other (see comments)   Tramadol     Other reaction(s): Other (see comments)    Family History  Problem Relation Age of Onset   Stroke Mother    Heart attack Father   Reviewed on admission  Prior to Admission medications   Medication Sig Start Date End Date Taking? Authorizing Provider  acetaminophen (TYLENOL) 325 MG tablet Take 1-2 tablets (325-650 mg total) by mouth every 4 (four) hours as needed for mild pain. 03/12/22   Love, Evlyn Kanner, PA-C  atorvastatin (LIPITOR) 80 MG tablet Take 1 tablet (80 mg total) by mouth daily. 03/15/22   Love, Evlyn Kanner, PA-C  carvedilol (COREG) 3.125 MG tablet Take 1 tablet (3.125 mg total) by mouth 2 (two) times daily with a meal. 12/25/22   Maisie Fus, MD  clopidogrel (PLAVIX) 75 MG tablet Take 1 tablet (75 mg total) by mouth daily. 06/21/22   Marjie Skiff E, PA-C  EUTHYROX 50 MCG tablet Take 50 mcg by mouth daily. 02/19/22   [provider]  ezetimibe (ZETIA) 10 MG tablet Take 1 tablet (10 mg total) by mouth daily. 11/07/22   Corrin Parker, PA-C  LORazepam (ATIVAN) 0.5 MG tablet Take by mouth. 03/27/22   [provider]  mirtazapine (REMERON) 7.5 MG tablet Take 1 tablet (7.5 mg total) by mouth at bedtime. 03/15/22    Love, Evlyn Kanner, PA-C  potassium chloride (KLOR-CON) 10 MEQ tablet Take 1 tablet by mouth daily. 04/13/16   [provider]  sacubitril-valsartan (ENTRESTO) 24-26 MG Take 1 tablet by mouth 2 (two) times daily. 06/21/22   Marjie Skiff E, PA-C  sertraline (ZOLOFT) 25 MG tablet Take 1 tablet (25 mg total) by mouth daily. 03/15/22   Love, Evlyn Kanner, PA-C  torsemide (DEMADEX) 20 MG tablet Take 2 tablets (40 mg total) by mouth daily. 06/21/22   Corrin Parker, PA-C    Physical Exam: Vitals:   01/13/23 1530 01/13/23 1545 01/13/23 1600 01/13/23 1615  BP:    (!) 187/64  Pulse: 63 64 73 70  Resp: (!) Temp:      TempSrc:  SpO2: 100% 98% 100% 100%    Physical Exam Constitutional:      General: She is in acute distress.     Appearance: Normal appearance.  HENT:     Head: Normocephalic and atraumatic.     Mouth/Throat:     Mouth: Mucous membranes are moist.     Pharynx: Oropharynx is clear.  Eyes:     Extraocular Movements: Extraocular movements intact.     Pupils: Pupils are equal, round, and reactive to light.  Cardiovascular:     Rate and Rhythm: Normal rate and regular rhythm.     Pulses: Normal pulses.     Heart sounds: Normal heart sounds.  Pulmonary:     Effort: Pulmonary effort is normal. No respiratory distress.     Breath sounds: Normal breath sounds.  Abdominal:     General: Bowel sounds are normal. There is no distension.     Palpations: Abdomen is soft.     Tenderness: There is no abdominal tenderness.  Musculoskeletal:        General: No swelling or deformity.     Comments: Bilateral lower extremities neurovascularly intact.  Right lower extremity is externally rotated and foreshortened.  Skin:    General: Skin is warm and dry.  Neurological:     General: No focal deficit present.     Mental Status: Mental status is at baseline.    Labs on Admission: I have personally reviewed following labs and imaging studies  CBC: Recent Labs  Lab  01/13/23 1340 01/13/23 1435  WBC 10.1  --   HGB 10.8* 11.6*  HCT 31.3* 34.0*  MCV 89.4  --   PLT 247  --     Basic Metabolic Panel: Recent Labs  Lab 01/13/23 1340 01/13/23 1435  NA 139 138  K 3.6 3.4*  CL 97* 103  CO2 24  --   GLUCOSE 137* 126*  BUN 30* 31*  CREATININE 1.43* 1.30*  CALCIUM 9.2  --     GFR: CrCl cannot be calculated (Unknown ideal weight.).  Liver Function Tests: Recent Labs  Lab 01/13/23 1340  AST 33  ALT 22  ALKPHOS 63  BILITOT 1.0  PROT 6.8  ALBUMIN 4.1    Urine analysis:    Component Value Date/Time   COLORURINE YELLOW 01/13/2023 1440   APPEARANCEUR CLOUDY (A) 01/13/2023 1440   LABSPEC 1.011 01/13/2023 1440   PHURINE 7.0 01/13/2023 1440   GLUCOSEU NEGATIVE 01/13/2023 1440   HGBUR SMALL (A) 01/13/2023 1440   BILIRUBINUR NEGATIVE 01/13/2023 1440   KETONESUR NEGATIVE 01/13/2023 1440   PROTEINUR NEGATIVE 01/13/2023 1440   NITRITE NEGATIVE 01/13/2023 1440   LEUKOCYTESUR TRACE (A) 01/13/2023 1440    Radiological Exams on Admission: CT CHEST ABDOMEN PELVIS W CONTRAST  Result Date: 01/13/2023 CLINICAL DATA:  Polytrauma, blunt EXAM: CT CHEST, ABDOMEN, AND PELVIS WITH CONTRAST TECHNIQUE: Multidetector CT imaging of the chest, abdomen and pelvis was performed following the standard protocol during bolus administration of intravenous contrast. RADIATION DOSE REDUCTION: This exam was performed according to the departmental dose-optimization program which includes automated exposure control, adjustment of the mA and/or kV according to patient size and/or use of iterative reconstruction technique. CONTRAST:  75mL OMNIPAQUE IOHEXOL 350 MG/ML SOLN COMPARISON:  Same day radiographs FINDINGS: CT CHEST FINDINGS Cardiovascular: No significant vascular findings. Thoracic aorta is nonaneurysmal. Atherosclerotic calcifications of the aorta and coronary arteries. Calcified aortic valve. Central pulmonary vasculature is nondilated. Normal heart size. No  pericardial effusion. Mediastinum/Nodes: No enlarged mediastinal, hilar, or axillary  lymph nodes. Thyroid gland, trachea, and esophagus demonstrate no significant findings. Lungs/Pleura: Bandlike scarring or atelectasis within the right middle lobe and to a lesser degree within the lingula. No focal airspace consolidation. No pleural effusion or pneumothorax. Musculoskeletal: Chronic-appearing severe wedge compression fracture of the T12 vertebral body. Remaining thoracic vertebral body heights are maintained. No chest wall hematoma. CT ABDOMEN PELVIS FINDINGS Hepatobiliary: No focal liver abnormality is seen. No gallstones, gallbladder wall thickening, or biliary dilatation. Pancreas: Unremarkable. No pancreatic ductal dilatation or surrounding inflammatory changes. Spleen: Normal in size without focal abnormality. Adrenals/Urinary Tract: Unremarkable adrenal glands. Punctate 2 mm stone within the upper pole of the left kidney. No right-sided renal calculi. No solid renal lesion. Mild-to-moderate bilateral hydronephrosis. Moderately distended urinary bladder. 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 Remainder of the stomach appears within normal limits. No dilated loops of bowel. No focal bowel wall thickening or inflammatory changes. Vascular/Lymphatic: Aortic atherosclerosis. No enlarged abdominal or pelvic lymph nodes. Reproductive: Status post hysterectomy. No adnexal masses. Other: No free fluid. No abdominopelvic fluid collection. No pneumoperitoneum. No abdominal wall hernia. Musculoskeletal: Acute comminuted intertrochanteric fracture of the right femur with varus angulation. Soft tissue fullness at the fracture site. No well-defined hematoma. Lumbar vertebral body heights are maintained. Pelvic bony ring intact without fracture or diastasis. IMPRESSION: 1. Acute comminuted intertrochanteric fracture of the right femur with  varus angulation. 2. Chronic-appearing severe wedge compression fracture of the T12 vertebral body. An acute on chronic component is not excluded. Correlate with point tenderness. 3. Mild-to-moderate bilateral hydronephrosis with moderate distention of the urinary bladder. Correlate for urinary retention or outlet obstruction. 4. Punctate left nephrolithiasis. 5. 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 6. Aortic atherosclerosis (ICD10-I70.0). Electronically Signed   By: Duanne Guess D.O.   On: 01/13/2023 13:46   CT HEAD WO CONTRAST  Result Date: 01/13/2023 CLINICAL DATA:  Head trauma, moderate-severe; Polytrauma, blunt EXAM: CT HEAD WITHOUT CONTRAST CT CERVICAL SPINE WITHOUT CONTRAST TECHNIQUE: Multidetector CT imaging of the head and cervical spine was performed following the standard protocol without intravenous contrast. Multiplanar CT image reconstructions of the cervical spine were also generated. RADIATION DOSE REDUCTION: This exam was performed according to the departmental dose-optimization program which includes automated exposure control, adjustment of the mA and/or kV according to patient size and/or use of iterative reconstruction technique. COMPARISON:  CT head March 02, 2022. 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 No evidence of acute large vascular territory infarct, acute hemorrhage, mass lesion, or midline shift. Vascular: Calcific atherosclerosis. Skull: No acute fracture. Sinuses/Orbits: Clear sinuses.  No acute orbital findings. Other: No mastoid effusions. CT CERVICAL SPINE FINDINGS Alignment: Mild anterolisthesis of C5 on C6, likely degenerative given facet arthropathy at this level. Otherwise, no substantial sagittal subluxation. Skull base and vertebrae: No evidence of acute fracture. Vertebral body heights are maintained. Osteopenia. Soft tissues and spinal canal: No prevertebral fluid or  swelling. No visible canal hematoma. Disc levels: Multilevel facet and uncovertebral hypertrophy with varying degrees of neural foraminal stenosis. 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 Osteopenia. Upper chest: Biapical pleuroparenchymal scarring.  No consolidation. IMPRESSION: No evidence of acute abnormality intracranially or in the cervical spine. Electronically Signed   By: Feliberto Harts M.D.   On: 01/13/2023 13:35   CT CERVICAL SPINE WO CONTRAST  Result Date: 01/13/2023 CLINICAL DATA:  Head trauma, moderate-severe; Polytrauma, blunt  EXAM: CT HEAD WITHOUT CONTRAST CT CERVICAL SPINE WITHOUT CONTRAST TECHNIQUE: Multidetector CT imaging of the head and cervical spine was performed following the standard protocol without intravenous contrast. Multiplanar CT image reconstructions of the cervical spine were also generated. RADIATION DOSE REDUCTION: This exam was performed according to the departmental dose-optimization program which includes automated exposure control, adjustment of the mA and/or kV according to patient size and/or use of iterative reconstruction technique. COMPARISON:  CT head March 02, 2022. 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 No evidence of acute large vascular territory infarct, acute hemorrhage, mass lesion, or midline shift. Vascular: Calcific atherosclerosis. Skull: No acute fracture. Sinuses/Orbits: Clear sinuses.  No acute orbital findings. Other: No mastoid effusions. CT CERVICAL SPINE FINDINGS Alignment: Mild anterolisthesis of C5 on C6, likely degenerative given facet arthropathy at this level. Otherwise, no substantial sagittal subluxation. Skull base and vertebrae: No evidence of acute fracture. Vertebral body heights are maintained. Osteopenia. Soft tissues and spinal canal: No prevertebral fluid or swelling. No visible canal hematoma. Disc levels: Multilevel facet and uncovertebral hypertrophy with varying degrees of  neural foraminal stenosis. 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 Osteopenia. Upper chest: Biapical pleuroparenchymal scarring.  No consolidation. IMPRESSION: No evidence of acute abnormality intracranially or in the cervical spine. Electronically Signed   By: Feliberto Harts M.D.   On: 01/13/2023 13:35   DG Pelvis Portable  Result Date: 01/13/2023 CLINICAL DATA:  Right leg pain after fall. EXAM: PORTABLE PELVIS 1-2 VIEWS; RIGHT FEMUR 2 VIEWS COMPARISON:  None Available. FINDINGS: Acute comminuted right intertrochanteric femur fracture with varus angulation. No additional fracture. No dislocation. Mild degenerative changes of both hip joints. Moderate right knee medial compartment osteoarthritis. Diffuse osteopenia. Soft tissues are unremarkable. IMPRESSION: 1. Acute comminuted right intertrochanteric femur fracture. Electronically Signed   By: Obie Dredge M.D.   On: 01/13/2023 12:54   DG FEMUR, MIN 2 VIEWS RIGHT  Result Date: 01/13/2023 CLINICAL DATA:  Right leg pain after fall. EXAM: PORTABLE PELVIS 1-2 VIEWS; RIGHT FEMUR 2 VIEWS COMPARISON:  None Available. FINDINGS: Acute comminuted right intertrochanteric femur fracture with varus angulation. No additional fracture. No dislocation. Mild degenerative changes of both hip joints. Moderate right knee medial compartment osteoarthritis. Diffuse osteopenia. Soft tissues are unremarkable. IMPRESSION: 1. Acute comminuted right intertrochanteric femur fracture. Electronically Signed   By: Obie Dredge M.D.   On: 01/13/2023 12:54   DG Chest Port 1 View  Result Date: 01/13/2023 CLINICAL DATA:  Trauma EXAM: PORTABLE CHEST 1 VIEW COMPARISON:  None Available. FINDINGS: The heart size and mediastinal contours are within normal limits. Aortic atherosclerosis. Both lungs are clear. The visualized skeletal structures are unremarkable. IMPRESSION: No active disease. Electronically Signed   By: Duanne Guess D.O.   On: 01/13/2023 12:53    EKG:  Performed in the emergency department but not released.  Assessment/Plan Principal Problem:   Closed comminuted intertrochanteric fracture of right femur Active Problems:   Essential hypertension   Stage 3a chronic kidney disease (CKD)   Depression   Hypothyroidism   Chronic combined systolic and diastolic CHF (congestive heart failure)   History of stroke   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 (CVA)   Hyperlipidemia   OSA (obstructive sleep apnea)   Closed comminuted intertrochanteric fracture of the right femur. > Patient presenting after ground-level fall at home.  Larey Seat off the edge of recliner and landed on her bottom. > Imaging workup in ED showing only acute right femur fracture. >  Also noted was chronic severe T12 wedge fracture which was reviewed by trauma surgery and neurosurgery on-call who agreed that with its chronic nature patient is appropriate for medical admission. > Received fentanyl and Zofran in the ED. - Monitor on telemetry - Appreciate orthopedic surgery recommendations - N.p.o. at midnight - As needed fentanyl for pain has multiple allergies/adverse reactions to other pain medications; Can dry dilaudid if needed care everywhere search shows hydrocodone reaction was confusion only. - As need IV robaxin - Consult anesthesia for nerve block - Supportive care per femur fracture protocol  CKD 3a Hydronephrosis Elevated anion gap Elevated lactic acid > Creatinine near baseline at 1.43.  Gap 18, lactic acid 2.4 likely from fall today. > As above renal function remained stable but bilateral hydronephrosis and bladder distention was noted on imaging.  Patient complaining about needing to urinate in the ED. - Continue to trend renal function and electrolytes - Trend lactic acid - Bladder scan, and then as needed, in and out cath if needed Addendum - Decided to switch to foley due to patients significant pain especially with movement and urinary  retention, in addition will likely need one perioperative.  OSA - Continue home CPAP  Hypertension - Continue carvedilol, Entresto, torsemide  Hyperlipidemia - Continue home atorvastatin, Zetia  Hypothyroidism - Continue home Synthroid   History of stroke > With reported residual cognitive deficit - Continue home atorvastatin, Zetia - Holding home Plavix as above  Chronic systolic CHF > Last echo in 2023 showed EF 30-35%, indeterminate diastolic torsion, normal RV function. - Continue carvedilol, Entresto, torsemide - Trend renal function and electrolytes  Depression - Continue home sertraline, Remeron, as needed Ativan  DVT prophylaxis: SCDs for now Code Status:   Full Family Communication:  Updated at bedside Disposition Plan:   Patient is from:  Home  Anticipated DC to:  Pending clinical course  Anticipated DC date:  2 to 3 days  Anticipated DC barriers: Pending clinical course, if placement needed  Consults called:  Orthopedic surgery following. (Case reviewed with trauma surgery and neurosurgery in the ED who are not following) Admission status:  Inpatient, telemetry  Severity of Illness: The appropriate patient status for this patient is INPATIENT. Inpatient status is judged to be reasonable and necessary in order to provide the required intensity of service to ensure the patient's safety. The patient's presenting symptoms, physical exam findings, and initial radiographic and laboratory data in the context of their chronic comorbidities is felt to place them at high risk for further clinical deterioration. Furthermore, it is not anticipated that the patient will be medically stable for discharge from the hospital within 2 midnights of admission.   * I certify that at the point of admission it is my clinical judgment that the patient will require inpatient hospital care spanning beyond 2 midnights from the point of admission due to high intensity of service, high risk  for further deterioration and high frequency of surveillance required.Synetta Fail MD [facility]  How to contact the [facility] Attending or Consulting provider 7A - 7P or covering provider during after hours 7P -7A, for this patient?   Check the care team in [facility] and look for a) attending/consulting [facility] provider listed and b) the [facility] team listed Log into [email or web address] and use [facility] universal password to access. If you do not have the password, please contact the hospital operator. Locate the [facility] provider you are looking for under [facility] and page to a  number that you can be directly reached. If you still have difficulty reaching the provider, please page the [facility] (Director on Call) for the Hospitalists listed on amion for assistance.  01/13/2023, 4:48 PM

## 2023-01-13 NOTE — Progress Notes (Signed)
   01/13/23 1206  Spiritual Encounters  Type of Visit Initial  Care provided to: Patient  Conversation partners present during encounter Nurse  Referral source Trauma page  Reason for visit Trauma  OnCall Visit Yes   Chap responded to fall on thinners page.  PT unavailable as medical team provided care. No support person present.

## 2023-01-13 NOTE — ED Triage Notes (Signed)
Pt BIB [facility] due to potential femur fracture. Fell of couch at home 1 hour prior to arrival. No LOC. ABC's intact. BBS . Spine intact. All pules WNLRt proximal leg deformity w/ shortening and outward rotation. MD at bedside. Pt c/o pain 9/10 on verbal pain scale. Pt currently on plavix. Last dose last PM. Orders for zofran andf fentanyl per MD. Meds given.  fentanyl given at 1118 and 1123 per EMS. fentanyl given at 1212 per EMS.  BP 162/88 manual HR 55, RR 16, Sp02 98% RA CBG 139 per EMS

## 2023-01-13 NOTE — Progress Notes (Signed)
Dr. Yetta Barre and Leo Grosser NP previously called about this patient.  I was asked to place a note re: chronic-appearing T12 compression fracture.  In the absence of acute mid-low back pain, further characterization and treatment not required.

## 2023-01-13 NOTE — Progress Notes (Signed)
   01/13/23 1206  Spiritual Encounters  Type of Visit Initial  Care provided to: Patient  Conversation partners present during encounter Nurse  Referral source Trauma page  Reason for visit Trauma  OnCall Visit Yes   Chap responded to fall on thinners page.  PT unavailable as medical team provided care. No support person present. 

## 2023-01-13 NOTE — ED Notes (Signed)
.ED TO INPATIENT HANDOFF REPORT  ED Nurse Name and Phone #: [phone]   S Name/Age/Gender Tamara Bullock 82 y.o. female Room/Bed: 008C/008C  Code Status   Code Status: Full Code  Home/SNF/Other Home Patient oriented to: self, place, time, and situation Is this baseline? Yes   Triage Complete: Triage complete  Chief Complaint Closed comminuted intertrochanteric fracture of right femur [S72.141A]  Triage Note Pt BIB [facility] due to potential femur fracture. Fell of couch at home 1 hour prior to arrival. No LOC. ABC's intact. BBS . Spine intact. All pules WNLRt proximal leg deformity w/ shortening and outward rotation. MD at bedside. Pt c/o pain 9/10 on verbal pain scale. Pt currently on plavix. Last dose last PM. Orders for zofran andf fentanyl per MD. Meds given.  fentanyl given at 1118 and 1123 per EMS. fentanyl given at 1212 per EMS.  BP 162/88 manual HR 55, RR 16, Sp02 98% RA CBG 139 per EMS   Allergies Allergies  Allergen Reactions   Diphenhydramine Hcl Shortness Of Breath    Dermatologicals-iv administration was painful until diluted; can take liquid in small doses by mouth.   Moxifloxacin Hcl In Nacl     Other reaction(s): Other (see comments) Fluoroquinolones-short term stomach and chest pain   Penicillin G Hives   Sulfa Antibiotics Hives   Colistin     Other reaction(s): Other (see comments)   Diclofenac Sodium     Other reaction(s): Other Analgesics-Anti-inflammatory   Esomeprazole     Other reaction(s): Other (see comments)   Estradiol     Other reaction(s): Other (see comments)   Medroxyprogesterone Acetate     Other reaction(s): Other (see comments) Contraceptives   Neomycin-Polymyxin-Hc     Other reaction(s): Other Eye drops   Nitrofurantoin     Other reaction(s): Other Anti-infectives   Nsaids     Other reaction(s): Other (see comments) Can't tolerate OTC anit-inflammatories-can tolerate advil for a short periods   Prednisone      Other reaction(s): Other (see comments) Eye drop   Proparacaine     Other reaction(s): Other (see comments) Used at eye doctor   Rabeprazole     Other reaction(s): Other (see comments) Ulcer drugs-stomach/chest pain, pasty yellow stools   Amitriptyline     Antidepressants   Aspirin    Azithromycin    Carbocaine [Mepivacaine]    Cimetidine    Ciprofloxacin    Cisapride    Clindamycin/Lincomycin    Cortisporin-Tc [Neomycin-Colist-Hc-Thonzonium]    Escitalopram    Hydrochlorothiazide    Hyoscyamine     Ulcer drugs   Ibuprofen    Iodine    Medroxyprogesterone    Meloxicam    Minocycline     Tetracyclines   Moxifloxacin     Chest pain    Nitrofuran Derivatives    Ofloxacin    Pantoprazole    Penicillins    Pneumococcal Vaccines    Prednisolone    Prevacid [Lansoprazole]    Propoxyphene     -N 100 -Analgesics-opiod   Soap    Spironolactone    Tobradex [Tobramycin-Dexamethasone]    Trolamine Salicylate    Verapamil    Voltaren [Diclofenac]    Diphenhydramine     Other reaction(s): Other (see comments)   Hydrocodone     Other reaction(s): Other (see comments)   Tramadol     Other reaction(s): Other (see comments)    Level of Care/Admitting Diagnosis ED Disposition     ED Disposition  Admit  Condition  --   Comment  Hospital Area: [facility] [[id number]]  Level of Care: Telemetry Surgical [105]  May admit patient to [facility] or [facility] if equivalent level of care is available:: No  Covid Evaluation: Asymptomatic - no recent exposure (last 10 days) testing not required  Diagnosis: Closed comminuted intertrochanteric fracture of right femur [[id number]]  Admitting Physician: Synetta Fail [[id number]]  Attending Physician: Synetta Fail [id number]  Certification:: I certify this patient will need inpatient services for at least 2 midnights  Estimated Length of Stay: 2          B Medical/Surgery History Past Medical History:   Diagnosis Date   Chronic combined systolic and diastolic CHF (congestive heart failure)    Chronic kidney disease (CKD), stage III (moderate)    Essential hypertension    Hyperlipidemia    Hypothyroidism    Stroke    Past Surgical History:  Procedure Laterality Date   ABDOMINAL HYSTERECTOMY     BACK SURGERY       A IV Location/Drains/Wounds Patient Lines/Drains/Airways Status     Active Line/Drains/Airways     Name Placement date Placement time Site Days   Peripheral IV Left Antecubital --  --  Antecubital  --            Intake/Output Last 24 hours No intake or output data in the 24 hours ending 01/13/23 1700  Labs/Imaging Results for orders placed or performed during the hospital encounter of 01/13/23 (from the past 48 hour(s))  Comprehensive metabolic panel     Status: Abnormal   Collection Time: 01/13/23  1:40 PM  Result Value Ref Range   Sodium 139 135 - 145 mmol/L   Potassium 3.6 3.5 - 5.1 mmol/L   Chloride 97 (L) 98 - 111 mmol/L   CO2 24 22 - 32 mmol/L   Glucose, Bld 137 (H) 70 - 99 mg/dL    Comment: Glucose reference range applies only to samples taken after fasting for at least 8 hours.   BUN 30 (H) 8 - 23 mg/dL   Creatinine, Ser 9.56 (H) 0.44 - 1.00 mg/dL   Calcium 9.2 8.9 - 21.3 mg/dL   Total Protein 6.8 6.5 - 8.1 g/dL   Albumin 4.1 3.5 - 5.0 g/dL   AST 33 15 - 41 U/L   ALT 22 0 - 44 U/L   Alkaline Phosphatase 63 38 - 126 U/L   Total Bilirubin 1.0 0.3 - 1.2 mg/dL   GFR, Estimated 36 (L) >60 mL/min    Comment: (NOTE) Calculated using the CKD-EPI Creatinine Equation (2021)    Anion gap 18 (H) 5 - 15    Comment: Performed at [facility], [address], [place], [place] [address]  CBC     Status: Abnormal   Collection Time: 01/13/23  1:40 PM  Result Value Ref Range   WBC 10.1 4.0 - 10.5 K/uL   RBC 3.50 (L) 3.87 - 5.11 MIL/uL   Hemoglobin 10.8 (L) 12.0 - 15.0 g/dL   HCT 84.6 (L) 96.2 - 95.2 %   MCV 89.4 80.0 - 100.0 fL   MCH 30.9 26.0 -  34.0 pg   MCHC 34.5 30.0 - 36.0 g/dL   RDW 84.1 32.4 - 40.1 %   Platelets 247 150 - 400 K/uL   nRBC 0.0 0.0 - 0.2 %    Comment: Performed at [facility], [address] [place], [place] [address]  Ethanol  Status: None   Collection Time: 01/13/23  1:40 PM  Result Value Ref Range   Alcohol, Ethyl (B) <10 <10 mg/dL    Comment: (NOTE) Lowest detectable limit for serum alcohol is 10 mg/dL.  For medical purposes only. Performed at [facility], [address], [place], [place] [address]   Lactic acid, plasma     Status: Abnormal   Collection Time: 01/13/23  1:40 PM  Result Value Ref Range   Lactic Acid, Venous 2.4 (HH) 0.5 - 1.9 mmol/L    Comment: CRITICAL RESULT CALLED TO, READ BACK BY AND VERIFIED WITH J,BLUE RN  01/13/23 E,BENTON Performed at [facility], [address], [place], [place] [address]   Sample to Blood Bank     Status: None   Collection Time: 01/13/23  1:40 PM  Result Value Ref Range   Blood Bank Specimen SAMPLE AVAILABLE FOR TESTING    Sample Expiration      01/16/2023,2359 Performed at [facility], [address], [place], [place] [address]   I-Stat Chem 8, ED     Status: Abnormal   Collection Time: 01/13/23  2:35 PM  Result Value Ref Range   Sodium 138 135 - 145 mmol/L   Potassium 3.4 (L) 3.5 - 5.1 mmol/L   Chloride 103 98 - 111 mmol/L   BUN 31 (H) 8 - 23 mg/dL   Creatinine, Ser 5.78 (H) 0.44 - 1.00 mg/dL   Glucose, Bld 469 (H) 70 - 99 mg/dL    Comment: Glucose reference range applies only to samples taken after fasting for at least 8 hours.   Calcium, Ion 1.10 (L) 1.15 - 1.40 mmol/L   TCO2 26 22 - 32 mmol/L   Hemoglobin 11.6 (L) 12.0 - 15.0 g/dL   HCT 62.9 (L) 52.8 - 41.3 %  Urinalysis, Routine w reflex microscopic -Urine, Clean Catch     Status: Abnormal   Collection Time: 01/13/23  2:40 PM  Result Value Ref Range   Color, Urine YELLOW YELLOW   APPearance CLOUDY (A) CLEAR   Specific Gravity, Urine 1.011 1.005 -  1.030   pH 7.0 5.0 - 8.0   Glucose, UA NEGATIVE NEGATIVE mg/dL   Hgb urine dipstick SMALL (A) NEGATIVE   Bilirubin Urine NEGATIVE NEGATIVE   Ketones, ur NEGATIVE NEGATIVE mg/dL   Protein, ur NEGATIVE NEGATIVE mg/dL   Nitrite NEGATIVE NEGATIVE   Leukocytes,Ua TRACE (A) NEGATIVE   RBC / HPF 11-20 0 - 5 RBC/hpf   WBC, UA 0-5 0 - 5 WBC/hpf   Bacteria, UA RARE (A) NONE SEEN   Squamous Epithelial / HPF 0-5 0 - 5 /HPF    Comment: Performed at [facility], [address], [place], [place] [address]   CT CHEST ABDOMEN PELVIS W CONTRAST  Result Date: 01/13/2023 CLINICAL DATA:  Polytrauma, blunt EXAM: CT CHEST, ABDOMEN, AND PELVIS WITH CONTRAST TECHNIQUE: Multidetector CT imaging of the chest, abdomen and pelvis was performed following the standard protocol during bolus administration of intravenous contrast. RADIATION DOSE REDUCTION: This exam was performed according to the departmental dose-optimization program which includes automated exposure control, adjustment of the mA and/or kV according to patient size and/or use of iterative reconstruction technique. CONTRAST:  75mL OMNIPAQUE IOHEXOL 350 MG/ML SOLN COMPARISON:  Same day radiographs FINDINGS: CT CHEST FINDINGS Cardiovascular: No significant vascular findings. Thoracic aorta is nonaneurysmal. Atherosclerotic calcifications of the aorta and coronary arteries. Calcified aortic valve. Central pulmonary vasculature is nondilated. Normal heart size. No pericardial  effusion. Mediastinum/Nodes: No enlarged mediastinal, hilar, or axillary lymph nodes. Thyroid gland, trachea, and esophagus demonstrate no significant findings. Lungs/Pleura: Bandlike scarring or atelectasis within the right middle lobe and to a lesser degree within the lingula. No focal airspace consolidation. No pleural effusion or pneumothorax. Musculoskeletal: Chronic-appearing severe wedge compression fracture of the T12 vertebral body. Remaining thoracic vertebral body heights are  maintained. No chest wall hematoma. CT ABDOMEN PELVIS FINDINGS Hepatobiliary: No focal liver abnormality is seen. No gallstones, gallbladder wall thickening, or biliary dilatation. Pancreas: Unremarkable. No pancreatic ductal dilatation or surrounding inflammatory changes. Spleen: Normal in size without focal abnormality. Adrenals/Urinary Tract: Unremarkable adrenal glands. Punctate 2 mm stone within the upper pole of the left kidney. No right-sided renal calculi. No solid renal lesion. Mild-to-moderate bilateral hydronephrosis. Moderately distended urinary bladder. 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 Remainder of the stomach appears within normal limits. No dilated loops of bowel. No focal bowel wall thickening or inflammatory changes. Vascular/Lymphatic: Aortic atherosclerosis. No enlarged abdominal or pelvic lymph nodes. Reproductive: Status post hysterectomy. No adnexal masses. Other: No free fluid. No abdominopelvic fluid collection. No pneumoperitoneum. No abdominal wall hernia. Musculoskeletal: Acute comminuted intertrochanteric fracture of the right femur with varus angulation. Soft tissue fullness at the fracture site. No well-defined hematoma. Lumbar vertebral body heights are maintained. Pelvic bony ring intact without fracture or diastasis. IMPRESSION: 1. Acute comminuted intertrochanteric fracture of the right femur with varus angulation. 2. Chronic-appearing severe wedge compression fracture of the T12 vertebral body. An acute on chronic component is not excluded. Correlate with point tenderness. 3. Mild-to-moderate bilateral hydronephrosis with moderate distention of the urinary bladder. Correlate for urinary retention or outlet obstruction. 4. Punctate left nephrolithiasis. 5. 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 6. Aortic atherosclerosis (ICD10-I70.0).  Electronically Signed   By: Duanne Guess D.O.   On: 01/13/2023 13:46   CT HEAD WO CONTRAST  Result Date: 01/13/2023 CLINICAL DATA:  Head trauma, moderate-severe; Polytrauma, blunt EXAM: CT HEAD WITHOUT CONTRAST CT CERVICAL SPINE WITHOUT CONTRAST TECHNIQUE: Multidetector CT imaging of the head and cervical spine was performed following the standard protocol without intravenous contrast. Multiplanar CT image reconstructions of the cervical spine were also generated. RADIATION DOSE REDUCTION: This exam was performed according to the departmental dose-optimization program which includes automated exposure control, adjustment of the mA and/or kV according to patient size and/or use of iterative reconstruction technique. COMPARISON:  CT head March 02, 2022. 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 No evidence of acute large vascular territory infarct, acute hemorrhage, mass lesion, or midline shift. Vascular: Calcific atherosclerosis. Skull: No acute fracture. Sinuses/Orbits: Clear sinuses.  No acute orbital findings. Other: No mastoid effusions. CT CERVICAL SPINE FINDINGS Alignment: Mild anterolisthesis of C5 on C6, likely degenerative given facet arthropathy at this level. Otherwise, no substantial sagittal subluxation. Skull base and vertebrae: No evidence of acute fracture. Vertebral body heights are maintained. Osteopenia. Soft tissues and spinal canal: No prevertebral fluid or swelling. No visible canal hematoma. Disc levels: Multilevel facet and uncovertebral hypertrophy with varying degrees of neural foraminal stenosis. 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 Osteopenia. Upper chest: Biapical pleuroparenchymal scarring.  No consolidation. IMPRESSION: No evidence of acute abnormality intracranially or in the cervical spine. Electronically Signed   By: Feliberto Harts M.D.   On: 01/13/2023 13:35   CT CERVICAL SPINE WO CONTRAST  Result Date:  01/13/2023 CLINICAL DATA:  Head trauma, moderate-severe; Polytrauma, blunt EXAM: CT HEAD WITHOUT CONTRAST CT CERVICAL SPINE WITHOUT CONTRAST TECHNIQUE: Multidetector CT imaging of the head and cervical spine was performed following the standard protocol without intravenous contrast. Multiplanar CT image reconstructions of the cervical spine were also generated. RADIATION DOSE REDUCTION: This exam was performed according to the departmental dose-optimization program which includes automated exposure control, adjustment of the mA and/or kV according to patient size and/or use of iterative reconstruction technique. COMPARISON:  CT head March 02, 2022. 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 No evidence of acute large vascular territory infarct, acute hemorrhage, mass lesion, or midline shift. Vascular: Calcific atherosclerosis. Skull: No acute fracture. Sinuses/Orbits: Clear sinuses.  No acute orbital findings. Other: No mastoid effusions. CT CERVICAL SPINE FINDINGS Alignment: Mild anterolisthesis of C5 on C6, likely degenerative given facet arthropathy at this level. Otherwise, no substantial sagittal subluxation. Skull base and vertebrae: No evidence of acute fracture. Vertebral body heights are maintained. Osteopenia. Soft tissues and spinal canal: No prevertebral fluid or swelling. No visible canal hematoma. Disc levels: Multilevel facet and uncovertebral hypertrophy with varying degrees of neural foraminal stenosis. 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 Osteopenia. Upper chest: Biapical pleuroparenchymal scarring.  No consolidation. IMPRESSION: No evidence of acute abnormality intracranially or in the cervical spine. Electronically Signed   By: Feliberto Harts M.D.   On: 01/13/2023 13:35   DG Pelvis Portable  Result Date: 01/13/2023 CLINICAL DATA:  Right leg pain after fall. EXAM: PORTABLE PELVIS 1-2 VIEWS; RIGHT FEMUR 2 VIEWS COMPARISON:  None Available.  FINDINGS: Acute comminuted right intertrochanteric femur fracture with varus angulation. No additional fracture. No dislocation. Mild degenerative changes of both hip joints. Moderate right knee medial compartment osteoarthritis. Diffuse osteopenia. Soft tissues are unremarkable. IMPRESSION: 1. Acute comminuted right intertrochanteric femur fracture. Electronically Signed   By: Obie Dredge M.D.   On: 01/13/2023 12:54   DG FEMUR, MIN 2 VIEWS RIGHT  Result Date: 01/13/2023 CLINICAL DATA:  Right leg pain after fall. EXAM: PORTABLE PELVIS 1-2 VIEWS; RIGHT FEMUR 2 VIEWS COMPARISON:  None Available. FINDINGS: Acute comminuted right intertrochanteric femur fracture with varus angulation. No additional fracture. No dislocation. Mild degenerative changes of both hip joints. Moderate right knee medial compartment osteoarthritis. Diffuse osteopenia. Soft tissues are unremarkable. IMPRESSION: 1. Acute comminuted right intertrochanteric femur fracture. Electronically Signed   By: Obie Dredge M.D.   On: 01/13/2023 12:54   DG Chest Port 1 View  Result Date: 01/13/2023 CLINICAL DATA:  Trauma EXAM: PORTABLE CHEST 1 VIEW COMPARISON:  None Available. FINDINGS: The heart size and mediastinal contours are within normal limits. Aortic atherosclerosis. Both lungs are clear. The visualized skeletal structures are unremarkable. IMPRESSION: No active disease. Electronically Signed   By: Duanne Guess D.O.   On: 01/13/2023 12:53    Pending Labs Unresulted Labs (From admission, onward)     Start     Ordered   01/14/23 0500  Type and screen [facility]  Once,   R       Comments: [facility]    01/13/23 1648   01/14/23 0500  CBC  Tomorrow morning,   R        01/13/23 1648   01/14/23 0500  Basic metabolic panel  Tomorrow morning,   R        01/13/23 1648   01/13/23 1500  Protime-INR  Once,   R  01/13/23 1500            Vitals/Pain Today's Vitals   01/13/23 1630  01/13/23 1645 01/13/23 1646 01/13/23 1657  BP: (!) 148/68 (!) 176/58  (!) 176/58  Pulse: 63 66  69  Resp: 19 (!) 28  (!) 24  Temp:    97.9 F (36.6 C)  TempSrc:    Oral  SpO2: 96% 100%  100%  Weight:    121 lb (54.9 kg)  Height:    5\' 3"  (1.6 m)  PainSc:   7      Isolation Precautions No active isolations  Medications Medications  carvedilol (COREG) tablet 3.125 mg (has no administration in time range)  atorvastatin (LIPITOR) tablet 80 mg (has no administration in time range)  ezetimibe (ZETIA) tablet 10 mg (has no administration in time range)  torsemide (DEMADEX) tablet 40 mg (has no administration in time range)  sacubitril-valsartan (ENTRESTO) 24-26 mg per tablet (has no administration in time range)  levothyroxine (SYNTHROID) tablet 50 mcg (has no administration in time range)  mirtazapine (REMERON) tablet 7.5 mg (has no administration in time range)  sertraline (ZOLOFT) tablet 25 mg (has no administration in time range)  LORazepam (ATIVAN) tablet 0.5 mg (has no administration in time range)  fentaNYL (SUBLIMAZE) injection 25 mcg (has no administration in time range)  polyethylene glycol (MIRALAX / GLYCOLAX) packet 17 g (has no administration in time range)  ondansetron (ZOFRAN) injection 4 mg (has no administration in time range)  fentaNYL (SUBLIMAZE) injection 25 mcg (25 mcg Intravenous Given 01/13/23 1257)  ondansetron (ZOFRAN) injection 4 mg (4 mg Intravenous Given 01/13/23 1230)  iohexol (OMNIPAQUE) 350 MG/ML injection 75 mL (75 mLs Intravenous Contrast Given 01/13/23 1326)  ondansetron (ZOFRAN) injection 4 mg (4 mg Intravenous Given 01/13/23 1614)  fentaNYL (SUBLIMAZE) injection 50 mcg (50 mcg Intravenous Given 01/13/23 1614)    Mobility non-ambulatory due to right femur fx     Focused Assessments Fell from couch 1hr pta on plavix with right angulated femur fx   R Recommendations: See Admitting Provider Note  Report given to:   Additional Notes: [id number]

## 2023-01-13 NOTE — Progress Notes (Signed)
Refused cpap

## 2023-01-14 ENCOUNTER — Encounter (HOSPITAL_COMMUNITY)
Admission: EM | Disposition: A | Discharge status: Skilled Nursing Facility | Payer: Self-pay | Source: Home / Self Care | Attending: Internal Medicine

## 2023-01-14 ENCOUNTER — Encounter (HOSPITAL_COMMUNITY): Payer: Self-pay | Admitting: Internal Medicine

## 2023-01-14 ENCOUNTER — Inpatient Hospital Stay (HOSPITAL_COMMUNITY): Payer: Medicare Other

## 2023-01-14 ENCOUNTER — Other Ambulatory Visit: Payer: Self-pay

## 2023-01-14 ENCOUNTER — Inpatient Hospital Stay (HOSPITAL_COMMUNITY): Payer: Medicare Other | Admitting: Certified Registered"

## 2023-01-14 DIAGNOSIS — I69319 Unspecified symptoms and signs involving cognitive functions following cerebral infarction: Secondary | ICD-10-CM | POA: Diagnosis not present

## 2023-01-14 DIAGNOSIS — E039 Hypothyroidism, unspecified: Secondary | ICD-10-CM

## 2023-01-14 DIAGNOSIS — S72141A Displaced intertrochanteric fracture of right femur, initial encounter for closed fracture: Secondary | ICD-10-CM

## 2023-01-14 DIAGNOSIS — I10 Essential (primary) hypertension: Secondary | ICD-10-CM

## 2023-01-14 DIAGNOSIS — D638 Anemia in other chronic diseases classified elsewhere: Secondary | ICD-10-CM

## 2023-01-14 DIAGNOSIS — N1831 Chronic kidney disease, stage 3a: Secondary | ICD-10-CM | POA: Diagnosis not present

## 2023-01-14 DIAGNOSIS — E034 Atrophy of thyroid (acquired): Secondary | ICD-10-CM

## 2023-01-14 DIAGNOSIS — S72001A Fracture of unspecified part of neck of right femur, initial encounter for closed fracture: Secondary | ICD-10-CM

## 2023-01-14 DIAGNOSIS — I5042 Chronic combined systolic (congestive) and diastolic (congestive) heart failure: Secondary | ICD-10-CM | POA: Diagnosis not present

## 2023-01-14 HISTORY — PX: INTRAMEDULLARY (IM) NAIL INTERTROCHANTERIC: SHX5875

## 2023-01-14 LAB — BASIC METABOLIC PANEL
Anion gap: 11 (ref 5–15)
BUN: 31 mg/dL — ABNORMAL HIGH (ref 8–23)
CO2: 25 mmol/L (ref 22–32)
Calcium: 8.8 mg/dL — ABNORMAL LOW (ref 8.9–10.3)
Chloride: 101 mmol/L (ref 98–111)
Creatinine, Ser: 1.77 mg/dL — ABNORMAL HIGH (ref 0.44–1.00)
GFR, Estimated: 28 mL/min — ABNORMAL LOW (ref >60)
Glucose, Bld: 137 mg/dL — ABNORMAL HIGH (ref 70–99)
Potassium: 3.1 mmol/L — ABNORMAL LOW (ref 3.5–5.1)
Sodium: 137 mmol/L (ref 135–145)

## 2023-01-14 LAB — CBC
HCT: 28.2 % — ABNORMAL LOW (ref 36.0–46.0)
Hemoglobin: 9.5 g/dL — ABNORMAL LOW (ref 12.0–15.0)
MCH: 30.2 pg (ref 26.0–34.0)
MCHC: 33.7 g/dL (ref 30.0–36.0)
MCV: 89.5 fL (ref 80.0–100.0)
Platelets: 196 10*3/uL (ref 150–400)
RBC: 3.15 MIL/uL — ABNORMAL LOW (ref 3.87–5.11)
RDW: 13.2 % (ref 11.5–15.5)
WBC: 8.7 10*3/uL (ref 4.0–10.5)
nRBC: 0 % (ref 0.0–0.2)

## 2023-01-14 LAB — ABO/RH: ABO/RH(D): O POS

## 2023-01-14 SURGERY — FIXATION, FRACTURE, INTERTROCHANTERIC, WITH INTRAMEDULLARY ROD
Anesthesia: General | Site: Hip | Laterality: Right

## 2023-01-14 MED ORDER — METOCLOPRAMIDE HCL 5 MG PO TABS: 5.0000 mg | ORAL_TABLET | Freq: Three times a day (TID) | ORAL | Status: DC | PRN

## 2023-01-14 MED ORDER — VASOPRESSIN 20 UNIT/ML IV SOLN
INTRAVENOUS | Status: AC
  Filled 2023-01-14: qty 1

## 2023-01-14 MED ORDER — LIDOCAINE 2% (20 MG/ML) 5 ML SYRINGE
INTRAMUSCULAR | Status: AC
  Filled 2023-01-14: qty 5

## 2023-01-14 MED ORDER — PHENYLEPHRINE HCL-NACL 20-0.9 MG/250ML-% IV SOLN
INTRAVENOUS | Status: DC | PRN
  Administered 2023-01-14: 25 ug/min via INTRAVENOUS

## 2023-01-14 MED ORDER — VASOPRESSIN 20 UNIT/ML IV SOLN
INTRAVENOUS | Status: DC | PRN
  Administered 2023-01-14: .2 [IU] via INTRAVENOUS
  Administered 2023-01-14: .3 [IU] via INTRAVENOUS
  Administered 2023-01-14: .5 [IU] via INTRAVENOUS
  Administered 2023-01-14: .2 [IU] via INTRAVENOUS

## 2023-01-14 MED ORDER — DEXAMETHASONE SODIUM PHOSPHATE 10 MG/ML IJ SOLN
INTRAMUSCULAR | Status: DC | PRN
  Administered 2023-01-14: 4 mg via INTRAVENOUS

## 2023-01-14 MED ORDER — OXYCODONE HCL 5 MG PO TABS
2.5000 mg | ORAL_TABLET | ORAL | Status: DC | PRN
  Administered 2023-01-15: 2.5 mg via ORAL
  Administered 2023-01-15 (×3): 5 mg via ORAL
  Administered 2023-01-16: 2.5 mg via ORAL
  Administered 2023-01-16 (×2): 5 mg via ORAL
  Administered 2023-01-17 – 2023-01-18 (×3): 2.5 mg via ORAL
  Filled 2023-01-14 (×11): qty 1

## 2023-01-14 MED ORDER — TRANEXAMIC ACID-NACL 1000-0.7 MG/100ML-% IV SOLN
1000.0000 mg | INTRAVENOUS | Status: AC
  Administered 2023-01-14: 1000 mg via INTRAVENOUS

## 2023-01-14 MED ORDER — TRANEXAMIC ACID-NACL 1000-0.7 MG/100ML-% IV SOLN
INTRAVENOUS | Status: AC
  Filled 2023-01-14: qty 100

## 2023-01-14 MED ORDER — PROPOFOL 500 MG/50ML IV EMUL
INTRAVENOUS | Status: DC | PRN
  Administered 2023-01-14: 15 ug/kg/min via INTRAVENOUS

## 2023-01-14 MED ORDER — EPHEDRINE 5 MG/ML INJ
INTRAVENOUS | Status: AC
  Filled 2023-01-14: qty 5

## 2023-01-14 MED ORDER — POVIDONE-IODINE 10 % EX SWAB: 2.0000 | Freq: Once | CUTANEOUS | Status: DC

## 2023-01-14 MED ORDER — PROPOFOL 10 MG/ML IV BOLUS
INTRAVENOUS | Status: DC | PRN
  Administered 2023-01-14: 30 mg via INTRAVENOUS
  Administered 2023-01-14: 70 mg via INTRAVENOUS

## 2023-01-14 MED ORDER — PROPOFOL 10 MG/ML IV BOLUS
INTRAVENOUS | Status: AC
  Filled 2023-01-14: qty 20

## 2023-01-14 MED ORDER — AMISULPRIDE (ANTIEMETIC) 5 MG/2ML IV SOLN: 10.0000 mg | Freq: Once | INTRAVENOUS | Status: DC | PRN

## 2023-01-14 MED ORDER — CEFAZOLIN SODIUM-DEXTROSE 2-4 GM/100ML-% IV SOLN
INTRAVENOUS | Status: AC
  Filled 2023-01-14: qty 100

## 2023-01-14 MED ORDER — PHENYLEPHRINE 80 MCG/ML (10ML) SYRINGE FOR IV PUSH (FOR BLOOD PRESSURE SUPPORT)
PREFILLED_SYRINGE | INTRAVENOUS | Status: AC
  Filled 2023-01-14: qty 10

## 2023-01-14 MED ORDER — CHLORHEXIDINE GLUCONATE 0.12 % MT SOLN
15.0000 mL | Freq: Once | OROMUCOSAL | Status: AC
  Administered 2023-01-14: 15 mL via OROMUCOSAL

## 2023-01-14 MED ORDER — CEFAZOLIN SODIUM-DEXTROSE 2-4 GM/100ML-% IV SOLN
2.0000 g | INTRAVENOUS | Status: AC
  Administered 2023-01-14: 2 g via INTRAVENOUS

## 2023-01-14 MED ORDER — ORAL CARE MOUTH RINSE: 15.0000 mL | Freq: Once | OROMUCOSAL | Status: AC

## 2023-01-14 MED ORDER — METOCLOPRAMIDE HCL 5 MG/ML IJ SOLN
5.0000 mg | Freq: Three times a day (TID) | INTRAMUSCULAR | Status: DC | PRN
  Administered 2023-01-14: 10 mg via INTRAVENOUS
  Filled 2023-01-14: qty 2

## 2023-01-14 MED ORDER — ONDANSETRON HCL 4 MG/2ML IJ SOLN
INTRAMUSCULAR | Status: DC | PRN
  Administered 2023-01-14: 4 mg via INTRAVENOUS

## 2023-01-14 MED ORDER — ACETAMINOPHEN 325 MG PO TABS: 325.0000 mg | ORAL_TABLET | Freq: Four times a day (QID) | ORAL | Status: DC | PRN

## 2023-01-14 MED ORDER — ONDANSETRON HCL 4 MG/2ML IJ SOLN
4.0000 mg | Freq: Four times a day (QID) | INTRAMUSCULAR | Status: DC | PRN
  Filled 2023-01-14: qty 2

## 2023-01-14 MED ORDER — 0.9 % SODIUM CHLORIDE (POUR BTL) OPTIME
TOPICAL | Status: DC | PRN
  Administered 2023-01-14: 1000 mL

## 2023-01-14 MED ORDER — LACTATED RINGERS IV SOLN: INTRAVENOUS | Status: DC | PRN

## 2023-01-14 MED ORDER — FENTANYL CITRATE (PF) 100 MCG/2ML IJ SOLN
INTRAMUSCULAR | Status: AC
  Administered 2023-01-14: 25 ug via INTRAVENOUS
  Filled 2023-01-14: qty 2

## 2023-01-14 MED ORDER — ONDANSETRON HCL 4 MG PO TABS: 4.0000 mg | ORAL_TABLET | Freq: Four times a day (QID) | ORAL | Status: DC | PRN

## 2023-01-14 MED ORDER — PHENYLEPHRINE 80 MCG/ML (10ML) SYRINGE FOR IV PUSH (FOR BLOOD PRESSURE SUPPORT)
PREFILLED_SYRINGE | INTRAVENOUS | Status: DC | PRN
  Administered 2023-01-14: 40 ug via INTRAVENOUS
  Administered 2023-01-14: 80 ug via INTRAVENOUS
  Administered 2023-01-14: 160 ug via INTRAVENOUS
  Administered 2023-01-14: 80 ug via INTRAVENOUS

## 2023-01-14 MED ORDER — FENTANYL CITRATE (PF) 100 MCG/2ML IJ SOLN: 25.0000 ug | INTRAMUSCULAR | Status: DC | PRN

## 2023-01-14 MED ORDER — FENTANYL CITRATE (PF) 250 MCG/5ML IJ SOLN
INTRAMUSCULAR | Status: DC | PRN
  Administered 2023-01-14: 25 ug via INTRAVENOUS
  Administered 2023-01-14: 50 ug via INTRAVENOUS

## 2023-01-14 MED ORDER — EPHEDRINE SULFATE-NACL 50-0.9 MG/10ML-% IV SOSY
PREFILLED_SYRINGE | INTRAVENOUS | Status: DC | PRN
  Administered 2023-01-14 (×2): 5 mg via INTRAVENOUS

## 2023-01-14 MED ORDER — FENTANYL CITRATE (PF) 250 MCG/5ML IJ SOLN
INTRAMUSCULAR | Status: AC
  Filled 2023-01-14: qty 5

## 2023-01-14 MED ORDER — ACETAMINOPHEN 500 MG PO TABS
1000.0000 mg | ORAL_TABLET | Freq: Once | ORAL | Status: AC
  Administered 2023-01-14: 1000 mg via ORAL
  Filled 2023-01-14: qty 2

## 2023-01-14 MED ORDER — CHLORHEXIDINE GLUCONATE 4 % EX LIQD
60.0000 mL | Freq: Once | CUTANEOUS | Status: DC
  Filled 2023-01-14: qty 60

## 2023-01-14 MED ORDER — PHENOL 1.4 % MT LIQD: 1.0000 | OROMUCOSAL | Status: DC | PRN

## 2023-01-14 MED ORDER — POTASSIUM CHLORIDE CRYS ER 20 MEQ PO TBCR
40.0000 meq | EXTENDED_RELEASE_TABLET | Freq: Two times a day (BID) | ORAL | Status: AC
  Administered 2023-01-14 – 2023-01-15 (×4): 40 meq via ORAL
  Filled 2023-01-14 (×4): qty 2

## 2023-01-14 MED ORDER — ROCURONIUM BROMIDE 10 MG/ML (PF) SYRINGE
PREFILLED_SYRINGE | INTRAVENOUS | Status: DC | PRN
  Administered 2023-01-14: 70 mg via INTRAVENOUS
  Administered 2023-01-14: 5 mg via INTRAVENOUS

## 2023-01-14 MED ORDER — CLOPIDOGREL BISULFATE 75 MG PO TABS
75.0000 mg | ORAL_TABLET | Freq: Every day | ORAL | Status: DC
  Administered 2023-01-15 – 2023-01-18 (×4): 75 mg via ORAL
  Filled 2023-01-14 (×4): qty 1

## 2023-01-14 MED ORDER — ALBUMIN HUMAN 5 % IV SOLN: INTRAVENOUS | Status: DC | PRN

## 2023-01-14 MED ORDER — DOCUSATE SODIUM 100 MG PO CAPS
100.0000 mg | ORAL_CAPSULE | Freq: Two times a day (BID) | ORAL | Status: DC
  Administered 2023-01-14 – 2023-01-18 (×8): 100 mg via ORAL
  Filled 2023-01-14 (×8): qty 1

## 2023-01-14 MED ORDER — MENTHOL 3 MG MT LOZG: 1.0000 | LOZENGE | OROMUCOSAL | Status: DC | PRN

## 2023-01-14 MED ORDER — SUGAMMADEX SODIUM 200 MG/2ML IV SOLN
INTRAVENOUS | Status: DC | PRN
  Administered 2023-01-14 (×2): 100 mg via INTRAVENOUS

## 2023-01-14 MED ORDER — GLYCOPYRROLATE PF 0.2 MG/ML IJ SOSY
PREFILLED_SYRINGE | INTRAMUSCULAR | Status: AC
  Filled 2023-01-14: qty 1

## 2023-01-14 MED ORDER — PHENYLEPHRINE HCL-NACL 20-0.9 MG/250ML-% IV SOLN
INTRAVENOUS | Status: AC
  Filled 2023-01-14: qty 250

## 2023-01-14 MED ORDER — PROMETHAZINE HCL 25 MG/ML IJ SOLN: 6.2500 mg | INTRAMUSCULAR | Status: DC | PRN

## 2023-01-14 MED ORDER — ACETAMINOPHEN 325 MG PO TABS
650.0000 mg | ORAL_TABLET | Freq: Four times a day (QID) | ORAL | Status: DC
  Administered 2023-01-14 – 2023-01-18 (×13): 650 mg via ORAL
  Filled 2023-01-14 (×13): qty 2

## 2023-01-14 MED ORDER — LACTATED RINGERS IV SOLN: INTRAVENOUS | Status: DC

## 2023-01-14 MED ORDER — LIDOCAINE 2% (20 MG/ML) 5 ML SYRINGE
INTRAMUSCULAR | Status: DC | PRN
  Administered 2023-01-14: 60 mg via INTRAVENOUS

## 2023-01-14 SURGICAL SUPPLY — 54 items
BAG COUNTER SPONGE SURGICOUNT (BAG) ×1 IMPLANT
BIT DRILL 4.3MMS DISTAL GRDTED (BIT) IMPLANT
BNDG COHESIVE 6X5 TAN ST LF (GAUZE/BANDAGES/DRESSINGS) IMPLANT
BRUSH SCRUB EZ PLAIN DRY (MISCELLANEOUS) ×2 IMPLANT
COVER PERINEAL POST (MISCELLANEOUS) ×1 IMPLANT
COVER SURGICAL LIGHT HANDLE (MISCELLANEOUS) ×2 IMPLANT
DRAPE C-ARM 42X72 X-RAY (DRAPES) ×1 IMPLANT
DRAPE C-ARMOR (DRAPES) ×1 IMPLANT
DRAPE HALF SHEET 40X57 (DRAPES) IMPLANT
DRAPE INCISE IOBAN 66X45 STRL (DRAPES) ×1 IMPLANT
DRAPE ORTHO SPLIT 77X108 STRL (DRAPES)
DRAPE SURG ORHT 6 SPLT 77X108 (DRAPES) IMPLANT
DRAPE U-SHAPE 47X51 STRL (DRAPES) ×1 IMPLANT
DRESSING MEPILEX FLEX 4X4 (GAUZE/BANDAGES/DRESSINGS) ×1 IMPLANT
DRILL 4.3MMS DISTAL GRADUATED (BIT) ×1
DRSG EMULSION OIL 3X3 NADH (GAUZE/BANDAGES/DRESSINGS) ×1 IMPLANT
DRSG MEPILEX FLEX 4X4 (GAUZE/BANDAGES/DRESSINGS) ×3
DRSG MEPILEX POST OP 4X8 (GAUZE/BANDAGES/DRESSINGS) ×1 IMPLANT
ELECT REM PT RETURN 9FT ADLT (ELECTROSURGICAL) ×1
ELECTRODE REM PT RTRN 9FT ADLT (ELECTROSURGICAL) ×1 IMPLANT
GLOVE BIO SURGEON STRL SZ7.5 (GLOVE) ×1 IMPLANT
GLOVE BIO SURGEON STRL SZ8 (GLOVE) ×1 IMPLANT
GLOVE BIOGEL PI IND STRL 7.5 (GLOVE) ×1 IMPLANT
GLOVE BIOGEL PI IND STRL 8 (GLOVE) ×1 IMPLANT
GLOVE SURG ORTHO LTX SZ7.5 (GLOVE) ×2 IMPLANT
GOWN STRL REUS W/ TWL LRG LVL3 (GOWN DISPOSABLE) ×2 IMPLANT
GOWN STRL REUS W/ TWL XL LVL3 (GOWN DISPOSABLE) ×1 IMPLANT
GOWN STRL REUS W/TWL LRG LVL3 (GOWN DISPOSABLE) ×2
GOWN STRL REUS W/TWL XL LVL3 (GOWN DISPOSABLE) ×1
GUIDEPIN VERSANAIL DSP 3.2X444 (ORTHOPEDIC DISPOSABLE SUPPLIES) IMPLANT
GUIDEWIRE BALL NOSE 100CM (WIRE) IMPLANT
HFN RH 130 DEG 9MM X 360MM (Nail) IMPLANT
HIP FRA NAIL LAG SCREW 10.5X90 (Orthopedic Implant) ×1 IMPLANT
KIT BASIN OR (CUSTOM PROCEDURE TRAY) ×1 IMPLANT
KIT TURNOVER KIT B (KITS) ×1 IMPLANT
MANIFOLD NEPTUNE II (INSTRUMENTS) ×1 IMPLANT
NS IRRIG 1000ML POUR BTL (IV SOLUTION) ×1 IMPLANT
PACK GENERAL/GYN (CUSTOM PROCEDURE TRAY) ×1 IMPLANT
PAD ARMBOARD 7.5X6 YLW CONV (MISCELLANEOUS) ×2 IMPLANT
SCREW BONE CORTICAL 5.0X40 (Screw) IMPLANT
SCREW BONE CORTICAL 5.0X42 (Screw) IMPLANT
SCREW LAG HIP FRA NAIL 10.5X90 (Orthopedic Implant) IMPLANT
STAPLER VISISTAT 35W (STAPLE) ×1 IMPLANT
STOCKINETTE IMPERVIOUS LG (DRAPES) IMPLANT
SUT ETHILON 2 0 PSLX (SUTURE) ×1 IMPLANT
SUT VIC AB 0 CT1 27 (SUTURE) ×1
SUT VIC AB 0 CT1 27XBRD ANBCTR (SUTURE) ×1 IMPLANT
SUT VIC AB 1 CT1 27 (SUTURE)
SUT VIC AB 1 CT1 27XBRD ANBCTR (SUTURE) ×1 IMPLANT
SUT VIC AB 2-0 CT1 27 (SUTURE) ×1
SUT VIC AB 2-0 CT1 TAPERPNT 27 (SUTURE) ×1 IMPLANT
TOWEL GREEN STERILE (TOWEL DISPOSABLE) ×2 IMPLANT
TOWEL GREEN STERILE FF (TOWEL DISPOSABLE) ×1 IMPLANT
WATER STERILE IRR 1000ML POUR (IV SOLUTION) ×1 IMPLANT

## 2023-01-14 NOTE — Anesthesia Procedure Notes (Signed)
Procedure Name: Intubation Date/Time: 01/14/2023 5:02 PM  Performed by: Wilder Glade, CRNAPre-anesthesia Checklist: Patient identified, Emergency Drugs available, Suction available, Patient being monitored and Timeout performed Patient Re-evaluated:Patient Re-evaluated prior to induction Oxygen Delivery Method: Circle system utilized Preoxygenation: Pre-oxygenation with 100% oxygen Induction Type: IV induction Ventilation: Mask ventilation without difficulty Laryngoscope Size: Miller and 2 Grade View: Grade I Tube type: Oral Tube size: 6.5 mm Number of attempts: 1 Airway Equipment and Method: Stylet Placement Confirmation: ETT inserted through vocal cords under direct vision, positive ETCO2 and breath sounds checked- equal and bilateral Secured at: 20 cm Tube secured with: Tape Dental Injury: Teeth and Oropharynx as per pre-operative assessment  Comments: Brief atraumatic

## 2023-01-14 NOTE — Consult Note (Signed)
Reason for Consult:Right hip fx Referring Physician: Dorcas Carrow Time called: [id number] Time at bedside: 0902   Tamara Bullock is an 82 y.o. female.  HPI: Tamara Bullock was at home sitting on the arm of a recliner when she lost her balance and fell. She had immediate right hip pain and could not get up. She was brought to the ED where x-rays showed a right hip fx and orthopedic surgery was consulted. She lives in independent living with her husband. She had been using a RW after a stroke last year but had mostly graduated from that lately.  Past Medical History:  Diagnosis Date   Chronic combined systolic and diastolic CHF (congestive heart failure)    Chronic kidney disease (CKD), stage III (moderate)    Essential hypertension    Hyperlipidemia    Hypothyroidism    Stroke     Past Surgical History:  Procedure Laterality Date   ABDOMINAL HYSTERECTOMY     BACK SURGERY      Family History  Problem Relation Age of Onset   Stroke Mother    Heart attack Father     Social History:  reports that she has never smoked. She has never been exposed to tobacco smoke. She has never used smokeless tobacco. She reports that she does not currently use alcohol. She reports that she does not use drugs.  Allergies:  Allergies  Allergen Reactions   Diphenhydramine Hcl Shortness Of Breath    Dermatologicals-iv administration was painful until diluted; can take liquid in small doses by mouth.   Moxifloxacin Hcl In Nacl     Other reaction(s): Other (see comments) Fluoroquinolones-short term stomach and chest pain   Penicillin G Hives   Sulfa Antibiotics Hives   Colistin     Other reaction(s): Other (see comments)   Diclofenac Sodium     Other reaction(s): Other Analgesics-Anti-inflammatory   Esomeprazole     Other reaction(s): Other (see comments)   Estradiol     Other reaction(s): Other (see comments)   Medroxyprogesterone Acetate     Other reaction(s): Other (see comments) Contraceptives    Neomycin-Polymyxin-Hc     Other reaction(s): Other Eye drops   Nitrofurantoin     Other reaction(s): Other Anti-infectives   Nsaids     Other reaction(s): Other (see comments) Can't tolerate OTC anit-inflammatories-can tolerate advil for a short periods   Prednisone     Other reaction(s): Other (see comments) Eye drop   Proparacaine     Other reaction(s): Other (see comments) Used at eye doctor   Rabeprazole     Other reaction(s): Other (see comments) Ulcer drugs-stomach/chest pain, pasty yellow stools   Amitriptyline     Antidepressants   Aspirin     unknown   Azithromycin     unknown   Carbocaine [Mepivacaine]     unknown   Cimetidine     unknown   Ciprofloxacin     unknown   Cisapride     unknown   Clindamycin/Lincomycin     unknown   Cortisporin-Tc [Neomycin-Colist-Hc-Thonzonium]     unknown   Escitalopram     unknown   Hydrochlorothiazide     unknown   Hyoscyamine     Ulcer drugs   Ibuprofen     unknown   Iodine     unknown   Medroxyprogesterone     unknown   Meloxicam    Minocycline     Tetracyclines   Moxifloxacin     Chest pain    Nitrofuran  Derivatives     unknown   Ofloxacin     unknown   Pantoprazole     unknown   Penicillins     unknown   Pneumococcal Vaccines    Prednisolone     unknown   Prevacid [Lansoprazole]    Propoxyphene     -N 100 -Analgesics-opiod   Soap     unknown   Spironolactone     unknown   Tobradex [Tobramycin-Dexamethasone]     unknown   Trolamine Salicylate     unknown   Verapamil     unknown   Voltaren [Diclofenac]     unknown   Diphenhydramine     Other reaction(s): Other (see comments)   Hydrocodone     Other reaction(s): Other (see comments)   Tramadol     Other reaction(s): Other (see comments)    Medications: I have reviewed the patient's current medications.  Results for orders placed or performed during the hospital encounter of 01/13/23 (from the past 48 hour(s))  Comprehensive metabolic  panel     Status: Abnormal   Collection Time: 01/13/23  1:40 PM  Result Value Ref Range   Sodium 139 135 - 145 mmol/L   Potassium 3.6 3.5 - 5.1 mmol/L   Chloride 97 (L) 98 - 111 mmol/L   CO2 24 22 - 32 mmol/L   Glucose, Bld 137 (H) 70 - 99 mg/dL    Comment: Glucose reference range applies only to samples taken after fasting for at least 8 hours.   BUN 30 (H) 8 - 23 mg/dL   Creatinine, Ser 1.16 (H) 0.44 - 1.00 mg/dL   Calcium 9.2 8.9 - 57.9 mg/dL   Total Protein 6.8 6.5 - 8.1 g/dL   Albumin 4.1 3.5 - 5.0 g/dL   AST 33 15 - 41 U/L   ALT 22 0 - 44 U/L   Alkaline Phosphatase 63 38 - 126 U/L   Total Bilirubin 1.0 0.3 - 1.2 mg/dL   GFR, Estimated 36 (L) >60 mL/min    Comment: (NOTE) Calculated using the CKD-EPI Creatinine Equation (2021)    Anion gap 18 (H) 5 - 15    Comment: Performed at [facility], [address], [place], [place] [address]  CBC     Status: Abnormal   Collection Time: 01/13/23  1:40 PM  Result Value Ref Range   WBC 10.1 4.0 - 10.5 K/uL   RBC 3.50 (L) 3.87 - 5.11 MIL/uL   Hemoglobin 10.8 (L) 12.0 - 15.0 g/dL   HCT 38.3 (L) 29.1 - 91.6 %   MCV 89.4 80.0 - 100.0 fL   MCH 30.9 26.0 - 34.0 pg   MCHC 34.5 30.0 - 36.0 g/dL   RDW 60.6 00.4 - 59.9 %   Platelets 247 150 - 400 K/uL   nRBC 0.0 0.0 - 0.2 %    Comment: Performed at [facility], [address], [place], [place] [address]  Ethanol     Status: None   Collection Time: 01/13/23  1:40 PM  Result Value Ref Range   Alcohol, Ethyl (B) <10 <10 mg/dL    Comment: (NOTE) Lowest detectable limit for serum alcohol is 10 mg/dL.  For medical purposes only. Performed at [facility], [address], [place], [place] [address]   Lactic acid, plasma     Status: Abnormal   Collection Time: 01/13/23  1:40 PM  Result Value Ref Range   Lactic Acid, Venous 2.4 (HH) 0.5 -  1.9 mmol/L    Comment: CRITICAL RESULT CALLED TO, READ BACK BY AND VERIFIED WITH J,BLUE RN  01/13/23 E,BENTON Performed at [facility], [address] [place], [place] [address]   Sample to Blood Bank     Status: None   Collection Time: 01/13/23  1:40 PM  Result Value Ref Range   Blood Bank Specimen SAMPLE AVAILABLE FOR TESTING    Sample Expiration      01/16/2023,2359 Performed at [facility], [address] [place], [place] [address]   I-Stat Chem 8, ED     Status: Abnormal   Collection Time: 01/13/23  2:35 PM  Result Value Ref Range   Sodium 138 135 - 145 mmol/L   Potassium 3.4 (L) 3.5 - 5.1 mmol/L   Chloride 103 98 - 111 mmol/L   BUN 31 (H) 8 - 23 mg/dL   Creatinine, Ser 8.65 (H) 0.44 - 1.00 mg/dL   Glucose, Bld 784 (H) 70 - 99 mg/dL    Comment: Glucose reference range applies only to samples taken after fasting for at least 8 hours.   Calcium, Ion 1.10 (L) 1.15 - 1.40 mmol/L   TCO2 26 22 - 32 mmol/L   Hemoglobin 11.6 (L) 12.0 - 15.0 g/dL   HCT 69.6 (L) 29.5 - 28.4 %  Urinalysis, Routine w reflex microscopic -Urine, Clean Catch     Status: Abnormal   Collection Time: 01/13/23  2:40 PM  Result Value Ref Range   Color, Urine YELLOW YELLOW   APPearance CLOUDY (A) CLEAR   Specific Gravity, Urine 1.011 1.005 - 1.030   pH 7.0 5.0 - 8.0   Glucose, UA NEGATIVE NEGATIVE mg/dL   Hgb urine dipstick SMALL (A) NEGATIVE   Bilirubin Urine NEGATIVE NEGATIVE   Ketones, ur NEGATIVE NEGATIVE mg/dL   Protein, ur NEGATIVE NEGATIVE mg/dL   Nitrite NEGATIVE NEGATIVE   Leukocytes,Ua TRACE (A) NEGATIVE   RBC / HPF 11-20 0 - 5 RBC/hpf   WBC, UA 0-5 0 - 5 WBC/hpf   Bacteria, UA RARE (A) NONE SEEN   Squamous Epithelial / HPF 0-5 0 - 5 /HPF    Comment: Performed at [facility], [address] [place], [place] [address]  Protime-INR     Status: Abnormal   Collection Time: 01/13/23  7:12 PM  Result Value Ref Range   Prothrombin Time 15.5 (H) 11.4 - 15.2 seconds   INR 1.2 0.8 - 1.2    Comment: (NOTE) INR goal varies based on device and disease states. Performed at [facility], [address] [place], [place] [address]   Type and screen [facility]     Status: None   Collection Time: 01/14/23  2:05 AM  Result Value Ref Range   ABO/RH(D) O POS    Antibody Screen NEG    Sample Expiration      01/17/2023,2359 Performed at [facility], [address], [place], [place] [address]   CBC     Status: Abnormal   Collection Time: 01/14/23  2:06 AM  Result Value Ref Range   WBC 8.7 4.0 - 10.5 K/uL   RBC 3.15 (L) 3.87 - 5.11 MIL/uL   Hemoglobin 9.5 (L) 12.0 - 15.0 g/dL   HCT 44.0 (L) 34.7 - 42.5 %   MCV 89.5 80.0 - 100.0 fL   MCH 30.2 26.0 - 34.0 pg   MCHC 33.7 30.0 - 36.0 g/dL   RDW 95.6 38.7 -  15.5 %   Platelets 196 150 - 400 K/uL   nRBC 0.0 0.0 - 0.2 %    Comment: Performed at [facility], [address] [place], [place] [address]  Basic metabolic panel     Status: Abnormal   Collection Time: 01/14/23  2:06 AM  Result Value Ref Range   Sodium 137 135 - 145 mmol/L   Potassium 3.1 (L) 3.5 - 5.1 mmol/L   Chloride 101 98 - 111 mmol/L   CO2 25 22 - 32 mmol/L   Glucose, Bld 137 (H) 70 - 99 mg/dL    Comment: Glucose reference range applies only to samples taken after fasting for at least 8 hours.   BUN 31 (H) 8 - 23 mg/dL   Creatinine, Ser 0.98 (H) 0.44 - 1.00 mg/dL   Calcium 8.8 (L) 8.9 - 10.3 mg/dL   GFR, Estimated 28 (L) >60 mL/min    Comment: (NOTE) Calculated using the CKD-EPI Creatinine Equation (2021)    Anion gap 11 5 - 15    Comment: Performed at [facility], [address] [place], [place] [address]    CT CHEST ABDOMEN PELVIS W CONTRAST  Result Date: 01/13/2023 CLINICAL DATA:  Polytrauma, blunt EXAM: CT CHEST, ABDOMEN, AND PELVIS WITH CONTRAST TECHNIQUE: Multidetector CT imaging of the chest, abdomen and pelvis was performed following the standard protocol during bolus administration of intravenous contrast. RADIATION DOSE REDUCTION: This exam was performed according to the departmental dose-optimization program which  includes automated exposure control, adjustment of the mA and/or kV according to patient size and/or use of iterative reconstruction technique. CONTRAST:  75mL OMNIPAQUE IOHEXOL 350 MG/ML SOLN COMPARISON:  Same day radiographs FINDINGS: CT CHEST FINDINGS Cardiovascular: No significant vascular findings. Thoracic aorta is nonaneurysmal. Atherosclerotic calcifications of the aorta and coronary arteries. Calcified aortic valve. Central pulmonary vasculature is nondilated. Normal heart size. No pericardial effusion. Mediastinum/Nodes: No enlarged mediastinal, hilar, or axillary lymph nodes. Thyroid gland, trachea, and esophagus demonstrate no significant findings. Lungs/Pleura: Bandlike scarring or atelectasis within the right middle lobe and to a lesser degree within the lingula. No focal airspace consolidation. No pleural effusion or pneumothorax. Musculoskeletal: Chronic-appearing severe wedge compression fracture of the T12 vertebral body. Remaining thoracic vertebral body heights are maintained. No chest wall hematoma. CT ABDOMEN PELVIS FINDINGS Hepatobiliary: No focal liver abnormality is seen. No gallstones, gallbladder wall thickening, or biliary dilatation. Pancreas: Unremarkable. No pancreatic ductal dilatation or surrounding inflammatory changes. Spleen: Normal in size without focal abnormality. Adrenals/Urinary Tract: Unremarkable adrenal glands. Punctate 2 mm stone within the upper pole of the left kidney. No right-sided renal calculi. No solid renal lesion. Mild-to-moderate bilateral hydronephrosis. Moderately distended urinary bladder. 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 Remainder of the stomach appears within normal limits. No dilated loops of bowel. No focal bowel wall thickening or inflammatory changes. Vascular/Lymphatic: Aortic atherosclerosis. No enlarged abdominal or pelvic lymph nodes. Reproductive: Status post  hysterectomy. No adnexal masses. Other: No free fluid. No abdominopelvic fluid collection. No pneumoperitoneum. No abdominal wall hernia. Musculoskeletal: Acute comminuted intertrochanteric fracture of the right femur with varus angulation. Soft tissue fullness at the fracture site. No well-defined hematoma. Lumbar vertebral body heights are maintained. Pelvic bony ring intact without fracture or diastasis. IMPRESSION: 1. Acute comminuted intertrochanteric fracture of the right femur with varus angulation. 2. Chronic-appearing severe wedge compression fracture of the T12 vertebral body. An acute on chronic component is not excluded.  Correlate with point tenderness. 3. Mild-to-moderate bilateral hydronephrosis with moderate distention of the urinary bladder. Correlate for urinary retention or outlet obstruction. 4. Punctate left nephrolithiasis. 5. 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 6. Aortic atherosclerosis (ICD10-I70.0). Electronically Signed   By: Duanne Guess D.O.   On: 01/13/2023 13:46   CT HEAD WO CONTRAST  Result Date: 01/13/2023 CLINICAL DATA:  Head trauma, moderate-severe; Polytrauma, blunt EXAM: CT HEAD WITHOUT CONTRAST CT CERVICAL SPINE WITHOUT CONTRAST TECHNIQUE: Multidetector CT imaging of the head and cervical spine was performed following the standard protocol without intravenous contrast. Multiplanar CT image reconstructions of the cervical spine were also generated. RADIATION DOSE REDUCTION: This exam was performed according to the departmental dose-optimization program which includes automated exposure control, adjustment of the mA and/or kV according to patient size and/or use of iterative reconstruction technique. COMPARISON:  CT head March 02, 2022. 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 No evidence of acute large vascular territory infarct, acute hemorrhage, mass lesion, or midline shift.  Vascular: Calcific atherosclerosis. Skull: No acute fracture. Sinuses/Orbits: Clear sinuses.  No acute orbital findings. Other: No mastoid effusions. CT CERVICAL SPINE FINDINGS Alignment: Mild anterolisthesis of C5 on C6, likely degenerative given facet arthropathy at this level. Otherwise, no substantial sagittal subluxation. Skull base and vertebrae: No evidence of acute fracture. Vertebral body heights are maintained. Osteopenia. Soft tissues and spinal canal: No prevertebral fluid or swelling. No visible canal hematoma. Disc levels: Multilevel facet and uncovertebral hypertrophy with varying degrees of neural foraminal stenosis. 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 Osteopenia. Upper chest: Biapical pleuroparenchymal scarring.  No consolidation. IMPRESSION: No evidence of acute abnormality intracranially or in the cervical spine. Electronically Signed   By: Feliberto Harts M.D.   On: 01/13/2023 13:35   CT CERVICAL SPINE WO CONTRAST  Result Date: 01/13/2023 CLINICAL DATA:  Head trauma, moderate-severe; Polytrauma, blunt EXAM: CT HEAD WITHOUT CONTRAST CT CERVICAL SPINE WITHOUT CONTRAST TECHNIQUE: Multidetector CT imaging of the head and cervical spine was performed following the standard protocol without intravenous contrast. Multiplanar CT image reconstructions of the cervical spine were also generated. RADIATION DOSE REDUCTION: This exam was performed according to the departmental dose-optimization program which includes automated exposure control, adjustment of the mA and/or kV according to patient size and/or use of iterative reconstruction technique. COMPARISON:  CT head March 02, 2022. 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 No evidence of acute large vascular territory infarct, acute hemorrhage, mass lesion, or midline shift. Vascular: Calcific atherosclerosis. Skull: No acute fracture. Sinuses/Orbits: Clear sinuses.  No acute orbital findings.  Other: No mastoid effusions. CT CERVICAL SPINE FINDINGS Alignment: Mild anterolisthesis of C5 on C6, likely degenerative given facet arthropathy at this level. Otherwise, no substantial sagittal subluxation. Skull base and vertebrae: No evidence of acute fracture. Vertebral body heights are maintained. Osteopenia. Soft tissues and spinal canal: No prevertebral fluid or swelling. No visible canal hematoma. Disc levels: Multilevel facet and uncovertebral hypertrophy with varying degrees of neural foraminal stenosis. 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 Osteopenia. Upper chest: Biapical pleuroparenchymal scarring.  No consolidation. IMPRESSION: No evidence of acute abnormality intracranially or in the cervical spine. Electronically Signed   By: Feliberto Harts M.D.   On: 01/13/2023 13:35   DG Pelvis Portable  Result Date: 01/13/2023 CLINICAL DATA:  Right leg pain after fall. EXAM: PORTABLE PELVIS 1-2 VIEWS; RIGHT FEMUR 2 VIEWS COMPARISON:  None Available. FINDINGS:  Acute comminuted right intertrochanteric femur fracture with varus angulation. No additional fracture. No dislocation. Mild degenerative changes of both hip joints. Moderate right knee medial compartment osteoarthritis. Diffuse osteopenia. Soft tissues are unremarkable. IMPRESSION: 1. Acute comminuted right intertrochanteric femur fracture. Electronically Signed   By: Obie Dredge M.D.   On: 01/13/2023 12:54   DG FEMUR, MIN 2 VIEWS RIGHT  Result Date: 01/13/2023 CLINICAL DATA:  Right leg pain after fall. EXAM: PORTABLE PELVIS 1-2 VIEWS; RIGHT FEMUR 2 VIEWS COMPARISON:  None Available. FINDINGS: Acute comminuted right intertrochanteric femur fracture with varus angulation. No additional fracture. No dislocation. Mild degenerative changes of both hip joints. Moderate right knee medial compartment osteoarthritis. Diffuse osteopenia. Soft tissues are unremarkable. IMPRESSION: 1. Acute comminuted right intertrochanteric femur fracture.  Electronically Signed   By: Obie Dredge M.D.   On: 01/13/2023 12:54   DG Chest Port 1 View  Result Date: 01/13/2023 CLINICAL DATA:  Trauma EXAM: PORTABLE CHEST 1 VIEW COMPARISON:  None Available. FINDINGS: The heart size and mediastinal contours are within normal limits. Aortic atherosclerosis. Both lungs are clear. The visualized skeletal structures are unremarkable. IMPRESSION: No active disease. Electronically Signed   By: Duanne Guess D.O.   On: 01/13/2023 12:53    Review of Systems  HENT:  Negative for ear discharge, ear pain, hearing loss and tinnitus.   Eyes:  Negative for photophobia and pain.  Respiratory:  Negative for cough and shortness of breath.   Cardiovascular:  Negative for chest pain.  Gastrointestinal:  Negative for abdominal pain, nausea and vomiting.  Genitourinary:  Negative for dysuria, flank pain, frequency and urgency.  Musculoskeletal:  Positive for arthralgias (Right hip). Negative for back pain, myalgias and neck pain.  Neurological:  Negative for dizziness and headaches.  Hematological:  Does not bruise/bleed easily.  Psychiatric/Behavioral:  The patient is not nervous/anxious.    Blood pressure (!) 118/50, pulse 60, temperature 98.3 F (36.8 C), temperature source Oral, resp. rate 20, height 5\' 3"  (1.6 m), weight 54.9 kg, SpO2 95 %. Physical Exam Constitutional:      General: She is not in acute distress.    Appearance: She is well-developed. She is not diaphoretic.  HENT:     Head: Normocephalic and atraumatic.  Eyes:     General: No scleral icterus.       Right eye: No discharge.        Left eye: No discharge.     Conjunctiva/sclera: Conjunctivae normal.  Cardiovascular:     Rate and Rhythm: Normal rate and regular rhythm.  Pulmonary:     Effort: Pulmonary effort is normal. No respiratory distress.  Musculoskeletal:     Cervical back: Normal range of motion.     Comments: RLE No traumatic wounds, ecchymosis, or rash  Mod TTP hip  No knee  or ankle effusion  Knee stable to varus/ valgus and anterior/posterior stress  Sens DPN, SPN, TN intact  Motor EHL, ext, flex, evers 5/5  DP 2+, PT 1+, No significant edema  Skin:    General: Skin is warm and dry.  Neurological:     Mental Status: She is alert.  Psychiatric:        Mood and Affect: Mood normal.        Behavior: Behavior normal.     Assessment/Plan: Right hip fx -- Plan IMN today with Dr. Carola Frost. Please keep NPO. Multiple medical problems including  CKD 3A, OSA, hypertension, hyperlipidemia, hypothyroidism, depression, stroke, and CHF -- per primary service  Freeman Caldron, PA-C Orthopedic Surgery [phone] 01/14/2023, 9:11 AM

## 2023-01-14 NOTE — Transfer of Care (Signed)
Immediate Anesthesia Transfer of Care Note  Patient: Tamara Bullock  Procedure(s) Performed: INTRAMEDULLARY (IM) NAIL INTERTROCHANTERIC (Right: Hip)  Patient Location: PACU  Anesthesia Type:General  Level of Consciousness: awake, alert , and patient cooperative  Airway & Oxygen Therapy: Patient Spontanous Breathing and Patient connected to face mask oxygen  Post-op Assessment: Report given to RN and Post -op Vital signs reviewed and stable  Post vital signs: Reviewed and stable  Last Vitals:  Vitals Value Taken Time  BP 117/53 01/14/23 1700  Temp    Pulse 69 01/14/23 1701  Resp 19 01/14/23 1701  SpO2 98 % 01/14/23 1701  Vitals shown include unvalidated device data.  Last Pain:  Vitals:   01/14/23 1232  TempSrc: Oral  PainSc:          Complications: No notable events documented.

## 2023-01-14 NOTE — Anesthesia Postprocedure Evaluation (Signed)
Anesthesia Post Note  Patient: Tamara Bullock  Procedure(s) Performed: INTRAMEDULLARY (IM) NAIL INTERTROCHANTERIC (Right: Hip)     Patient location during evaluation: PACU Anesthesia Type: General Level of consciousness: sedated Pain management: pain level controlled Vital Signs Assessment: post-procedure vital signs reviewed and stable Respiratory status: spontaneous breathing and respiratory function stable Cardiovascular status: stable Postop Assessment: no apparent nausea or vomiting Anesthetic complications: no   No notable events documented.  Last Vitals:  Vitals:   01/14/23 1745 01/14/23 1755  BP: (!) 95/56 (!) 99/39  Pulse: 66 70  Resp: 15 (!) 21  Temp:    SpO2: 97% 94%    Last Pain:  Vitals:   01/14/23 1232  TempSrc: Oral  PainSc:                  Tonique Mendonca DANIEL

## 2023-01-14 NOTE — Progress Notes (Signed)
PROGRESS NOTE    Tamara Bullock  ZOX:[id number] DOB: May 18, 1941 DOA: 01/13/2023 PCP: Vivien Presto, MD    Brief Narrative:  82 year old with history of CKD stage IIIa, stress sleep apnea, hypertension, hyperlipidemia, hypothyroidism, recently stroke and improved mobility after a stroke at home came from home after scooting of her recliner and then slipped off the age and fell on her bottom.  Right hip pain.  Whole body hurts.  In the emergency room hemodynamically stable.  Skeletal survey consistent with multiple old vertebral fractures, right intertrochanteric fracture.  Admitted with surgical consultation.   Assessment & Plan:   Right intertrochanteric closed traumatic fracture: N.p.o., pain medications, bedrest until surgery, SCDs for prophylaxis, scheduled for ORIF today by Dr. Carola Frost.  Vertebral fractures: Old vertebral fractures.  Discussed with neurosurgery.  No surgical indication at this time.  Will focus on mobility after surgery.  Hypokalemia: Replaced before procedure.  Chronic medical issues including Essential hypertension, stable on carvedilol and Entresto.  Continue. Hyperlipidemia, on Lipitor and Zetia.  Continue. Chronic diastolic heart failure, euvolemic now.  On Entresto, Coreg and torsemide.  Continue.  Plavix on hold Hypothyroidism, on Synthroid.  Continued.  Anxiety depression: Will controlled now on Remeron, Zoloft.   DVT prophylaxis: SCDs Start: 01/13/23 1643   Code Status: Full code Family Communication: Daughter at the bedside Disposition Plan: Status is: Inpatient Remains inpatient appropriate because: Inpatient procedures planned     Consultants:  Orthopedics  Procedures:  Plan for ORIF today  Antimicrobials:  None   Subjective: Patient seen and examined.  Hip hurts as well as her back.  Denies any other nausea vomiting or chest pain.  Anxious.  Daughter at the bedside.  Wondering about timing for surgery.  Objective: Vitals:    01/13/23 2005 01/13/23 2322 01/14/23 0455 01/14/23 0748  BP: (!) 143/61 (!) 120/54  (!) 118/50  Pulse: 64 62 65 60  Resp: 20     Temp: 98.4 F (36.9 C)  100.3 F (37.9 C) 98.3 F (36.8 C)  TempSrc: Oral  Axillary Oral  SpO2: 95% 95% 98% 95%  Weight:      Height:        Intake/Output Summary (Last 24 hours) at 01/14/2023 1032 Last data filed at 01/14/2023 0500 Gross per 24 hour  Intake --  Output 651 ml  Net -651 ml   Filed Weights   01/13/23 1657  Weight: 54.9 kg    Examination:  General exam: Appears calm and comfortable  Appropriately anxious.  Frail looking. Respiratory system: No added sounds Cardiovascular system: S1 & S2 heard, RRR. No pedal edema. Gastrointestinal system: Abdomen is nondistended, soft and nontender. No organomegaly or masses felt. Normal bowel sounds heard. Central nervous system: Alert and oriented. No focal neurological deficits. Extremities: Right leg is internally rotated.  Distal neurovascular status intact.    Data Reviewed: I have personally reviewed following labs and imaging studies  CBC: Recent Labs  Lab 01/13/23 1340 01/13/23 1435 01/14/23 0206  WBC 10.1  --  8.7  HGB 10.8* 11.6* 9.5*  HCT 31.3* 34.0* 28.2*  MCV 89.4  --  89.5  PLT 247  --  196   Basic Metabolic Panel: Recent Labs  Lab 01/13/23 1340 01/13/23 1435 01/14/23 0206  NA 139 138 137  K 3.6 3.4* 3.1*  CL 97* 103 101  CO2 24  --  25  GLUCOSE 137* 126* 137*  BUN 30* 31* 31*  CREATININE 1.43* 1.30* 1.77*  CALCIUM 9.2  --  8.8*  GFR: Estimated Creatinine Clearance: 19.9 mL/min (A) (by C-G formula based on SCr of 1.77 mg/dL (H)). Liver Function Tests: Recent Labs  Lab 01/13/23 1340  AST 33  ALT 22  ALKPHOS 63  BILITOT 1.0  PROT 6.8  ALBUMIN 4.1   No results for input(s): "LIPASE", "AMYLASE" in the last 168 hours. No results for input(s): "AMMONIA" in the last 168 hours. Coagulation Profile: Recent Labs  Lab 01/13/23 1912  INR 1.2   Cardiac  Enzymes: No results for input(s): "CKTOTAL", "CKMB", "CKMBINDEX", "TROPONINI" in the last 168 hours. BNP (last 3 results) Recent Labs    04/09/22 1654  PROBNP 1,208*   HbA1C: No results for input(s): "HGBA1C" in the last 72 hours. CBG: No results for input(s): "GLUCAP" in the last 168 hours. Lipid Profile: No results for input(s): "CHOL", "HDL", "LDLCALC", "TRIG", "CHOLHDL", "LDLDIRECT" in the last 72 hours. Thyroid Function Tests: No results for input(s): "TSH", "T4TOTAL", "FREET4", "T3FREE", "THYROIDAB" in the last 72 hours. Anemia Panel: No results for input(s): "VITAMINB12", "FOLATE", "FERRITIN", "TIBC", "IRON", "RETICCTPCT" in the last 72 hours. Sepsis Labs: Recent Labs  Lab 01/13/23 1340  LATICACIDVEN 2.4*    No results found for this or any previous visit (from the past 240 hour(s)).       Radiology Studies: CT CHEST ABDOMEN PELVIS W CONTRAST  Result Date: 01/13/2023 CLINICAL DATA:  Polytrauma, blunt EXAM: CT CHEST, ABDOMEN, AND PELVIS WITH CONTRAST TECHNIQUE: Multidetector CT imaging of the chest, abdomen and pelvis was performed following the standard protocol during bolus administration of intravenous contrast. RADIATION DOSE REDUCTION: This exam was performed according to the departmental dose-optimization program which includes automated exposure control, adjustment of the mA and/or kV according to patient size and/or use of iterative reconstruction technique. CONTRAST:  75mL OMNIPAQUE IOHEXOL 350 MG/ML SOLN COMPARISON:  Same day radiographs FINDINGS: CT CHEST FINDINGS Cardiovascular: No significant vascular findings. Thoracic aorta is nonaneurysmal. Atherosclerotic calcifications of the aorta and coronary arteries. Calcified aortic valve. Central pulmonary vasculature is nondilated. Normal heart size. No pericardial effusion. Mediastinum/Nodes: No enlarged mediastinal, hilar, or axillary lymph nodes. Thyroid gland, trachea, and esophagus demonstrate no significant  findings. Lungs/Pleura: Bandlike scarring or atelectasis within the right middle lobe and to a lesser degree within the lingula. No focal airspace consolidation. No pleural effusion or pneumothorax. Musculoskeletal: Chronic-appearing severe wedge compression fracture of the T12 vertebral body. Remaining thoracic vertebral body heights are maintained. No chest wall hematoma. CT ABDOMEN PELVIS FINDINGS Hepatobiliary: No focal liver abnormality is seen. No gallstones, gallbladder wall thickening, or biliary dilatation. Pancreas: Unremarkable. No pancreatic ductal dilatation or surrounding inflammatory changes. Spleen: Normal in size without focal abnormality. Adrenals/Urinary Tract: Unremarkable adrenal glands. Punctate 2 mm stone within the upper pole of the left kidney. No right-sided renal calculi. No solid renal lesion. Mild-to-moderate bilateral hydronephrosis. Moderately distended urinary bladder. 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 Remainder of the stomach appears within normal limits. No dilated loops of bowel. No focal bowel wall thickening or inflammatory changes. Vascular/Lymphatic: Aortic atherosclerosis. No enlarged abdominal or pelvic lymph nodes. Reproductive: Status post hysterectomy. No adnexal masses. Other: No free fluid. No abdominopelvic fluid collection. No pneumoperitoneum. No abdominal wall hernia. Musculoskeletal: Acute comminuted intertrochanteric fracture of the right femur with varus angulation. Soft tissue fullness at the fracture site. No well-defined hematoma. Lumbar vertebral body heights are maintained. Pelvic bony ring intact without fracture or diastasis. IMPRESSION: 1. Acute comminuted intertrochanteric fracture of the right  femur with varus angulation. 2. Chronic-appearing severe wedge compression fracture of the T12 vertebral body. An acute on chronic component is not excluded. Correlate with  point tenderness. 3. Mild-to-moderate bilateral hydronephrosis with moderate distention of the urinary bladder. Correlate for urinary retention or outlet obstruction. 4. Punctate left nephrolithiasis. 5. 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 6. Aortic atherosclerosis (ICD10-I70.0). Electronically Signed   By: Duanne Guess D.O.   On: 01/13/2023 13:46   CT HEAD WO CONTRAST  Result Date: 01/13/2023 CLINICAL DATA:  Head trauma, moderate-severe; Polytrauma, blunt EXAM: CT HEAD WITHOUT CONTRAST CT CERVICAL SPINE WITHOUT CONTRAST TECHNIQUE: Multidetector CT imaging of the head and cervical spine was performed following the standard protocol without intravenous contrast. Multiplanar CT image reconstructions of the cervical spine were also generated. RADIATION DOSE REDUCTION: This exam was performed according to the departmental dose-optimization program which includes automated exposure control, adjustment of the mA and/or kV according to patient size and/or use of iterative reconstruction technique. COMPARISON:  CT head March 02, 2022. 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 No evidence of acute large vascular territory infarct, acute hemorrhage, mass lesion, or midline shift. Vascular: Calcific atherosclerosis. Skull: No acute fracture. Sinuses/Orbits: Clear sinuses.  No acute orbital findings. Other: No mastoid effusions. CT CERVICAL SPINE FINDINGS Alignment: Mild anterolisthesis of C5 on C6, likely degenerative given facet arthropathy at this level. Otherwise, no substantial sagittal subluxation. Skull base and vertebrae: No evidence of acute fracture. Vertebral body heights are maintained. Osteopenia. Soft tissues and spinal canal: No prevertebral fluid or swelling. No visible canal hematoma. Disc levels: Multilevel facet and uncovertebral hypertrophy with varying degrees of neural foraminal stenosis. Degenerative disc  disease greatest at C5-C6. Osteopenia. Upper chest: Biapical pleuroparenchymal scarring.  No consolidation. IMPRESSION: No evidence of acute abnormality intracranially or in the cervical spine. Electronically Signed   By: Feliberto Harts M.D.   On: 01/13/2023 13:35   CT CERVICAL SPINE WO CONTRAST  Result Date: 01/13/2023 CLINICAL DATA:  Head trauma, moderate-severe; Polytrauma, blunt EXAM: CT HEAD WITHOUT CONTRAST CT CERVICAL SPINE WITHOUT CONTRAST TECHNIQUE: Multidetector CT imaging of the head and cervical spine was performed following the standard protocol without intravenous contrast. Multiplanar CT image reconstructions of the cervical spine were also generated. RADIATION DOSE REDUCTION: This exam was performed according to the departmental dose-optimization program which includes automated exposure control, adjustment of the mA and/or kV according to patient size and/or use of iterative reconstruction technique. COMPARISON:  CT head March 02, 2022. 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 No evidence of acute large vascular territory infarct, acute hemorrhage, mass lesion, or midline shift. Vascular: Calcific atherosclerosis. Skull: No acute fracture. Sinuses/Orbits: Clear sinuses.  No acute orbital findings. Other: No mastoid effusions. CT CERVICAL SPINE FINDINGS Alignment: Mild anterolisthesis of C5 on C6, likely degenerative given facet arthropathy at this level. Otherwise, no substantial sagittal subluxation. Skull base and vertebrae: No evidence of acute fracture. Vertebral body heights are maintained. Osteopenia. Soft tissues and spinal canal: No prevertebral fluid or swelling. No visible canal hematoma. Disc levels: Multilevel facet and uncovertebral hypertrophy with varying degrees of neural foraminal stenosis. 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 Osteopenia. Upper chest: Biapical pleuroparenchymal scarring.  No consolidation. IMPRESSION: No  evidence of acute abnormality intracranially or in the cervical spine. Electronically Signed   By: Feliberto Harts M.D.   On: 01/13/2023 13:35   DG Pelvis Portable  Result  Date: 01/13/2023 CLINICAL DATA:  Right leg pain after fall. EXAM: PORTABLE PELVIS 1-2 VIEWS; RIGHT FEMUR 2 VIEWS COMPARISON:  None Available. FINDINGS: Acute comminuted right intertrochanteric femur fracture with varus angulation. No additional fracture. No dislocation. Mild degenerative changes of both hip joints. Moderate right knee medial compartment osteoarthritis. Diffuse osteopenia. Soft tissues are unremarkable. IMPRESSION: 1. Acute comminuted right intertrochanteric femur fracture. Electronically Signed   By: Obie Dredge M.D.   On: 01/13/2023 12:54   DG FEMUR, MIN 2 VIEWS RIGHT  Result Date: 01/13/2023 CLINICAL DATA:  Right leg pain after fall. EXAM: PORTABLE PELVIS 1-2 VIEWS; RIGHT FEMUR 2 VIEWS COMPARISON:  None Available. FINDINGS: Acute comminuted right intertrochanteric femur fracture with varus angulation. No additional fracture. No dislocation. Mild degenerative changes of both hip joints. Moderate right knee medial compartment osteoarthritis. Diffuse osteopenia. Soft tissues are unremarkable. IMPRESSION: 1. Acute comminuted right intertrochanteric femur fracture. Electronically Signed   By: Obie Dredge M.D.   On: 01/13/2023 12:54   DG Chest Port 1 View  Result Date: 01/13/2023 CLINICAL DATA:  Trauma EXAM: PORTABLE CHEST 1 VIEW COMPARISON:  None Available. FINDINGS: The heart size and mediastinal contours are within normal limits. Aortic atherosclerosis. Both lungs are clear. The visualized skeletal structures are unremarkable. IMPRESSION: No active disease. Electronically Signed   By: Duanne Guess D.O.   On: 01/13/2023 12:53        Scheduled Meds:  atorvastatin  80 mg Oral Daily   carvedilol  3.125 mg Oral BID WC   ezetimibe  10 mg Oral Daily   levothyroxine  50 mcg Oral Q0600   mirtazapine  7.5  mg Oral QHS   potassium chloride  40 mEq Oral BID   sacubitril-valsartan  1 tablet Oral BID   sertraline  25 mg Oral Daily   torsemide  40 mg Oral Daily   Continuous Infusions:  methocarbamol (ROBAXIN) IV 500 mg (01/13/23 2029)     LOS: 1 day    Time spent: 40 minutes    Dorcas Carrow, MD [facility] Pager [phone]

## 2023-01-14 NOTE — Progress Notes (Signed)
Initial Nutrition Assessment  DOCUMENTATION CODES:   Not applicable  INTERVENTION:  Regular diet after surgery in the context of advanced age Magic cup TID with meals, each supplement provides 290 kcal and 9 grams of protein Carnation Instant Breakfast TID- each packet provides 130kcal and 5g protein + milk  NUTRITION DIAGNOSIS:   Increased nutrient needs related to post-op healing, hip fracture as evidenced by estimated needs.  GOAL:   Patient will meet greater than or equal to 90% of their needs  MONITOR:   PO intake, Diet advancement, Labs  REASON FOR ASSESSMENT:   Consult Hip fracture protocol  ASSESSMENT:   Pt with hx of CHF, COPD, CVA, CKD 3, HTN, and HLD presented to ED with hip pain after a fall at home onto her right hip. Found to have a fracture and planned for surgical repair 4/15.  Pt resting in bed at the time of assessment. Daughter at bedside assists with hx as pt is a little lethargic. Reports that typically, pt has a very good appetite and routinely eats all three meals. Lives at an independent living facility that does have a dining room but that can also deliver food to her apartment. Pt sometimes cooks meals as well. States that her weight has increased slightly since losing weight after her CVA.   Some mild deficits noted on exam but likely more attributed to normal aging or previous weight loss with CVA. Pt does not like ensure but is willing to try magic cup and CIB after diet is advanced post-surgery to aid in recovery.  Average Meal Intake: 4/15: 15% intake x 1 recorded meals  Nutritionally Relevant Medications: Scheduled Meds:  atorvastatin  80 mg Oral Daily   ezetimibe  10 mg Oral Daily   mirtazapine  7.5 mg Oral QHS   potassium chloride  40 mEq Oral BID   sertraline  25 mg Oral Daily   torsemide  40 mg Oral Daily   Continuous Infusions:  methocarbamol (ROBAXIN) IV 500 mg (01/13/23 2029)   PRN Meds: ondansetron, polyethylene glycol  Labs  Reviewed: K 3.1 BUN 31, creatinine 1.77  NUTRITION - FOCUSED PHYSICAL EXAM:  Flowsheet Row Most Recent Value  Orbital Region Mild depletion  Upper Arm Region No depletion  Thoracic and Lumbar Region Mild depletion  Buccal Region Mild depletion  Temple Region No depletion  Clavicle Bone Region Mild depletion  Clavicle and Acromion Bone Region Mild depletion  Scapular Bone Region No depletion  Dorsal Hand Severe depletion  Patellar Region No depletion  Anterior Thigh Region No depletion  Posterior Calf Region No depletion  Edema (RD Assessment) None  Hair Reviewed  Eyes Reviewed  Mouth Reviewed  Skin Reviewed  Nails Reviewed    Diet Order:   Diet Order             Diet NPO time specified Except for: Sips with Meds  Diet effective now                   EDUCATION NEEDS:   Education needs have been addressed  Skin:  Skin Assessment: Reviewed RN Assessment  Last BM:  4/14 per flowsheet  Height:   Ht Readings from Last 1 Encounters:  01/13/23 5\' 3"  (1.6 m)    Weight:   Wt Readings from Last 1 Encounters:  01/13/23 54.9 kg    Ideal Body Weight:  52.3 kg  BMI:  Body mass index is 21.43 kg/m.  Estimated Nutritional Needs:   Kcal:  1400-1600 kcal/d  Protein:  65-80g/d  Fluid:  1.5-1.6L/d    Greig Castilla, RD, LDN Clinical Dietitian RD pager # available in [facility]  After hours/weekend pager # available in [facility]

## 2023-01-14 NOTE — Plan of Care (Signed)
Pt with constant moderate to severe pain on her right leg. Pain medicine given every 2 hours with minimal to no relief. Pt exhibits anxiety over severe pain on her right leg. Several beats run of SVT and Vtach observed per tele. [facility] on call notified. Pt denies any chest pain but moans in pain. Ativan p.o given which helped pt calm down. Pt resting at this time. Daughter at the bedside.  Problem: Education: Goal: Knowledge of General Education information will improve Description: Including pain rating scale, medication(s)/side effects and non-pharmacologic comfort measures Outcome: Progressing   Problem: Elimination: Goal: Will not experience complications related to bowel motility Outcome: Progressing   Problem: Safety: Goal: Ability to remain free from injury will improve Outcome: Progressing   Problem: Skin Integrity: Goal: Risk for impaired skin integrity will decrease Outcome: Progressing

## 2023-01-14 NOTE — Progress Notes (Signed)
Patient has Penicillin allergy documented in her chart; Patient has Ancef 2 g ordered by MD. Patient and her family are refusing any antibiotic related to Penicillin. Patient and her family can't specify what kind of reactions patient had because "it happened almost 40 years ago". PA-C was notified.

## 2023-01-14 NOTE — Anesthesia Preprocedure Evaluation (Addendum)
Anesthesia Evaluation  Patient identified by MRN, date of birth, ID band Patient awake    Reviewed: Allergy & Precautions, NPO status , Patient's Chart, lab work & pertinent test results  History of Anesthesia Complications Negative for: history of anesthetic complications  Airway Mallampati: II  TM Distance: >3 FB Neck ROM: Full    Dental  (+) Dental Advisory Given, Upper Dentures   Pulmonary sleep apnea    Pulmonary exam normal        Cardiovascular hypertension, Pt. on medications Normal cardiovascular exam  Myocardial perfusion scan 05/2022 Narrative   The study is normal. The study is low risk.   No ST deviation was noted.   LV perfusion is normal. There is no evidence of ischemia. There is no evidence of infarction.   Left ventricular function is normal. Nuclear stress EF: 75 %. The left ventricular ejection fraction is hyperdynamic (>65%). End diastolic cavity size is normal. End systolic cavity size is normal.   Prior study not available for comparison.   Normal stress nuclear study with no ischemia or infarction.  Gated ejection fraction 75% with normal wall motion. Less    Neuro/Psych  PSYCHIATRIC DISORDERS  Depression    CVA, Residual Symptoms    GI/Hepatic negative GI ROS, Neg liver ROS,,,  Endo/Other  Hypothyroidism    Renal/GU Renal InsufficiencyRenal disease     Musculoskeletal negative musculoskeletal ROS (+)    Abdominal   Peds  Hematology  (+) Blood dyscrasia, anemia   Anesthesia Other Findings   Reproductive/Obstetrics                             Anesthesia Physical Anesthesia Plan  ASA: 3  Anesthesia Plan: General   Post-op Pain Management: Tylenol PO (pre-op)* and Toradol IV (intra-op)*   Induction: Intravenous  PONV Risk Score and Plan: 4 or greater and Ondansetron, Dexamethasone and Diphenhydramine  Airway Management Planned: Oral  ETT  Additional Equipment:   Intra-op Plan:   Post-operative Plan: Extubation in OR  Informed Consent: I have reviewed the patients History and Physical, chart, labs and discussed the procedure including the risks, benefits and alternatives for the proposed anesthesia with the patient or authorized representative who has indicated his/her understanding and acceptance.     Dental advisory given and Consent reviewed with POA  Plan Discussed with: Anesthesiologist and CRNA  Anesthesia Plan Comments:        Anesthesia Quick Evaluation

## 2023-01-15 DIAGNOSIS — E034 Atrophy of thyroid (acquired): Secondary | ICD-10-CM | POA: Diagnosis not present

## 2023-01-15 DIAGNOSIS — N1831 Chronic kidney disease, stage 3a: Secondary | ICD-10-CM | POA: Diagnosis not present

## 2023-01-15 DIAGNOSIS — I5042 Chronic combined systolic (congestive) and diastolic (congestive) heart failure: Secondary | ICD-10-CM | POA: Diagnosis not present

## 2023-01-15 DIAGNOSIS — I69319 Unspecified symptoms and signs involving cognitive functions following cerebral infarction: Secondary | ICD-10-CM | POA: Diagnosis not present

## 2023-01-15 LAB — BPAM RBC
ISSUE DATE / TIME: 202404161200
Unit Type and Rh: 5100

## 2023-01-15 LAB — BASIC METABOLIC PANEL
Anion gap: 11 (ref 5–15)
BUN: 37 mg/dL — ABNORMAL HIGH (ref 8–23)
CO2: 22 mmol/L (ref 22–32)
Calcium: 8.5 mg/dL — ABNORMAL LOW (ref 8.9–10.3)
Chloride: 103 mmol/L (ref 98–111)
Creatinine, Ser: 2.19 mg/dL — ABNORMAL HIGH (ref 0.44–1.00)
GFR, Estimated: 22 mL/min — ABNORMAL LOW (ref >60)
Glucose, Bld: 166 mg/dL — ABNORMAL HIGH (ref 70–99)
Potassium: 4 mmol/L (ref 3.5–5.1)
Sodium: 136 mmol/L (ref 135–145)

## 2023-01-15 LAB — TYPE AND SCREEN: ABO/RH(D): O POS

## 2023-01-15 LAB — CBC
HCT: 21.1 % — ABNORMAL LOW (ref 36.0–46.0)
Hemoglobin: 7 g/dL — ABNORMAL LOW (ref 12.0–15.0)
MCH: 31 pg (ref 26.0–34.0)
MCHC: 33.2 g/dL (ref 30.0–36.0)
MCV: 93.4 fL (ref 80.0–100.0)
Platelets: 175 10*3/uL (ref 150–400)
RBC: 2.26 MIL/uL — ABNORMAL LOW (ref 3.87–5.11)
RDW: 13.4 % (ref 11.5–15.5)
WBC: 11.7 10*3/uL — ABNORMAL HIGH (ref 4.0–10.5)
nRBC: 0 % (ref 0.0–0.2)

## 2023-01-15 LAB — PREPARE RBC (CROSSMATCH)

## 2023-01-15 LAB — VITAMIN D 25 HYDROXY (VIT D DEFICIENCY, FRACTURES): Vit D, 25-Hydroxy: 34.89 ng/mL (ref 30–100)

## 2023-01-15 MED ORDER — SODIUM CHLORIDE 0.9% IV SOLUTION: Freq: Once | INTRAVENOUS | Status: AC

## 2023-01-15 MED ORDER — METHOCARBAMOL 500 MG PO TABS
500.0000 mg | ORAL_TABLET | Freq: Four times a day (QID) | ORAL | Status: DC | PRN
  Administered 2023-01-15 – 2023-01-17 (×3): 500 mg via ORAL
  Filled 2023-01-15 (×3): qty 1

## 2023-01-15 MED ORDER — SODIUM CHLORIDE 0.9 % IV SOLN: INTRAVENOUS | Status: AC

## 2023-01-15 NOTE — TOC CAGE-AID Note (Signed)
Transition of Care [facility]) - CAGE-AID Screening   Patient Details  Name: Tamara Bullock MRN: [id number] Date of Birth: March 31, 1941  Transition of Care [facility]) CM/SW Contact:    Leota Sauers, RN Phone Number: 01/15/2023, 8:12 PM   Clinical Narrative: Patient denies alcohol and drug use. Education not offered at this time.   CAGE-AID Screening:    Have You Ever Felt You Ought to Cut Down on Your Drinking or Drug Use?: No Have People Annoyed You By Critizing Your Drinking Or Drug Use?: No Have You Felt Bad Or Guilty About Your Drinking Or Drug Use?: No Have You Ever Had a Drink or Used Drugs First Thing In The Morning to Steady Your Nerves or to Get Rid of a Hangover?: No CAGE-AID Score: 0  Substance Abuse Education Offered: No

## 2023-01-15 NOTE — Progress Notes (Signed)
Pt refusing cpap for the night. ?

## 2023-01-15 NOTE — Progress Notes (Signed)
PROGRESS NOTE    Tamara Bullock  WUJ:[id number] DOB: April 10, 1941 DOA: 01/13/2023 PCP: Vivien Presto, MD    Brief Narrative:  82 year old with history of CKD stage IIIa, sleep apnea, hypertension, hyperlipidemia, hypothyroidism, recent stroke and improved mobility after a stroke at home came from home after scooting of her recliner and then slipped off the age and fell on her bottom.  Right hip pain.  Whole body hurts.  In the emergency room hemodynamically stable.  Skeletal survey consistent with multiple old vertebral fractures, right intertrochanteric fracture.  Admitted with surgical consultation.   Assessment & Plan:   Right intertrochanteric closed traumatic fracture: Right hip ORIF 4/15 Dr. Carola Frost Weightbearing as tolerated.  PT OT today. Adequate pain medications.  She is a still very painful today.  Continue to use IV and oral pain medications. DVT prophylaxis, Plavix resumed, long-term DVT prophylaxis plan as per orthopedics. Anticipating a SNF next 24 to 48 hours.  Acute blood loss anemia in a patient with chronic anemia: Anticipated from long bone fracture and surgery. Hemoglobin is 7, she has multiple medical issues and cardiovascular comorbidities.  She will benefit with keeping hemoglobin more than 8.  Consented for blood transfusion.  Recheck hemoglobin tomorrow morning.  Acute kidney injury with history of chronic kidney disease stage III AA: Baseline creatinine about 1.3.  Making adequate urine.  No evidence of obstruction.  Keep on maintenance IV fluids and recheck levels tomorrow morning.  Will hold Entresto and diuretics today.  Vertebral fractures: Old vertebral fractures.  Discussed with neurosurgery.  No surgical indication at this time.  Will focus on mobility after surgery.  Hypokalemia: Replaced .  Adequate.  Chronic medical issues including Essential hypertension, stable.  Holding Entresto and diuretics today.  Will continue Coreg. Hyperlipidemia, on  Lipitor and Zetia.  Continue. Chronic diastolic heart failure, euvolemic now.  On Coreg.  Entresto and torsemide on hold.  Continue.  Plavix resumed. Hypothyroidism, on Synthroid.  Continued.  Anxiety depression: Will controlled now on Remeron, Zoloft.   DVT prophylaxis: SCDs Start: 01/14/23 1903 SCDs Start: 01/13/23 1643   Code Status: Full code Family Communication: None today. Disposition Plan: Status is: Inpatient Remains inpatient appropriate because: Immediate postop.     Consultants:  Orthopedics  Procedures:  Plan for ORIF today  Antimicrobials:  None   Subjective:  Patient seen in the morning rounds.  She was very distressed with pain, anxious.  Denies any nausea vomiting.  She was not sure she can work with therapist today.  Objective: Vitals:   01/14/23 2005 01/15/23 0003 01/15/23 0503 01/15/23 0700  BP: (!) 105/45 (!) 113/38 (!) 106/34 110/75  Pulse:      Resp: 17 15 17    Temp:   98.7 F (37.1 C) 98.6 F (37 C)  TempSrc:   Oral Oral  SpO2:   95% 98%  Weight:      Height:        Intake/Output Summary (Last 24 hours) at 01/15/2023 1102 Last data filed at 01/15/2023 0900 Gross per 24 hour  Intake 1570 ml  Output 660 ml  Net 910 ml   Filed Weights   01/13/23 1657  Weight: 54.9 kg    Examination:  General exam: Appears anxious.  In mild distress due to right hip pain. Frail looking. Respiratory system: No added sounds Cardiovascular system: S1 & S2 heard, RRR. No pedal edema. Gastrointestinal system: Abdomen is nondistended, soft and nontender. No organomegaly or masses felt. Normal bowel sounds heard. Central nervous system:  Alert and oriented. No focal neurological deficits. Extremities: Right lateral hip incision clean and dry.  Minimal swelling around.  Dressing intact.  Not removed by me.  Distal neurovascular status intact.    Data Reviewed: I have personally reviewed following labs and imaging studies  CBC: Recent Labs  Lab  01/13/23 1340 01/13/23 1435 01/14/23 0206 01/15/23 0206  WBC 10.1  --  8.7 11.7*  HGB 10.8* 11.6* 9.5* 7.0*  HCT 31.3* 34.0* 28.2* 21.1*  MCV 89.4  --  89.5 93.4  PLT 247  --  196 175   Basic Metabolic Panel: Recent Labs  Lab 01/13/23 1340 01/13/23 1435 01/14/23 0206 01/15/23 0206  NA 139 138 137 136  K 3.6 3.4* 3.1* 4.0  CL 97* 103 101 103  CO2 24  --  25 22  GLUCOSE 137* 126* 137* 166*  BUN 30* 31* 31* 37*  CREATININE 1.43* 1.30* 1.77* 2.19*  CALCIUM 9.2  --  8.8* 8.5*   GFR: Estimated Creatinine Clearance: 16.1 mL/min (A) (by C-G formula based on SCr of 2.19 mg/dL (H)). Liver Function Tests: Recent Labs  Lab 01/13/23 1340  AST 33  ALT 22  ALKPHOS 63  BILITOT 1.0  PROT 6.8  ALBUMIN 4.1   No results for input(s): "LIPASE", "AMYLASE" in the last 168 hours. No results for input(s): "AMMONIA" in the last 168 hours. Coagulation Profile: Recent Labs  Lab 01/13/23 1912  INR 1.2   Cardiac Enzymes: No results for input(s): "CKTOTAL", "CKMB", "CKMBINDEX", "TROPONINI" in the last 168 hours. BNP (last 3 results) Recent Labs    04/09/22 1654  PROBNP 1,208*   HbA1C: No results for input(s): "HGBA1C" in the last 72 hours. CBG: No results for input(s): "GLUCAP" in the last 168 hours. Lipid Profile: No results for input(s): "CHOL", "HDL", "LDLCALC", "TRIG", "CHOLHDL", "LDLDIRECT" in the last 72 hours. Thyroid Function Tests: No results for input(s): "TSH", "T4TOTAL", "FREET4", "T3FREE", "THYROIDAB" in the last 72 hours. Anemia Panel: No results for input(s): "VITAMINB12", "FOLATE", "FERRITIN", "TIBC", "IRON", "RETICCTPCT" in the last 72 hours. Sepsis Labs: Recent Labs  Lab 01/13/23 1340  LATICACIDVEN 2.4*    No results found for this or any previous visit (from the past 240 hour(s)).       Radiology Studies: DG FEMUR PORT, MIN 2 VIEWS RIGHT  Result Date: 01/14/2023 CLINICAL DATA:  Right hip fracture EXAM: RIGHT FEMUR PORTABLE 2 VIEW COMPARISON:   01/13/2023 FINDINGS: Interval ORIF of a comminuted intertrochanteric fracture of the right femur. Long intramedullary rod with distal interlocking screws and proximal lag screw. Improved fracture alignment lesser trochanteric fragment is medially displaced. No new fractures. Hip and knee joint alignment is maintained. Osteoarthritis of the knee. Expected postoperative changes within the soft tissues. IMPRESSION: Interval ORIF of right femur fracture with improved fracture alignment. Electronically Signed   By: Duanne Guess D.O.   On: 01/14/2023 20:51   Pelvis Portable  Result Date: 01/14/2023 CLINICAL DATA:  Postop EXAM: PORTABLE PELVIS 1-2 VIEWS COMPARISON:  Preoperative radiograph FINDINGS: Long intramedullary nail with trans trochanteric and distal locking screw fixation traverse comminuted proximal femur fracture. Improved fracture alignment from preoperative imaging. Recent postsurgical change includes air and edema in the soft tissues. IMPRESSION: ORIF of comminuted proximal femur fracture. No immediate postoperative complication. Electronically Signed   By: Narda Rutherford M.D.   On: 01/14/2023 18:26   DG FEMUR, MIN 2 VIEWS RIGHT  Result Date: 01/14/2023 CLINICAL DATA:  ORIF EXAM: Intraoperative fluoroscopy COMPARISON:  Preop x-ray 05/15/2023 FINDINGS: Six fluoroscopic  spot images submitted for review demonstrate placement of dynamic hip screw with long intramedullary rod. Distal fixation screws. This transfixes the comminuted intertrochanteric hip fracture. Expected alignment. Imaging was obtained to aid in treatment. Please correlate with real-time fluoroscopy of 1 minute and 42 seconds. Cumulative dose 15.75 mGy IMPRESSION: Intraoperative fluoroscopy Electronically Signed   By: Karen Kays M.D.   On: 01/14/2023 16:55   DG C-Arm 1-60 Min-No Report  Result Date: 01/14/2023 Fluoroscopy was utilized by the requesting physician.  No radiographic interpretation.   DG C-Arm 1-60 Min-No  Report  Result Date: 01/14/2023 Fluoroscopy was utilized by the requesting physician.  No radiographic interpretation.   CT CHEST ABDOMEN PELVIS W CONTRAST  Result Date: 01/13/2023 CLINICAL DATA:  Polytrauma, blunt EXAM: CT CHEST, ABDOMEN, AND PELVIS WITH CONTRAST TECHNIQUE: Multidetector CT imaging of the chest, abdomen and pelvis was performed following the standard protocol during bolus administration of intravenous contrast. RADIATION DOSE REDUCTION: This exam was performed according to the departmental dose-optimization program which includes automated exposure control, adjustment of the mA and/or kV according to patient size and/or use of iterative reconstruction technique. CONTRAST:  75mL OMNIPAQUE IOHEXOL 350 MG/ML SOLN COMPARISON:  Same day radiographs FINDINGS: CT CHEST FINDINGS Cardiovascular: No significant vascular findings. Thoracic aorta is nonaneurysmal. Atherosclerotic calcifications of the aorta and coronary arteries. Calcified aortic valve. Central pulmonary vasculature is nondilated. Normal heart size. No pericardial effusion. Mediastinum/Nodes: No enlarged mediastinal, hilar, or axillary lymph nodes. Thyroid gland, trachea, and esophagus demonstrate no significant findings. Lungs/Pleura: Bandlike scarring or atelectasis within the right middle lobe and to a lesser degree within the lingula. No focal airspace consolidation. No pleural effusion or pneumothorax. Musculoskeletal: Chronic-appearing severe wedge compression fracture of the T12 vertebral body. Remaining thoracic vertebral body heights are maintained. No chest wall hematoma. CT ABDOMEN PELVIS FINDINGS Hepatobiliary: No focal liver abnormality is seen. No gallstones, gallbladder wall thickening, or biliary dilatation. Pancreas: Unremarkable. No pancreatic ductal dilatation or surrounding inflammatory changes. Spleen: Normal in size without focal abnormality. Adrenals/Urinary Tract: Unremarkable adrenal glands. Punctate 2 mm stone  within the upper pole of the left kidney. No right-sided renal calculi. No solid renal lesion. Mild-to-moderate bilateral hydronephrosis. Moderately distended urinary bladder. 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 Remainder of the stomach appears within normal limits. No dilated loops of bowel. No focal bowel wall thickening or inflammatory changes. Vascular/Lymphatic: Aortic atherosclerosis. No enlarged abdominal or pelvic lymph nodes. Reproductive: Status post hysterectomy. No adnexal masses. Other: No free fluid. No abdominopelvic fluid collection. No pneumoperitoneum. No abdominal wall hernia. Musculoskeletal: Acute comminuted intertrochanteric fracture of the right femur with varus angulation. Soft tissue fullness at the fracture site. No well-defined hematoma. Lumbar vertebral body heights are maintained. Pelvic bony ring intact without fracture or diastasis. IMPRESSION: 1. Acute comminuted intertrochanteric fracture of the right femur with varus angulation. 2. Chronic-appearing severe wedge compression fracture of the T12 vertebral body. An acute on chronic component is not excluded. Correlate with point tenderness. 3. Mild-to-moderate bilateral hydronephrosis with moderate distention of the urinary bladder. Correlate for urinary retention or outlet obstruction. 4. Punctate left nephrolithiasis. 5. 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 6. Aortic atherosclerosis (ICD10-I70.0). Electronically Signed   By: Duanne Guess D.O.   On: 01/13/2023 13:46   CT HEAD WO CONTRAST  Result Date: 01/13/2023 CLINICAL DATA:  Head trauma, moderate-severe; Polytrauma, blunt EXAM: CT HEAD WITHOUT CONTRAST CT CERVICAL SPINE WITHOUT  CONTRAST TECHNIQUE: Multidetector CT imaging of the head and cervical spine was performed following the standard protocol without intravenous contrast. Multiplanar  CT image reconstructions of the cervical spine were also generated. RADIATION DOSE REDUCTION: This exam was performed according to the departmental dose-optimization program which includes automated exposure control, adjustment of the mA and/or kV according to patient size and/or use of iterative reconstruction technique. COMPARISON:  CT head March 02, 2022. 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 No evidence of acute large vascular territory infarct, acute hemorrhage, mass lesion, or midline shift. Vascular: Calcific atherosclerosis. Skull: No acute fracture. Sinuses/Orbits: Clear sinuses.  No acute orbital findings. Other: No mastoid effusions. CT CERVICAL SPINE FINDINGS Alignment: Mild anterolisthesis of C5 on C6, likely degenerative given facet arthropathy at this level. Otherwise, no substantial sagittal subluxation. Skull base and vertebrae: No evidence of acute fracture. Vertebral body heights are maintained. Osteopenia. Soft tissues and spinal canal: No prevertebral fluid or swelling. No visible canal hematoma. Disc levels: Multilevel facet and uncovertebral hypertrophy with varying degrees of neural foraminal stenosis. 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 Osteopenia. Upper chest: Biapical pleuroparenchymal scarring.  No consolidation. IMPRESSION: No evidence of acute abnormality intracranially or in the cervical spine. Electronically Signed   By: Feliberto Harts M.D.   On: 01/13/2023 13:35   CT CERVICAL SPINE WO CONTRAST  Result Date: 01/13/2023 CLINICAL DATA:  Head trauma, moderate-severe; Polytrauma, blunt EXAM: CT HEAD WITHOUT CONTRAST CT CERVICAL SPINE WITHOUT CONTRAST TECHNIQUE: Multidetector CT imaging of the head and cervical spine was performed following the standard protocol without intravenous contrast. Multiplanar CT image reconstructions of the cervical spine were also generated. RADIATION DOSE REDUCTION: This exam was performed  according to the departmental dose-optimization program which includes automated exposure control, adjustment of the mA and/or kV according to patient size and/or use of iterative reconstruction technique. COMPARISON:  CT head March 02, 2022. 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 No evidence of acute large vascular territory infarct, acute hemorrhage, mass lesion, or midline shift. Vascular: Calcific atherosclerosis. Skull: No acute fracture. Sinuses/Orbits: Clear sinuses.  No acute orbital findings. Other: No mastoid effusions. CT CERVICAL SPINE FINDINGS Alignment: Mild anterolisthesis of C5 on C6, likely degenerative given facet arthropathy at this level. Otherwise, no substantial sagittal subluxation. Skull base and vertebrae: No evidence of acute fracture. Vertebral body heights are maintained. Osteopenia. Soft tissues and spinal canal: No prevertebral fluid or swelling. No visible canal hematoma. Disc levels: Multilevel facet and uncovertebral hypertrophy with varying degrees of neural foraminal stenosis. 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 Osteopenia. Upper chest: Biapical pleuroparenchymal scarring.  No consolidation. IMPRESSION: No evidence of acute abnormality intracranially or in the cervical spine. Electronically Signed   By: Feliberto Harts M.D.   On: 01/13/2023 13:35   DG Pelvis Portable  Result Date: 01/13/2023 CLINICAL DATA:  Right leg pain after fall. EXAM: PORTABLE PELVIS 1-2 VIEWS; RIGHT FEMUR 2 VIEWS COMPARISON:  None Available. FINDINGS: Acute comminuted right intertrochanteric femur fracture with varus angulation. No additional fracture. No dislocation. Mild degenerative changes of both hip joints. Moderate right knee medial compartment osteoarthritis. Diffuse osteopenia. Soft tissues are unremarkable. IMPRESSION: 1. Acute comminuted right intertrochanteric femur fracture. Electronically Signed   By: Obie Dredge M.D.   On:  01/13/2023 12:54   DG FEMUR, MIN 2 VIEWS RIGHT  Result Date: 01/13/2023 CLINICAL DATA:  Right leg pain after fall. EXAM: PORTABLE PELVIS 1-2 VIEWS; RIGHT FEMUR 2 VIEWS  COMPARISON:  None Available. FINDINGS: Acute comminuted right intertrochanteric femur fracture with varus angulation. No additional fracture. No dislocation. Mild degenerative changes of both hip joints. Moderate right knee medial compartment osteoarthritis. Diffuse osteopenia. Soft tissues are unremarkable. IMPRESSION: 1. Acute comminuted right intertrochanteric femur fracture. Electronically Signed   By: Obie Dredge M.D.   On: 01/13/2023 12:54   DG Chest Port 1 View  Result Date: 01/13/2023 CLINICAL DATA:  Trauma EXAM: PORTABLE CHEST 1 VIEW COMPARISON:  None Available. FINDINGS: The heart size and mediastinal contours are within normal limits. Aortic atherosclerosis. Both lungs are clear. The visualized skeletal structures are unremarkable. IMPRESSION: No active disease. Electronically Signed   By: Duanne Guess D.O.   On: 01/13/2023 12:53        Scheduled Meds:  sodium chloride   Intravenous Once   acetaminophen  650 mg Oral Q6H   atorvastatin  80 mg Oral Daily   carvedilol  3.125 mg Oral BID WC   clopidogrel  75 mg Oral Daily   docusate sodium  100 mg Oral BID   ezetimibe  10 mg Oral Daily   levothyroxine  50 mcg Oral Q0600   mirtazapine  7.5 mg Oral QHS   potassium chloride  40 mEq Oral BID   sertraline  25 mg Oral Daily   Continuous Infusions:  sodium chloride     methocarbamol (ROBAXIN) IV 500 mg (01/13/23 2029)     LOS: 2 days    Time spent: 40 minutes    Dorcas Carrow, MD [facility] Pager [phone]

## 2023-01-15 NOTE — Progress Notes (Signed)
PT Cancellation Note  Patient Details Name: Tamara Bullock MRN: [id number] DOB: 29-Sep-1941   Cancelled Treatment:    Reason Eval/Treat Not Completed: Other (comment) Nursing setting up for blood transfusion.  Will check back later today for PT evaluation.  Berton Mount 01/15/2023, 12:10 PM

## 2023-01-15 NOTE — Evaluation (Signed)
Physical Therapy Evaluation Patient Details Name: Tamara Bullock MRN: [id number] DOB: 1941-04-25 Today's Date: 01/15/2023  History of Present Illness  82 year old admitted for Rt hip fracture after a fall; s/p  intramedullary nailing  Rt hip 4/15. history of CKD stage IIIa, stress sleep apnea, hypertension, hyperlipidemia, hypothyroidism, recently stroke and improved mobility after a stroke  Clinical Impression  Patient is s/p above surgery resulting in functional limitations due to the deficits listed below (see PT Problem List). Tolerated bed mobility training well with mod assist to rise to EOB. Max assist for sit<>stand and stand pivot transfer training. Very anxious, leaning posteriorly. After sitting up in recliner pt more anxious, complaining of hypersensitivity in Rt heel, no erythema or edema noted. RN notified of increased pain,  a bit anxious.  Patient will benefit from acute skilled PT to increase their independence and safety with mobility to facilitate discharge.   Patient will benefit from continued inpatient follow up therapy, <3 hours/day      Recommendations for follow up therapy are one component of a multi-disciplinary discharge planning process, led by the attending physician.  Recommendations may be updated based on patient status, additional functional criteria and insurance authorization.  Follow Up Recommendations Can patient physically be transported by private vehicle: No     Assistance Recommended at Discharge Frequent or constant Supervision/Assistance  Patient can return home with the following  Two people to help with walking and/or transfers;A lot of help with bathing/dressing/bathroom;Assistance with cooking/housework;Assist for transportation;Help with stairs or ramp for entrance;Direct supervision/assist for medications management;Direct supervision/assist for financial management    Equipment Recommendations None recommended by PT  Recommendations for  Other Services       Functional Status Assessment Patient has had a recent decline in their functional status and demonstrates the ability to make significant improvements in function in a reasonable and predictable amount of time.     Precautions / Restrictions Precautions Precautions: Fall Restrictions Weight Bearing Restrictions: Yes RLE Weight Bearing: Weight bearing as tolerated      Mobility  Bed Mobility Overal bed mobility: Needs Assistance Bed Mobility: Supine to Sit     Supine to sit: Mod assist, HOB elevated     General bed mobility comments: Mod assist for LE support and trunk, ablet o pull with Lt heel and UE to rise to EOB.    Transfers Overall transfer level: Needs assistance Equipment used: Rolling walker (2 wheels) Transfers: Sit to/from Stand, Bed to chair/wheelchair/BSC Sit to Stand: Max assist Stand pivot transfers: Max assist         General transfer comment: Max assist for boost and balance leaning posteriorly. Cues for technique, performed x2 from bed. Max assist for pivot to recliner. Max VC for sequencing, some RLE WB tolerated, cues for RW use, still leaning posteriorly.    Ambulation/Gait               General Gait Details: deferred  Stairs            Wheelchair Mobility    Modified Rankin (Stroke Patients Only)       Balance Overall balance assessment: Needs assistance Sitting-balance support: No upper extremity supported, Feet supported Sitting balance-Leahy Scale: Fair     Standing balance support: Bilateral upper extremity supported, Reliant on assistive device for balance Standing balance-Leahy Scale: Zero  Pertinent Vitals/Pain Pain Assessment Pain Assessment: Faces Faces Pain Scale: Hurts even more Pain Descriptors / Indicators: Sore Pain Intervention(s): Monitored during session, Repositioned, Limited activity within patient's tolerance    Home Living  Family/patient expects to be discharged to:: Unsure Living Arrangements: Spouse/significant other Available Help at Discharge: Family;Available PRN/intermittently Type of Home: [facility] Home Access: Level entry       Home Layout: One level Home Equipment: Rollator (4 wheels);Cane - single point;Crutches;Shower seat      Prior Function Prior Level of Function : Needs assist;History of Falls (last six months)             Mobility Comments: Occaisional/rare use of rollator. ADLs Comments: Difficulty providing this info. States pt and husband help each other out.     Hand Dominance   Dominant Hand: Right    Extremity/Trunk Assessment   Upper Extremity Assessment Upper Extremity Assessment: Defer to OT evaluation    Lower Extremity Assessment Lower Extremity Assessment: RLE deficits/detail RLE Deficits / Details: guarded as expected RLE: Unable to fully assess due to pain RLE Sensation:  (Hypersensitive Rt heel)       Communication   Communication: Expressive difficulties;HOH  Cognition Arousal/Alertness: Awake/alert Behavior During Therapy: WFL for tasks assessed/performed Overall Cognitive Status: Difficult to assess                                 General Comments: Expressive difficulties.        General Comments General comments (skin integrity, edema, etc.): VSS throughout session. Complains of sensitivity Rt heel after transfer. No erythema noted.    Exercises     Assessment/Plan    PT Assessment Patient needs continued PT services  PT Problem List Decreased strength;Decreased range of motion;Decreased balance;Decreased activity tolerance;Decreased mobility;Decreased knowledge of use of DME;Decreased knowledge of precautions;Pain       PT Treatment Interventions DME instruction;Gait training;Functional mobility training;Therapeutic activities;Therapeutic exercise;Balance training;Neuromuscular  re-education;Patient/family education;Wheelchair mobility training;Cognitive remediation;Modalities    PT Goals (Current goals can be found in the Care Plan section)  Acute Rehab PT Goals Patient Stated Goal: none PT Goal Formulation: Patient unable to participate in goal setting Time For Goal Achievement: 01/29/23 Potential to Achieve Goals: Good    Frequency Min 3X/week     Co-evaluation               AM-PAC PT "6 Clicks" Mobility  Outcome Measure Help needed turning from your back to your side while in a flat bed without using bedrails?: A Lot Help needed moving from lying on your back to sitting on the side of a flat bed without using bedrails?: A Lot Help needed moving to and from a bed to a chair (including a wheelchair)?: A Lot Help needed standing up from a chair using your arms (e.g., wheelchair or bedside chair)?: A Lot Help needed to walk in hospital room?: Total Help needed climbing 3-5 steps with a railing? : Total 6 Click Score: 10    End of Session Equipment Utilized During Treatment: Gait belt;Oxygen Activity Tolerance: Patient limited by pain Patient left: in chair;with call bell/phone within reach;with chair alarm set;with SCD's reapplied;with nursing/sitter in room;with family/visitor present Nurse Communication: Mobility status;Patient requests pain meds;Need for lift equipment PT Visit Diagnosis: Unsteadiness on feet (R26.81);Muscle weakness (generalized) (M62.81);History of falling (Z91.81);Difficulty in walking, not elsewhere classified (R26.2);Pain Pain - Right/Left: Right Pain - part of body: Hip  Time: 1610-9604 PT Time Calculation (min) (ACUTE ONLY): 49 min   Charges:   PT Evaluation $PT Eval Low Complexity: 1 Low PT Treatments $Therapeutic Activity: 23-37 mins        Kathlyn Sacramento, PT, DPT Physical Therapist Acute Rehabilitation Services [facility] Outpatient Rehabilitation Services [facility]   Berton Mount 01/15/2023, 4:38 PM

## 2023-01-15 NOTE — Progress Notes (Signed)
Orthopaedic Trauma Service Progress Note  Patient ID: Tamara Bullock MRN: [id number] DOB/AGE: [birth year] 84 y.o.  Subjective:  Pain tolerable Feels tired  Lives at [facility] in the independent side.  Plans to go to skilled side at dc    ROS As above  Objective:   VITALS:   Vitals:   01/14/23 2005 01/15/23 0003 01/15/23 0503 01/15/23 0700  BP: (!) 105/45 (!) 113/38 (!) 106/34 110/75  Pulse:      Resp: Temp:   98.7 F (37.1 C) 98.6 F (37 C)  TempSrc:   Oral Oral  SpO2:   95% 98%  Weight:      Height:        Estimated body mass index is 21.43 kg/m as calculated from the following:   Height as of this encounter:  (1.6 m).   Weight as of this encounter: 54.9 kg.   Intake/Output      04/15 0701 04/16 0700 04/16 0701 04/17 0700   P.O. 60    I.V. (mL/kg) 1000 (18.2)    IV Piggyback 450    Total Intake(mL/kg) 1510 (27.5)    Urine (mL/kg/hr) 585 (0.4)    Stool     Blood 75    Total Output 660    Net +850         Urine Occurrence 1 x      LABS  Results for orders placed or performed during the hospital encounter of 01/13/23 (from the past 24 hour(s))  VITAMIN D 25 Hydroxy (Vit-D Deficiency, Fractures)     Status: None   Collection Time: 01/15/23  2:06 AM  Result Value Ref Range   Vit D, 25-Hydroxy 34.89 30 - 100 ng/mL  CBC     Status: Abnormal   Collection Time: 01/15/23  2:06 AM  Result Value Ref Range   WBC 11.7 (H) 4.0 - 10.5 K/uL   RBC 2.26 (L) 3.87 - 5.11 MIL/uL   Hemoglobin 7.0 (L) 12.0 - 15.0 g/dL   HCT 62.9 (L) 52.8 - 41.3 %   MCV 93.4 80.0 - 100.0 fL   MCH 31.0 26.0 - 34.0 pg   MCHC 33.2 30.0 - 36.0 g/dL   RDW 24.4 01.0 - 27.2 %   Platelets 175 150 - 400 K/uL   nRBC 0.0 0.0 - 0.2 %  Basic metabolic panel     Status: Abnormal   Collection Time: 01/15/23  2:06 AM  Result Value Ref Range   Sodium 136 135 - 145 mmol/L   Potassium 4.0 3.5 - 5.1  mmol/L   Chloride 103 98 - 111 mmol/L   CO2 22 22 - 32 mmol/L   Glucose, Bld 166 (H) 70 - 99 mg/dL   BUN 37 (H) 8 - 23 mg/dL   Creatinine, Ser 5.36 (H) 0.44 - 1.00 mg/dL   Calcium 8.5 (L) 8.9 - 10.3 mg/dL   GFR, Estimated 22 (L) >60 mL/min   Anion gap 11 5 - 15  Prepare RBC (crossmatch)     Status: None   Collection Time: 01/15/23  7:37 AM  Result Value Ref Range   Order Confirmation      ORDER PROCESSED BY BLOOD BANK Performed at [facility], [address], [place], [place] [address]  PHYSICAL EXAM:   Gen: in bed, NAD, looks tired.  Pale appearing  Ext:       Right Lower Extremity   Scant strike through on dressings R thigh   Minimal swelling  Ext warm   + DP pulse  Distal motor and sensory functions intact   Assessment/Plan: 1 Day Post-Op     Anti-infectives (From admission, onward)    Start     Dose/Rate Route Frequency Ordered Stop   01/14/23 1230  ceFAZolin (ANCEF) IVPB 2g/100 mL premix        2 g 200 mL/hr over 30 Minutes Intravenous On call to O.R. 01/14/23 1227 01/14/23 1520   01/14/23 1227  ceFAZolin (ANCEF) 2-4 GM/100ML-% IVPB       Note to Pharmacy: Shanda Bumps M: cabinet override      01/14/23 1227 01/14/23 1517     .  POD/HD#: 1  82 y/o female s/p fall with right hip fracture  -Right hip fracture s/p intramedullary nailing  Weightbearing WBAT R leg with walker    ROM/Activity   Unrestricted ROM R hip and knee    Wound care   Daily wound care as needed starting tomorrow    PT/OT  Ice prn   - Pain management:  Multimodal  Minimize narcotics  - ABL anemia/Hemodynamics  Recommend 2 units PRBCs   Cbc tomorrow  - Medical issues   Per medicine  - DVT/PE prophylaxis:  Resume home plavix  scds - ID:   Periop abx   - Metabolic Bone Disease:  Vitamin d levels look ok   Fracture mechanism suggestive of osteoporosis (fragility fracture)   Discuss DEXA as outpt  - Activity:  As above  - Impediments to fracture  healing:  Fragility fracture  - Dispo:  Therapy evals   PRBC transfusion     Mearl Latin, PA-C [phone] (C) 01/15/2023, 9:59 AM  [facility] [address] [place] [address] [phone] Val [phone] (F)    After 5pm and on the weekends please log on to Amion, go to orthopaedics and the look under the [facility] Call for the provider(s) on call. You can also call our office at [phone] and then follow the prompts to be connected to the call team.  Patient ID: Tamara Bullock, female   DOB: 1940/12/29, 82 y.o.   MRN: [id number]

## 2023-01-15 NOTE — Progress Notes (Signed)
Nutrition Brief Note  Consult received for assessment of nutritional status/requirements. Full nutrition assessment performed yesterday prior to IMN of R femur fracture.   Nutrition supplements have been ordered and pt remains on regular diet.   Will continue to follow up as appropriate. Please reach out in the meantime with additional nutrition related needs/concerns.   Drusilla Kanner, RDN, LDN Clinical Nutrition

## 2023-01-15 NOTE — Progress Notes (Signed)
Pt refused CPAP

## 2023-01-15 NOTE — Progress Notes (Signed)
OT Cancellation Note  Patient Details Name: Tamara Bullock MRN: [id number] DOB: [birth year]   Cancelled Treatment:    Reason Eval/Treat Not Completed: Patient at procedure or test/ unavailable. Pt currently receiving blood transfusion. OT wil check back tomorrow  Galen Manila 01/15/2023, 2:29 PM

## 2023-01-16 ENCOUNTER — Encounter (HOSPITAL_COMMUNITY): Payer: Self-pay | Admitting: Orthopedic Surgery

## 2023-01-16 ENCOUNTER — Inpatient Hospital Stay (HOSPITAL_COMMUNITY): Payer: Medicare Other

## 2023-01-16 DIAGNOSIS — N1831 Chronic kidney disease, stage 3a: Secondary | ICD-10-CM | POA: Diagnosis not present

## 2023-01-16 DIAGNOSIS — S72141A Displaced intertrochanteric fracture of right femur, initial encounter for closed fracture: Secondary | ICD-10-CM | POA: Diagnosis not present

## 2023-01-16 DIAGNOSIS — I10 Essential (primary) hypertension: Secondary | ICD-10-CM | POA: Diagnosis not present

## 2023-01-16 LAB — CBC
HCT: 27.4 % — ABNORMAL LOW (ref 36.0–46.0)
Hemoglobin: 9.3 g/dL — ABNORMAL LOW (ref 12.0–15.0)
MCH: 29.8 pg (ref 26.0–34.0)
MCHC: 33.9 g/dL (ref 30.0–36.0)
MCV: 87.8 fL (ref 80.0–100.0)
Platelets: 149 10*3/uL — ABNORMAL LOW (ref 150–400)
RBC: 3.12 MIL/uL — ABNORMAL LOW (ref 3.87–5.11)
RDW: 16 % — ABNORMAL HIGH (ref 11.5–15.5)
WBC: 11.8 10*3/uL — ABNORMAL HIGH (ref 4.0–10.5)
nRBC: 0 % (ref 0.0–0.2)

## 2023-01-16 LAB — BASIC METABOLIC PANEL
Anion gap: 8 (ref 5–15)
BUN: 59 mg/dL — ABNORMAL HIGH (ref 8–23)
CO2: 19 mmol/L — ABNORMAL LOW (ref 22–32)
Calcium: 8.4 mg/dL — ABNORMAL LOW (ref 8.9–10.3)
Chloride: 106 mmol/L (ref 98–111)
Creatinine, Ser: 3.21 mg/dL — ABNORMAL HIGH (ref 0.44–1.00)
GFR, Estimated: 14 mL/min — ABNORMAL LOW (ref >60)
Glucose, Bld: 114 mg/dL — ABNORMAL HIGH (ref 70–99)
Potassium: 5.3 mmol/L — ABNORMAL HIGH (ref 3.5–5.1)
Sodium: 133 mmol/L — ABNORMAL LOW (ref 135–145)

## 2023-01-16 LAB — BPAM RBC
Blood Product Expiration Date: 202404232359
Blood Product Expiration Date: 202404242359
ISSUE DATE / TIME: 202404161420
Unit Type and Rh: 5100

## 2023-01-16 LAB — TYPE AND SCREEN
Antibody Screen: NEGATIVE
Unit division: 0
Unit division: 0

## 2023-01-16 MED ORDER — SODIUM CHLORIDE 0.9 % IV SOLN: INTRAVENOUS | Status: AC

## 2023-01-16 MED ORDER — FENTANYL CITRATE PF 50 MCG/ML IJ SOSY
12.5000 ug | PREFILLED_SYRINGE | Freq: Four times a day (QID) | INTRAMUSCULAR | Status: DC | PRN
  Administered 2023-01-17: 12.5 ug via INTRAVENOUS
  Filled 2023-01-16: qty 1

## 2023-01-16 NOTE — Evaluation (Addendum)
Occupational Therapy Evaluation Patient Details Name: Tamara Bullock MRN: [id number] DOB: 1940/12/04 Today's Date: 01/16/2023   History of Present Illness 82 year old admitted for Rt hip fracture after a fall; s/p  intramedullary nailing  Rt hip 4/15. history of CKD stage IIIa, stress sleep apnea, hypertension, hyperlipidemia, hypothyroidism, recently stroke and improved mobility after a stroke   Clinical Impression   PTA pt lives independently with her [facility] at [facility]. Pt anxious and complaining of apin during session (premedicated), however ablet o mobilize to chair with use of Max A and Stedy. Demonstrates a significant decline in functional status as noted below and will benefit from continued inpatient follow up therapy, <3 hours/day. Son present and assisted during session. VSS.     Recommendations for follow up therapy are one component of a multi-disciplinary discharge planning process, led by the attending physician.  Recommendations may be updated based on patient status, additional functional criteria and insurance authorization.   Assistance Recommended at Discharge Frequent or constant Supervision/Assistance  Patient can return home with the following Two people to help with walking and/or transfers;Two people to help with bathing/dressing/bathroom    Functional Status Assessment  Patient has had a recent decline in their functional status and demonstrates the ability to make significant improvements in function in a reasonable and predictable amount of time.  Equipment Recommendations  BSC/3in1 (RW)    Recommendations for Other Services       Precautions / Restrictions Precautions Precautions: Fall Restrictions Weight Bearing Restrictions: Yes RLE Weight Bearing: Weight bearing as tolerated      Mobility Bed Mobility Overal bed mobility: Needs Assistance Bed Mobility: Supine to Sit     Supine to sit: Mod assist, HOB elevated      General bed mobility comments: Mod assist for LE support and trunk, ablet o pull with Lt heel and UE to rise to EOB.    Transfers Overall transfer level: Needs assistance     Sit to Stand: Max assist, +2 safety/equipment Stand pivot transfers: Max assist           Transfer via Lift Equipment: Stedy    Balance Overall balance assessment: Needs assistance Sitting-balance support: No upper extremity supported, Feet supported Sitting balance-Leahy Scale: Fair     Standing balance support: Bilateral upper extremity supported, Reliant on assistive device for balance Standing balance-Leahy Scale: Zero                             ADL either performed or assessed with clinical judgement   ADL Overall ADL's : Needs assistance/impaired Eating/Feeding: Set up   Grooming: Set up;Sitting   Upper Body Bathing: Set up;Supervision/ safety;Sitting   Lower Body Bathing: Maximal assistance;Sit to/from stand   Upper Body Dressing : Minimal assistance   Lower Body Dressing: Total assistance;Sit to/from stand       Toileting- Architect and Hygiene: Total assistance       Functional mobility during ADLs: Maximal assistance;+2 for physical assistance (wiht use of Stedy)       Vision Baseline Vision/History: 0 No visual deficits       Perception     Praxis      Pertinent Vitals/Pain Pain Assessment Pain Assessment: Faces Faces Pain Scale: Hurts even more Pain Descriptors / Indicators: Sore, Grimacing, Guarding Pain Intervention(s): Premedicated before session, Limited activity within patient's tolerance, Repositioned     Hand Dominance Right   Extremity/Trunk Assessment Upper Extremity Assessment Upper  Extremity Assessment: Generalized weakness   Lower Extremity Assessment Lower Extremity Assessment: Defer to PT evaluation RLE Sensation:  (Hypersensitive Rt heel)   Cervical / Trunk Assessment Cervical / Trunk Assessment: Normal    Communication Communication Communication: Expressive difficulties;HOH (since stroke last June)   Cognition Arousal/Alertness: Awake/alert Behavior During Therapy: WFL for tasks assessed/performed Overall Cognitive Status: History of cognitive impairments - at baseline                                 General Comments: Expressive difficulties.     General Comments  VSS; discussed hospital related delirium and strategies to reduce risk of delirium - son verbalized understanding    Exercises     Shoulder Instructions      Home Living Family/patient expects to be discharged to:: Skilled nursing facility Living Arrangements: Spouse/significant other Available Help at Discharge: Family;Available PRN/intermittently Type of Home: [facility] facility [facility]) Home Access: Level entry     Home Layout: One level     Bathroom Shower/Tub: Estate manager/land agent Accessibility: Yes   Home Equipment: Rollator (4 wheels);Cane - single point;Crutches;Shower seat          Prior Functioning/Environment Prior Level of Function : Needs assist;History of Falls (last six months)             Mobility Comments: Occaisional/rare use of rollator. ADLs Comments: Difficulty providing this info. States pt and husband help each other out.        OT Problem List: Decreased strength;Impaired balance (sitting and/or standing);Decreased activity tolerance;Decreased range of motion;Decreased knowledge of use of DME or AE;Decreased knowledge of precautions;Decreased safety awareness;Pain      OT Treatment/Interventions: Self-care/ADL training;Therapeutic exercise;DME and/or AE instruction;Therapeutic activities;Patient/family education;Balance training    OT Goals(Current goals can be found in the care plan section) Acute Rehab OT Goals Patient Stated Goal: to get rehab OT Goal Formulation: With patient/family Time For Goal Achievement:  01/30/23 Potential to Achieve Goals: Good  OT Frequency: Min 2X/week    Co-evaluation              AM-PAC OT "6 Clicks" Daily Activity     Outcome Measure Help from another person eating meals?: A Little Help from another person taking care of personal grooming?: A Little Help from another person toileting, which includes using toliet, bedpan, or urinal?: Total Help from another person bathing (including washing, rinsing, drying)?: A Lot Help from another person to put on and taking off regular upper body clothing?: A Lot Help from another person to put on and taking off regular lower body clothing?: Total 6 Click Score: 12   End of Session Equipment Utilized During Treatment: Gait belt Nurse Communication: Mobility status;Need for lift equipment  Activity Tolerance: Other (comment);Patient tolerated treatment well (anxious) Patient left: in chair;with call bell/phone within reach;with chair alarm set;with family/visitor present  OT Visit Diagnosis: Unsteadiness on feet (R26.81);Other abnormalities of gait and mobility (R26.89);Muscle weakness (generalized) (M62.81);History of falling (Z91.81);Pain Pain - Right/Left: Right Pain - part of body: Leg;Hip                Time: 1050-1130 OT Time Calculation (min): 40 min Charges:  OT General Charges $OT Visit: 1 Visit OT Evaluation $OT Eval Moderate Complexity: 1 Mod OT Treatments $Self Care/Home Management : 23-37 mins  Luisa Dago, OT/L   Acute OT Clinical Specialist Acute Rehabilitation Services Pager [phone] Office [phone]  Charley Lafrance,HILLARY 01/16/2023, 11:38 AM

## 2023-01-16 NOTE — Progress Notes (Signed)
Pt refuses cpap

## 2023-01-16 NOTE — Progress Notes (Signed)
PROGRESS NOTE    Tamara Bullock  RUE:[id number] DOB: [birth year] DOA: 01/13/2023 PCP: Vivien Presto, MD   Brief Narrative:  82 year old with history of CKD stage IIIa, sleep apnea, hypertension, hyperlipidemia, hypothyroidism, recent stroke with impaired mobility presented after a fall and right hip pain.  She was found to have right intertrochanteric fracture and multiple old vertebral fractures.  Orthopedics was consulted.  She underwent ORIF on 01/14/2023.  Subsequently PT recommended SNF placement.  Assessment & Plan:   Right intertrochanteric closed traumatic fracture after mechanical fall -Status post right hip ORIF on 01/14/2023 by Dr. Carola Frost.  Wound care/pain management/DVT prophylaxis as per orthopedics recommendations. -Fall precautions.  PT recommending SNF placement.  TOC following  Acute blood loss anemia in a patient with chronic anemia -Possibly from fracture and perioperative loss.  Status post 2 units packed red cells transfusion on 01/15/2023.  Hemoglobin 9.3 this morning.  Monitor H&H.  Acute kidney injury in a patient with history of CKD stage IIIa -Baseline creatinine of 1.3.  Creatinine worsening to 2.1 today.  Renal ultrasound.  Increase normal saline to 100 cc an hour.  Entresto and diuretics on hold  Old vertebral fractures -Prior hospitalist discussed with neurosurgery: No indication for surgery at this time  Leukocytosis -Mild.  Monitor  Hyponatremia -Mild.  Monitor  Hyperkalemia -Mild.  Monitor  Essential hypertension Chronic diastolic heart failure Hyperlipidemia -Currently compensated.  Blood pressure mostly stable or intermittently on the lower side.  Continue Coreg.  Entresto and torsemide on hold.  Plavix has been resumed -Continue Lipitor and Zetia  Hypothyroidism -Continue levothyroxine  Anxiety/depression -Continue mirtazapine and sertraline  DVT prophylaxis: SCDs Code Status: Full Family Communication: Daughter at  bedside Disposition Plan: Status is: Inpatient Remains inpatient appropriate because: Of severity of illness  Consultants: Orthopedics  Procedures: As above  Antimicrobials: Perioperative   Subjective: Patient seen and examined at bedside.  Poor historian, looks anxious intermittently.  Daughter present at bedside states that she was very confused last evening.  No fever, vomiting, agitation or seizures reported.  Objective: Vitals:   01/15/23 1716 01/15/23 2205 01/16/23 0205 01/16/23 0504  BP: (!) 106/44 (!) 134/40 (!) 145/34 101/69  Pulse: 67     Resp: Temp: 98.3 F (36.8 C)   97.8 F (36.6 C)  TempSrc: Oral   Oral  SpO2: 100%   97%  Weight:      Height:        Intake/Output Summary (Last 24 hours) at 01/16/2023 1156 Last data filed at 01/16/2023 0017 Gross per 24 hour  Intake 831.13 ml  Output 180 ml  Net 651.13 ml   Filed Weights   01/13/23 1657  Weight: 54.9 kg    Examination:  General exam: Appears calm and comfortable.  Looks chronically ill and deconditioned.  On room air. Respiratory system: Bilateral decreased breath sounds at bases with some scattered crackles Cardiovascular system: S1 & S2 heard, Rate controlled Gastrointestinal system: Abdomen is nondistended, soft and nontender. Normal bowel sounds heard. Extremities: No cyanosis, clubbing; trace lower extremity edema  Central nervous system: Awake, slow to respond, poor historian.  No focal neurological deficits. Moving extremities Skin: No rashes, lesions or ulcers Psychiatry: Flat affect.  Not agitated.   Data Reviewed: I have personally reviewed following labs and imaging studies  CBC: Recent Labs  Lab 01/13/23 1340 01/13/23 1435 01/14/23 0206 01/15/23 0206 01/16/23 0119  WBC 10.1  --  8.7 11.7* 11.8*  HGB 10.8* 11.6* 9.5* 7.0*  9.3*  HCT 31.3* 34.0* 28.2* 21.1* 27.4*  MCV 89.4  --  89.5 93.4 87.8  PLT 247  --  196 175 149*   Basic Metabolic Panel: Recent Labs  Lab  01/13/23 1340 01/13/23 1435 01/14/23 0206 01/15/23 0206 01/16/23 0119  NA 139 138 137 136 133*  K 3.6 3.4* 3.1* 4.0 5.3*  CL 97* 103 101 103 106  CO2 24  --  25 22 19*  GLUCOSE 137* 126* 137* 166* 114*  BUN 30* 31* 31* 37* 59*  CREATININE 1.43* 1.30* 1.77* 2.19* 3.21*  CALCIUM 9.2  --  8.8* 8.5* 8.4*   GFR: Estimated Creatinine Clearance: 11 mL/min (A) (by C-G formula based on SCr of 3.21 mg/dL (H)). Liver Function Tests: Recent Labs  Lab 01/13/23 1340  AST 33  ALT 22  ALKPHOS 63  BILITOT 1.0  PROT 6.8  ALBUMIN 4.1   No results for input(s): "LIPASE", "AMYLASE" in the last 168 hours. No results for input(s): "AMMONIA" in the last 168 hours. Coagulation Profile: Recent Labs  Lab 01/13/23 1912  INR 1.2   Cardiac Enzymes: No results for input(s): "CKTOTAL", "CKMB", "CKMBINDEX", "TROPONINI" in the last 168 hours. BNP (last 3 results) Recent Labs    04/09/22 1654  PROBNP 1,208*   HbA1C: No results for input(s): "HGBA1C" in the last 72 hours. CBG: No results for input(s): "GLUCAP" in the last 168 hours. Lipid Profile: No results for input(s): "CHOL", "HDL", "LDLCALC", "TRIG", "CHOLHDL", "LDLDIRECT" in the last 72 hours. Thyroid Function Tests: No results for input(s): "TSH", "T4TOTAL", "FREET4", "T3FREE", "THYROIDAB" in the last 72 hours. Anemia Panel: No results for input(s): "VITAMINB12", "FOLATE", "FERRITIN", "TIBC", "IRON", "RETICCTPCT" in the last 72 hours. Sepsis Labs: Recent Labs  Lab 01/13/23 1340  LATICACIDVEN 2.4*    No results found for this or any previous visit (from the past 240 hour(s)).       Radiology Studies: DG FEMUR PORT, MIN 2 VIEWS RIGHT  Result Date: 01/14/2023 CLINICAL DATA:  Right hip fracture EXAM: RIGHT FEMUR PORTABLE 2 VIEW COMPARISON:  01/13/2023 FINDINGS: Interval ORIF of a comminuted intertrochanteric fracture of the right femur. Long intramedullary rod with distal interlocking screws and proximal lag screw. Improved  fracture alignment lesser trochanteric fragment is medially displaced. No new fractures. Hip and knee joint alignment is maintained. Osteoarthritis of the knee. Expected postoperative changes within the soft tissues. IMPRESSION: Interval ORIF of right femur fracture with improved fracture alignment. Electronically Signed   By: Duanne Guess D.O.   On: 01/14/2023 20:51   Pelvis Portable  Result Date: 01/14/2023 CLINICAL DATA:  Postop EXAM: PORTABLE PELVIS 1-2 VIEWS COMPARISON:  Preoperative radiograph FINDINGS: Long intramedullary nail with trans trochanteric and distal locking screw fixation traverse comminuted proximal femur fracture. Improved fracture alignment from preoperative imaging. Recent postsurgical change includes air and edema in the soft tissues. IMPRESSION: ORIF of comminuted proximal femur fracture. No immediate postoperative complication. Electronically Signed   By: Narda Rutherford M.D.   On: 01/14/2023 18:26   DG FEMUR, MIN 2 VIEWS RIGHT  Result Date: 01/14/2023 CLINICAL DATA:  ORIF EXAM: Intraoperative fluoroscopy COMPARISON:  Preop x-ray 05/15/2023 FINDINGS: Six fluoroscopic spot images submitted for review demonstrate placement of dynamic hip screw with long intramedullary rod. Distal fixation screws. This transfixes the comminuted intertrochanteric hip fracture. Expected alignment. Imaging was obtained to aid in treatment. Please correlate with real-time fluoroscopy of 1 minute and 42 seconds. Cumulative dose 15.75 mGy IMPRESSION: Intraoperative fluoroscopy Electronically Signed   By: Karen Kays  M.D.   On: 01/14/2023 16:55   DG C-Arm 1-60 Min-No Report  Result Date: 01/14/2023 Fluoroscopy was utilized by the requesting physician.  No radiographic interpretation.   DG C-Arm 1-60 Min-No Report  Result Date: 01/14/2023 Fluoroscopy was utilized by the requesting physician.  No radiographic interpretation.        Scheduled Meds:  acetaminophen  650 mg Oral Q6H    atorvastatin  80 mg Oral Daily   carvedilol  3.125 mg Oral BID WC   clopidogrel  75 mg Oral Daily   docusate sodium  100 mg Oral BID   ezetimibe  10 mg Oral Daily   levothyroxine  50 mcg Oral Q0600   mirtazapine  7.5 mg Oral QHS   sertraline  25 mg Oral Daily   Continuous Infusions:  sodium chloride 50 mL/hr at 01/15/23 1714          Charizma Gardiner Hanley Ben, MD [facility] 01/16/2023, 11:56 AM

## 2023-01-16 NOTE — TOC Initial Note (Signed)
Transition of Care [facility]) - Initial/Assessment Note    Patient Details  Name: Tamara Bullock MRN: [id number] Date of Birth: [birth year]  [facility]) CM/SW Contact:    Tamara Frederick, LCSW Phone Number: 01/16/2023, 12:14 PM  Clinical Narrative:    Pt oriented x0, CSW spoke with pt children Tamara Bullock in room.  Pt is from independent living with her husband at [facility].  They would like pt to return to [facility] for STR, have already made arrangements.    Referral sent to [facility], CSW spoke to Tamara Bullock who confirmed plan for pt to return to rehab side there.                 Expected Discharge Plan: Skilled Nursing Facility Barriers to Discharge: Continued Medical Work up   Patient Goals and CMS Choice     Choice offered to / list presented to : Adult Children      Expected Discharge Plan and Services In-house Referral: Clinical Social Work   Post Acute Care Choice: Skilled Nursing Facility Living arrangements for the past 2 months: Independent Living Facility Administrator, arts)                                      Prior Living Arrangements/Services Living arrangements for the past 2 months: Independent Living Facility Administrator, arts) Lives with:: Spouse Patient language and need for interpreter reviewed:: No        Need for Family Participation in Patient Care: Yes (Comment) Care giver support system in place?: Yes (comment) Current home services: Other (comment) (none) Criminal Activity/Legal Involvement Pertinent to Current Situation/Hospitalization: No - Comment as needed  Activities of Daily Living Home Assistive Devices/Equipment: Cane (specify quad or straight), Walker (specify type) ADL Screening (condition at time of admission) Patient's cognitive ability adequate to safely complete daily activities?: No Is the patient deaf or have difficulty hearing?: No Does the patient have difficulty seeing, even when  wearing glasses/contacts?: No Does the patient have difficulty concentrating, remembering, or making decisions?: Yes Patient able to express need for assistance with ADLs?: Yes Does the patient have difficulty dressing or bathing?: Yes Independently performs ADLs?: Yes (appropriate for developmental age) Does the patient have difficulty walking or climbing stairs?: Yes Weakness of Legs: Both Weakness of Arms/Hands: None  Permission Sought/Granted                  Emotional Assessment Appearance:: Appears stated age Attitude/Demeanor/Rapport: Unable to Assess Affect (typically observed): Unable to Assess Orientation: :  (not oriented)      Admission diagnosis:  Fall, initial encounter [W19.XXXA] Closed displaced intertrochanteric fracture of right femur, initial encounter [S72.141A] Closed comminuted intertrochanteric fracture of right femur [S72.141A] Patient Active Problem List   Diagnosis Date Noted   Closed comminuted intertrochanteric fracture of right femur 01/13/2023   OSA (obstructive sleep apnea) 07/18/2022   Hyperlipidemia    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 (CVA) 03/12/2022   History of stroke 03/07/2022   Chronic combined systolic and diastolic CHF (congestive heart failure) 03/04/2022   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   Essential hypertension 03/03/2022   Stage 3a chronic kidney disease (CKD) 03/03/2022   Depression 03/03/2022   Hypothyroidism 03/03/2022   PCP:  Vivien Presto, MD Pharmacy:   [facility] [place], [place] - [address] [place]  [place] [address] Phone: [phone] Fax: [phone]     Social Determinants of Health (SDOH) Social History: SDOH Screenings   Food Insecurity: No Food Insecurity (01/13/2023)  Housing: Low Risk  (01/13/2023)  Transportation Needs: No Transportation Needs (01/13/2023)  Utilities: Not At Risk (01/13/2023)  Depression (PHQ2-9): Low Risk   (05/17/2022)  Recent Concern: Depression (PHQ2-9) - High Risk (04/04/2022)  Tobacco Use: Low Risk  (01/14/2023)   SDOH Interventions:     Readmission Risk Interventions     No data to display

## 2023-01-16 NOTE — Progress Notes (Signed)
Orthopaedic Trauma Service Progress Note  Patient ID: Tamara Bullock MRN: [id number] DOB/AGE: 1941-07-22 82 y.o.  Subjective:  Tolerated transfusion yesterday  Did some transfers with therapy  No acute issues   ROS As above  Objective:   VITALS:   Vitals:   01/15/23 1716 01/15/23 2205 01/16/23 0205 01/16/23 0504  BP: (!) 106/44 (!) 134/40 (!) 145/34 101/69  Pulse: 67     Resp: 20 19 13 17   Temp: 98.3 F (36.8 C)   97.8 F (36.6 C)  TempSrc: Oral   Oral  SpO2: 100%   97%  Weight:      Height:        Estimated body mass index is 21.43 kg/m as calculated from the following:   Height as of this encounter: 5\' 3"  (1.6 m).   Weight as of this encounter: 54.9 kg.   Intake/Output      04/16 0701 04/17 0700 04/17 0701 04/18 0700   P.O. 240    I.V. (mL/kg) 47 (0.9)    Blood 664.2    IV Piggyback     Total Intake(mL/kg) 951.1 (17.3)    Urine (mL/kg/hr) 180 (0.1)    Blood     Total Output 180    Net +771.1           LABS  Results for orders placed or performed during the hospital encounter of 01/13/23 (from the past 24 hour(s))  Prepare RBC (crossmatch)     Status: None   Collection Time: 01/15/23 11:45 AM  Result Value Ref Range   Order Confirmation      ORDER PROCESSED BY BLOOD BANK Performed at [facility], [address], [place], [place] [address]   CBC     Status: Abnormal   Collection Time: 01/16/23  1:19 AM  Result Value Ref Range   WBC 11.8 (H) 4.0 - 10.5 K/uL   RBC 3.12 (L) 3.87 - 5.11 MIL/uL   Hemoglobin 9.3 (L) 12.0 - 15.0 g/dL   HCT 62.1 (L) 30.8 - 65.7 %   MCV 87.8 80.0 - 100.0 fL   MCH 29.8 26.0 - 34.0 pg   MCHC 33.9 30.0 - 36.0 g/dL   RDW 84.6 (H) 96.2 - 95.2 %   Platelets 149 (L) 150 - 400 K/uL   nRBC 0.0 0.0 - 0.2 %  Basic metabolic panel     Status: Abnormal   Collection Time: 01/16/23  1:19 AM  Result Value Ref Range   Sodium 133 (L) 135 - 145 mmol/L    Potassium 5.3 (H) 3.5 - 5.1 mmol/L   Chloride 106 98 - 111 mmol/L   CO2 19 (L) 22 - 32 mmol/L   Glucose, Bld 114 (H) 70 - 99 mg/dL   BUN 59 (H) 8 - 23 mg/dL   Creatinine, Ser 8.41 (H) 0.44 - 1.00 mg/dL   Calcium 8.4 (L) 8.9 - 10.3 mg/dL   GFR, Estimated 14 (L) >60 mL/min   Anion gap 8 5 - 15     PHYSICAL EXAM:   Gen: in bed, NAD, better color today Ext:       Right Lower Extremity              Scant strike through on dressings R thigh  Minimal swelling             Ext warm              + DP pulse             Distal motor and sensory functions intact    Assessment/Plan: 2 Days Post-Op   Principal Problem:   Closed comminuted intertrochanteric fracture of right femur Active Problems:   Essential hypertension   Stage 3a chronic kidney disease (CKD)   Depression   Hypothyroidism   Chronic combined systolic and diastolic CHF (congestive heart failure)   History of stroke   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 01/14/23 1227 01/14/23 1520   01/14/23 1227  ceFAZolin (ANCEF) 2-4 GM/100ML-% IVPB       Note to Pharmacy: Shanda Bumps M: cabinet override      01/14/23 1227 01/14/23 1517     .  POD/HD#: 2  82 y/o female s/p fall with right hip fracture   -Right hip fracture s/p intramedullary nailing  Weightbearing WBAT R leg with walker                ROM/Activity                         Unrestricted ROM R hip and knee                Wound care                         Daily wound care as needed               PT/OT             Ice prn    - Pain management:             Multimodal             Minimize narcotics   - ABL anemia/Hemodynamics              appropriate response s/p 2 units PRBCs  Monitor s   - Medical issues              Per medicine   - DVT/PE prophylaxis:             Resume home plavix             scds - ID:              Periop abx    - Metabolic Bone Disease:             Vitamin d levels look ok              Fracture mechanism suggestive of osteoporosis (fragility fracture)               Discuss DEXA as outpt   - Activity:             As above   - Impediments to fracture healing:             Fragility fracture   - Dispo:  continue with therapies   Family states they have bed waiting for SNF side at [facility]  Mearl Latin, PA-C [phone] (C) 01/16/2023, 9:50 AM  [facility] [address] [place] [address] [phone] Collier Bullock (F)    After 5pm and on the weekends please log on to Amion, go to orthopaedics and the look under the [facility] Call for the provider(s) on call. You can also call our office at [phone] and then follow the prompts to be connected to the call team.  Patient ID: Marty Heck, female   DOB: 1941-01-05, 82 y.o.   MRN: [id number]

## 2023-01-16 NOTE — Care Management Important Message (Signed)
Important Message  Patient Details  Name: Tamara Bullock MRN: [id number] Date of Birth: [birth year]   Medicare Important Message Given:  Yes     Sherilyn Banker 01/16/2023, 11:42 AM

## 2023-01-16 NOTE — NC FL2 (Signed)
[place] MEDICAID [facility] LEVEL OF CARE FORM     IDENTIFICATION  Patient Name: Tamara Bullock Birthdate: 29-Sep-1941 Sex: female Admission Date (Current Location): 01/13/2023  [facility] and [place] Number:  Producer, television/film/video and Address:  [facility], 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) 03/12/2022   History of stroke 03/07/2022   Chronic combined systolic and diastolic CHF (congestive heart failure) 03/04/2022   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   Essential hypertension 03/03/2022   Stage 3a chronic kidney disease (CKD) 03/03/2022   Depression 03/03/2022   Hypothyroidism 03/03/2022    Orientation RESPIRATION BLADDER Height & Weight      (not oriented)  O2 Incontinent Weight: 121 lb (54.9 kg) Height:   (160 cm)  BEHAVIORAL SYMPTOMS/MOOD NEUROLOGICAL BOWEL NUTRITION STATUS      Continent Diet (see discharge summary)  AMBULATORY STATUS COMMUNICATION OF NEEDS Skin   Total Care Verbally Surgical wounds, Other (Comment) (redness)                       Personal Care Assistance Level of Assistance  Bathing, Feeding, Dressing, Total care Bathing Assistance: Maximum assistance Feeding assistance: Limited assistance Dressing Assistance: Maximum assistance Total Care Assistance: Maximum assistance    Functional Limitations Info  Sight, Hearing, Speech Sight Info: Adequate Hearing Info: Impaired Speech Info: Adequate    SPECIAL CARE FACTORS FREQUENCY  PT (By licensed PT), OT (By licensed OT)     PT Frequency: 5x week OT Frequency: 5x week            Contractures Contractures Info: Not present    Additional Factors Info  Code Status, Allergies Code Status Info: full Allergies Info: Diphenhydramine Hcl, Moxifloxacin Hcl In Nacl, Penicillin G, Sulfa Antibiotics, Colistin, Diclofenac Sodium, Esomeprazole, Estradiol, Medroxyprogesterone Acetate, Neomycin-polymyxin-hc, Nitrofurantoin, Nsaids, Prednisone, Proparacaine, Rabeprazole, Amitriptyline, Aspirin, Azithromycin, Carbocaine (Mepivacaine), Cimetidine, Ciprofloxacin, Cisapride, Clindamycin/lincomycin, Cortisporin-tc (Neomycin-colist-hc-thonzonium), Escitalopram, Hydrochlorothiazide, Hyoscyamine, Ibuprofen, Iodine, Medroxyprogesterone, Meloxicam, Minocycline, Moxifloxacin, Nitrofuran Derivatives, Ofloxacin, Pantoprazole, Penicillins, Pneumococcal Vaccines, Prednisolone, Prevacid (Lansoprazole), Propoxyphene, Soap, Spironolactone, Tobradex (Tobramycin-dexamethasone), Trolamine Salicylate, Verapamil, Voltaren (Diclofenac), Diphenhydramine, Hydrocodone, Tramadol           Current Medications (01/16/2023):  This is the current hospital active medication list Current Facility-Administered Medications  Medication Dose Route Frequency Provider Last Rate Last Admin   0.9 %  sodium chloride infusion   Intravenous Continuous Alekh, Kshitiz, MD       acetaminophen (TYLENOL) tablet 325-650 mg  325-650 mg Oral Q6H PRN Montez Morita, PA-C       acetaminophen (TYLENOL) tablet 650 mg  650 mg Oral Q6H Montez Morita, PA-C   650 mg at 01/16/23 0956   atorvastatin (LIPITOR) tablet 80 mg  80 mg Oral Daily Montez Morita, PA-C   80 mg at 01/16/23 0956   carvedilol (COREG) tablet 3.125 mg  3.125 mg Oral BID WC Montez Morita,  PA-C   3.125 mg at 01/16/23 0956    clopidogrel (PLAVIX) tablet 75 mg  75 mg Oral Daily Rexford Maus, RPH   75 mg at 01/16/23 0981   docusate sodium (COLACE) capsule 100 mg  100 mg Oral BID Montez Morita, PA-C   100 mg at 01/16/23 1914   ezetimibe (ZETIA) tablet 10 mg  10 mg Oral Daily Montez Morita, PA-C   10 mg at 01/16/23 0956   fentaNYL (SUBLIMAZE) injection 12.5 mcg  12.5 mcg Intravenous Q6H PRN Glade Lloyd, MD       levothyroxine (SYNTHROID) tablet 50 mcg  50 mcg Oral Q0600 Montez Morita, PA-C   50 mcg at 01/16/23 7829   LORazepam (ATIVAN) tablet 0.5 mg  0.5 mg Oral Daily PRN Montez Morita, PA-C   0.5 mg at 01/14/23 0441   menthol-cetylpyridinium (CEPACOL) lozenge 3 mg  1 lozenge Oral PRN Montez Morita, PA-C       Or   phenol (CHLORASEPTIC) mouth spray 1 spray  1 spray Mouth/Throat PRN Montez Morita, PA-C       methocarbamol (ROBAXIN) tablet 500 mg  500 mg Oral Q6H PRN Dorcas Carrow, MD   500 mg at 01/16/23 0955   metoCLOPramide (REGLAN) tablet 5-10 mg  5-10 mg Oral Q8H PRN Montez Morita, PA-C       Or   metoCLOPramide (REGLAN) injection 5-10 mg  5-10 mg Intravenous Q8H PRN Montez Morita, PA-C   10 mg at 01/14/23 2253   mirtazapine (REMERON) tablet 7.5 mg  7.5 mg Oral QHS Montez Morita, PA-C   7.5 mg at 01/15/23 2206   ondansetron (ZOFRAN) injection 4 mg  4 mg Intravenous Q8H PRN Montez Morita, PA-C   4 mg at 01/14/23 1900   ondansetron (ZOFRAN) tablet 4 mg  4 mg Oral Q6H PRN Montez Morita, PA-C       Or   ondansetron [facility]) injection 4 mg  4 mg Intravenous Q6H PRN Montez Morita, PA-C       oxyCODONE (Oxy IR/ROXICODONE) immediate release tablet 2.5-5 mg  2.5-5 mg Oral Q4H PRN Montez Morita, PA-C   5 mg at 01/16/23 5621   polyethylene glycol (MIRALAX / GLYCOLAX) packet 17 g  17 g Oral Daily PRN Montez Morita, PA-C       sertraline (ZOLOFT) tablet 25 mg  25 mg Oral Daily Montez Morita, PA-C   25 mg at 01/16/23 3086     Discharge Medications: Please see discharge summary for a list of discharge medications.  Relevant Imaging  Results:  Relevant Lab Results:   Additional Information SSN: [id number]  Lorri Frederick, LCSW

## 2023-01-16 NOTE — Progress Notes (Addendum)
Mobility Specialist Progress Note   01/16/23 1330  Mobility  Activity Transferred from chair to bed  Level of Assistance Maximum assist, patient does 25-49%  Assistive Device Stedy  Range of Motion/Exercises Active;All extremities  RLE Weight Bearing WBAT  Activity Response Tolerated fair   Patient received in recliner, grimacing in pain and requesting assistance back to bed. Required max A and verbal cues for hand placement on Stedy to stand. Transferred to bed via Stedy without incident and needed mod A to descend from stand>sit. Pain limited bed mobility requiring total assist from sit>supine. Also, limited by periodic episodes of intense nausea, usually onset when pain spiked. Was left in supine with all needs met, call bell in reach and family present.   [place] Yariel Ferraris, BS EXP Mobility Specialist Please contact via SecureChat or Rehab office at [phone]

## 2023-01-17 DIAGNOSIS — S72141A Displaced intertrochanteric fracture of right femur, initial encounter for closed fracture: Secondary | ICD-10-CM | POA: Diagnosis not present

## 2023-01-17 DIAGNOSIS — I10 Essential (primary) hypertension: Secondary | ICD-10-CM | POA: Diagnosis not present

## 2023-01-17 DIAGNOSIS — I5042 Chronic combined systolic (congestive) and diastolic (congestive) heart failure: Secondary | ICD-10-CM | POA: Diagnosis not present

## 2023-01-17 DIAGNOSIS — N1831 Chronic kidney disease, stage 3a: Secondary | ICD-10-CM | POA: Diagnosis not present

## 2023-01-17 LAB — URINALYSIS, ROUTINE W REFLEX MICROSCOPIC
Bilirubin Urine: NEGATIVE
Glucose, UA: NEGATIVE mg/dL
Hgb urine dipstick: NEGATIVE
Ketones, ur: NEGATIVE mg/dL
Leukocytes,Ua: NEGATIVE
Nitrite: NEGATIVE
Protein, ur: NEGATIVE mg/dL
Specific Gravity, Urine: 1.012 (ref 1.005–1.030)
pH: 5 (ref 5.0–8.0)

## 2023-01-17 LAB — BASIC METABOLIC PANEL
Anion gap: 7 (ref 5–15)
BUN: 53 mg/dL — ABNORMAL HIGH (ref 8–23)
CO2: 22 mmol/L (ref 22–32)
Calcium: 8.2 mg/dL — ABNORMAL LOW (ref 8.9–10.3)
Chloride: 107 mmol/L (ref 98–111)
Creatinine, Ser: 2.13 mg/dL — ABNORMAL HIGH (ref 0.44–1.00)
GFR, Estimated: 23 mL/min — ABNORMAL LOW (ref >60)
Glucose, Bld: 114 mg/dL — ABNORMAL HIGH (ref 70–99)
Potassium: 4.3 mmol/L (ref 3.5–5.1)
Sodium: 136 mmol/L (ref 135–145)

## 2023-01-17 LAB — CBC
HCT: 24.6 % — ABNORMAL LOW (ref 36.0–46.0)
Hemoglobin: 8.5 g/dL — ABNORMAL LOW (ref 12.0–15.0)
MCH: 29.9 pg (ref 26.0–34.0)
MCHC: 34.6 g/dL (ref 30.0–36.0)
MCV: 86.6 fL (ref 80.0–100.0)
Platelets: 165 10*3/uL (ref 150–400)
RBC: 2.84 MIL/uL — ABNORMAL LOW (ref 3.87–5.11)
RDW: 15.3 % (ref 11.5–15.5)
WBC: 9.2 10*3/uL (ref 4.0–10.5)
nRBC: 0 % (ref 0.0–0.2)

## 2023-01-17 LAB — MAGNESIUM: Magnesium: 2.1 mg/dL (ref 1.7–2.4)

## 2023-01-17 MED ORDER — CHLORHEXIDINE GLUCONATE CLOTH 2 % EX PADS
6.0000 | MEDICATED_PAD | Freq: Every day | CUTANEOUS | Status: DC
  Administered 2023-01-17 – 2023-01-18 (×2): 6 via TOPICAL

## 2023-01-17 MED ORDER — PHENAZOPYRIDINE HCL 100 MG PO TABS: 100.0000 mg | ORAL_TABLET | Freq: Three times a day (TID) | ORAL | Status: DC

## 2023-01-17 NOTE — Progress Notes (Signed)
Mobility Specialist Progress Note   01/17/23 1500  Mobility  Activity Moved into chair position in bed (LE exercise)  Level of Assistance Minimal assist, patient does 75% or more  Assistive Device None  Range of Motion/Exercises Active;All extremities  Activity Response Tolerated fair   Patient received in supine sleep and aroused with light physical stimuli. Attempted to inquire about delivery of donut cushion but daughter at the bedside sleep. No cushion apparent in room.   Agreeable to participate. Opted to bed level exercise since patients donut cushion not delivered yet. Completed LLE exercise with supervision and RLE P-ROM with min A. Limited participation secondary to pain. Was left in supine with all needs met, call bell in reach.   Tamara Bullock, BS EXP Mobility Specialist Please contact via SecureChat or Rehab office at [phone]

## 2023-01-17 NOTE — Progress Notes (Addendum)
Physical Therapy Treatment Patient Details Name: Tamara Bullock MRN: [id number] DOB: 1941/08/01 Today's Date: 01/17/2023   History of Present Illness 82 year old admitted for Rt hip fracture after a fall; s/p  intramedullary nailing  Rt hip 4/15. history of CKD stage IIIa, stress sleep apnea, hypertension, hyperlipidemia, hypothyroidism, recently stroke and improved mobility after a stroke    PT Comments    Focused on bed mobility and transfer training with sit<>stand performed x3 up to max assist with RW for support, fatigues easily. Dtr reports pressure ulcer on pt sacrum, does not want pt out of bed in recliner until a donut cushion is ordered and delivered to room; notified RN. Will continue to follow and progress as tolerated. Patient will continue to benefit from skilled physical therapy services to further improve independence with functional mobility.  Patient will benefit from continued inpatient follow up therapy, <3 hours/day  BP in supine prior to therapy 103/47 MAP 63; Seated EOB 125/57 MAP 76; HR in 70s.      Recommendations for follow up therapy are one component of a multi-disciplinary discharge planning process, led by the attending physician.  Recommendations may be updated based on patient status, additional functional criteria and insurance authorization.  Follow Up Recommendations  Can patient physically be transported by private vehicle: No    Assistance Recommended at Discharge Frequent or constant Supervision/Assistance  Patient can return home with the following Two people to help with walking and/or transfers;A lot of help with bathing/dressing/bathroom;Assistance with cooking/housework;Assist for transportation;Help with stairs or ramp for entrance;Direct supervision/assist for medications management;Direct supervision/assist for financial management   Equipment Recommendations  None recommended by PT    Recommendations for Other Services       Precautions  / Restrictions Precautions Precautions: Fall Restrictions Weight Bearing Restrictions: Yes RLE Weight Bearing: Weight bearing as tolerated     Mobility  Bed Mobility Overal bed mobility: Needs Assistance Bed Mobility: Supine to Sit     Supine to sit: Mod assist, HOB elevated     General bed mobility comments: Mod assist for trunk and RLE support to EOB, using bed pad to turn and scoot forward, able to pull through UEs to assist.    Transfers Overall transfer level: Needs assistance Equipment used: Rolling walker (2 wheels) Transfers: Sit to/from Stand Sit to Stand: Max assist, From elevated surface           General transfer comment: Max assist for boost to stand, good posture on 1st attempt, tolerating WB through both feet. 2nd and 3rd attempt with worsening posture, likely due to fatigue. Showing heavy posterior lean needing assist to remain upright. Episode of bowel incontinence, assisted with pericare while standing.    Ambulation/Gait               General Gait Details: deferred   Stairs             Wheelchair Mobility    Modified Rankin (Stroke Patients Only)       Balance Overall balance assessment: Needs assistance Sitting-balance support: No upper extremity supported, Feet supported Sitting balance-Leahy Scale: Fair     Standing balance support: Bilateral upper extremity supported, Reliant on assistive device for balance Standing balance-Leahy Scale: Zero                              Cognition Arousal/Alertness: Awake/alert Behavior During Therapy: WFL for tasks assessed/performed Overall Cognitive Status: Impaired/Different from baseline Area of Impairment:  Orientation, Following commands, Safety/judgement, Problem solving, Memory                 Orientation Level: Disoriented to, Place, Time, Situation   Memory: Decreased recall of precautions, Decreased short-term memory Following Commands: Follows one step  commands inconsistently, Follows one step commands with increased time Safety/Judgement: Decreased awareness of safety, Decreased awareness of deficits   Problem Solving: Slow processing, Decreased initiation, Difficulty sequencing, Requires verbal cues, Requires tactile cues General Comments: Expressive difficulties.        Exercises General Exercises - Lower Extremity Ankle Circles/Pumps: AROM, Both, 10 reps, Supine Quad Sets: Strengthening, Both, 5 reps, Supine    General Comments General comments (skin integrity, edema, etc.): Daughter refuses pt up to chair until a Donut Cushion is delievered and utilized. States pt has a pressure ulcer - I was unable to locate documentation of this in chart however notified RN. Her BP in supine was 103/47 MAP 63 HR in 70s. After sitting up BP improved to 125/57 MAP of 75 HR in 70s.      Pertinent Vitals/Pain Pain Assessment Pain Assessment: PAINAD Breathing: normal Negative Vocalization: occasional moan/groan, low speech, negative/disapproving quality Facial Expression: sad, frightened, frown Body Language: tense, distressed pacing, fidgeting Consolability: distracted or reassured by voice/touch PAINAD Score: 4 Pain Descriptors / Indicators: Guarding, Grimacing Pain Intervention(s): Monitored during session, Repositioned, Limited activity within patient's tolerance, Ice applied    Home Living                          Prior Function            PT Goals (current goals can now be found in the care plan section) Acute Rehab PT Goals Patient Stated Goal: none PT Goal Formulation: Patient unable to participate in goal setting Time For Goal Achievement: 01/29/23 Potential to Achieve Goals: Good Progress towards PT goals: Progressing toward goals    Frequency    Min 3X/week      PT Plan Current plan remains appropriate    Co-evaluation              AM-PAC PT "6 Clicks" Mobility   Outcome Measure  Help needed  turning from your back to your side while in a flat bed without using bedrails?: A Lot Help needed moving from lying on your back to sitting on the side of a flat bed without using bedrails?: A Lot Help needed moving to and from a bed to a chair (including a wheelchair)?: A Lot Help needed standing up from a chair using your arms (e.g., wheelchair or bedside chair)?: A Lot Help needed to walk in hospital room?: Total Help needed climbing 3-5 steps with a railing? : Total 6 Click Score: 10    End of Session Equipment Utilized During Treatment: Gait belt Activity Tolerance: Patient tolerated treatment well Patient left: with call bell/phone within reach;with SCD's reapplied;with family/visitor present;in bed;with bed alarm set Nurse Communication: Mobility status (Daughter requests donut cushion for pressure ulcer pt has?) PT Visit Diagnosis: Unsteadiness on feet (R26.81);Muscle weakness (generalized) (M62.81);History of falling (Z91.81);Difficulty in walking, not elsewhere classified (R26.2);Pain Pain - Right/Left: Right Pain - part of body: Hip     Time: 8119-1478 PT Time Calculation (min) (ACUTE ONLY): 40 min  Charges:  $Therapeutic Activity: 23-37 mins                     Kathlyn Sacramento, PT, DPT Physical Therapist Acute Rehabilitation  Services [facility]    Berton Mount 01/17/2023, 1:38 PM

## 2023-01-17 NOTE — TOC Progression Note (Signed)
Transition of Care [facility]) - Progression Note    Patient Details  Name: Tamara Bullock MRN: [id number] Date of Birth: 1941/03/31  Transition of Care [facility]) CM/SW Contact  Lorri Frederick, LCSW Phone Number: 01/17/2023, 2:39 PM  Clinical Narrative:   [facility] request for SNF submitted in Navi and approved: [id number], 5 days: 4/19-4/23.    Expected Discharge Plan: Skilled Nursing Facility Barriers to Discharge: Continued Medical Work up  Expected Discharge Plan and Services In-house Referral: Clinical Social Work   Post Acute Care Choice: Skilled Nursing Facility Living arrangements for the past 2 months: Independent Living Facility Administrator, arts)                                       Social Determinants of Health (SDOH) Interventions SDOH Screenings   Food Insecurity: No Food Insecurity (01/13/2023)  Housing: Low Risk  (01/13/2023)  Transportation Needs: No Transportation Needs (01/13/2023)  Utilities: Not At Risk (01/13/2023)  Depression (PHQ2-9): Low Risk  (05/17/2022)  Recent Concern: Depression (PHQ2-9) - High Risk (04/04/2022)  Tobacco Use: Low Risk  (01/16/2023)    Readmission Risk Interventions     No data to display

## 2023-01-17 NOTE — Progress Notes (Signed)
PROGRESS NOTE    Tamara Bullock  ZOX:[id number] DOB: [birth year] DOA: 01/13/2023 PCP: Vivien Presto, MD   Brief Narrative:  82 year old with history of CKD stage IIIa, sleep apnea, hypertension, hyperlipidemia, hypothyroidism, recent stroke with impaired mobility presented after a fall and right hip pain.  She was found to have right intertrochanteric fracture and multiple old vertebral fractures.  Orthopedics was consulted.  She underwent ORIF on 01/14/2023.  Subsequently PT recommended SNF placement.  She also had acute kidney injury for which she was started on IV fluids.  Assessment & Plan:   Right intertrochanteric closed traumatic fracture after mechanical fall -Status post right hip ORIF on 01/14/2023 by Dr. Carola Frost.  Wound care/pain management/DVT prophylaxis as per orthopedics recommendations. -Fall precautions.  PT recommending SNF placement.  TOC following  Acute blood loss anemia in a patient with chronic anemia -Possibly from fracture and perioperative loss.  Status post 2 units packed red cells transfusion on 01/15/2023.  Hemoglobin 8.5 this morning.  Monitor H&H.  Acute kidney injury in a patient with history of CKD stage IIIa -Baseline creatinine of 1.3.  Creatinine worsened to 3.21 on 01/16/2023.  Improving to 2.13 today.  Renal ultrasound showed no hydronephrosis.  Decrease normal saline to 75 cc an hour.  Entresto and diuretics on hold -Check UA and culture  Acute urinary retention -Patient has had 2 straight in and out cath yesterday.  Continues to be in lower abdominal pain.  Will place Foley catheter.  Add Flomax.  Old vertebral fractures -Prior hospitalist discussed with neurosurgery: No indication for surgery at this time  Leukocytosis -Resolved  Hyponatremia -Resolved  Hyperkalemia -Resolved  Essential hypertension Chronic diastolic heart failure Hyperlipidemia -Currently compensated.  Blood pressure mostly stable or intermittently on the lower side.   Continue Coreg.  Entresto and torsemide on hold.  Plavix has been resumed -Continue Lipitor and Zetia  Hypothyroidism -Continue levothyroxine  Anxiety/depression -Continue mirtazapine and sertraline  DVT prophylaxis: SCDs Code Status: Full Family Communication: Daughter at bedside Disposition Plan: Status is: Inpatient Remains inpatient appropriate because: Of severity of illness.  Need for SNF placement.  Possibly tomorrow if creatinine improves.  Consultants: Orthopedics  Procedures: As above  Antimicrobials: Perioperative   Subjective: Patient seen and examined at bedside.  Poor historian, slow to respond.  No agitation, fever, vomiting reported.  Patient had urinary retention yesterday requiring straight in and out cath twice as per nursing staff.  Daughter at bedside states that patient had very bad night because of intermittent lower abdominal pain. Objective: Vitals:   01/16/23 0504 01/16/23 1358 01/16/23 1942 01/17/23 0500  BP: 101/69 (!) 109/41 (!) 131/53 (!) 126/59  Pulse:  62 74 71  Resp: Temp: 97.8 F (36.6 C)  98.3 F (36.8 C) 97.6 F (36.4 C)  TempSrc: Oral  Oral Oral  SpO2: 97% 99% 100% 95%  Weight:      Height:        Intake/Output Summary (Last 24 hours) at 01/17/2023 0738 Last data filed at 01/17/2023 0128 Gross per 24 hour  Intake 399.37 ml  Output 1150 ml  Net -750.63 ml    Filed Weights   01/13/23 1657  Weight: 54.9 kg    Examination:  General: Currently on room air.  No distress.  Chronically ill and deconditioned looking. ENT/neck: No thyromegaly.  JVD is not elevated  respiratory: Decreased breath sounds at bases bilaterally with some crackles; no wheezing  CVS: S1-S2 heard, rate controlled currently Abdominal: Soft,  nontender, slightly distended; no organomegaly, bowel sounds are heard Extremities: Mild bilateral lower extremity edema; no cyanosis  CNS: Awake and alert.  Extremely slow to respond.  Poor historian.  No  focal neurologic deficit.  Moves extremities Lymph: No obvious lymphadenopathy Skin: No obvious ecchymosis/lesions  psych: Very flat affect.  Currently not agitated.  Intermittently moaning.   Data Reviewed: I have personally reviewed following labs and imaging studies  CBC: Recent Labs  Lab 01/13/23 1340 01/13/23 1435 01/14/23 0206 01/15/23 0206 01/16/23 0119 01/17/23 0250  WBC 10.1  --  8.7 11.7* 11.8* 9.2  HGB 10.8* 11.6* 9.5* 7.0* 9.3* 8.5*  HCT 31.3* 34.0* 28.2* 21.1* 27.4* 24.6*  MCV 89.4  --  89.5 93.4 87.8 86.6  PLT 247  --  196 175 149* 165    Basic Metabolic Panel: Recent Labs  Lab 01/13/23 1340 01/13/23 1435 01/14/23 0206 01/15/23 0206 01/16/23 0119 01/17/23 0250  NA 139 138 137 136 133* 136  K 3.6 3.4* 3.1* 4.0 5.3* 4.3  CL 97* 103 101 103 106 107  CO2 24  --  25 22 19* 22  GLUCOSE 137* 126* 137* 166* 114* 114*  BUN 30* 31* 31* 37* 59* 53*  CREATININE 1.43* 1.30* 1.77* 2.19* 3.21* 2.13*  CALCIUM 9.2  --  8.8* 8.5* 8.4* 8.2*  MG  --   --   --   --   --  2.1    GFR: Estimated Creatinine Clearance: 16.6 mL/min (A) (by C-G formula based on SCr of 2.13 mg/dL (H)). Liver Function Tests: Recent Labs  Lab 01/13/23 1340  AST 33  ALT 22  ALKPHOS 63  BILITOT 1.0  PROT 6.8  ALBUMIN 4.1    No results for input(s): "LIPASE", "AMYLASE" in the last 168 hours. No results for input(s): "AMMONIA" in the last 168 hours. Coagulation Profile: Recent Labs  Lab 01/13/23 1912  INR 1.2    Cardiac Enzymes: No results for input(s): "CKTOTAL", "CKMB", "CKMBINDEX", "TROPONINI" in the last 168 hours. BNP (last 3 results) Recent Labs    04/09/22 1654  PROBNP 1,208*    HbA1C: No results for input(s): "HGBA1C" in the last 72 hours. CBG: No results for input(s): "GLUCAP" in the last 168 hours. Lipid Profile: No results for input(s): "CHOL", "HDL", "LDLCALC", "TRIG", "CHOLHDL", "LDLDIRECT" in the last 72 hours. Thyroid Function Tests: No results for  input(s): "TSH", "T4TOTAL", "FREET4", "T3FREE", "THYROIDAB" in the last 72 hours. Anemia Panel: No results for input(s): "VITAMINB12", "FOLATE", "FERRITIN", "TIBC", "IRON", "RETICCTPCT" in the last 72 hours. Sepsis Labs: Recent Labs  Lab 01/13/23 1340  LATICACIDVEN 2.4*     No results found for this or any previous visit (from the past 240 hour(s)).       Radiology Studies: US RENAL  Result Date: 01/16/2023 CLINICAL DATA:  [id number], with acute kidney injury. EXAM: RENAL / URINARY TRACT ULTRASOUND COMPLETE COMPARISON:  CT with IV contrast 01/13/2023 FINDINGS: Right Kidney: Renal measurements: 10.0 x 3.9 x 3.9 cm = volume: 79.4 mL. Echogenicity within normal limits. No mass, stones or hydronephrosis visualized. There was a 1.3 cm benign-appearing cyst in the inferior pole of this kidney on CT, but it was not visualized on this ultrasound. Left Kidney: Renal measurements: 9.5 x 5.0 x 3.8 cm = volume: 95.1 mL. Echogenicity within normal limits. No mass, stones or hydronephrosis visualized. The CT did, however demonstrate two separate 1 mm caliceal stones in the superior pole of this kidney. Bladder: Appears normal for degree of bladder distention. Other:  None. IMPRESSION: 1. No increased renal cortical echogenicity or hydronephrosis. 2. Two 1 mm nonobstructive stones in the upper pole left kidney and a 1.3 cm cyst in the right inferior pole on CT, are not redemonstrated by ultrasound. Electronically Signed   By: Almira Bar M.D.   On: 01/16/2023 20:42        Scheduled Meds:  acetaminophen  650 mg Oral Q6H   atorvastatin  80 mg Oral Daily   carvedilol  3.125 mg Oral BID WC   clopidogrel  75 mg Oral Daily   docusate sodium  100 mg Oral BID   ezetimibe  10 mg Oral Daily   levothyroxine  50 mcg Oral Q0600   mirtazapine  7.5 mg Oral QHS   sertraline  25 mg Oral Daily   Continuous Infusions:  sodium chloride 100 mL/hr at 01/17/23 0057          Glade Lloyd, MD [facility] 01/17/2023, 7:38 AM

## 2023-01-18 DIAGNOSIS — S72141A Displaced intertrochanteric fracture of right femur, initial encounter for closed fracture: Secondary | ICD-10-CM | POA: Diagnosis not present

## 2023-01-18 DIAGNOSIS — I5042 Chronic combined systolic (congestive) and diastolic (congestive) heart failure: Secondary | ICD-10-CM | POA: Diagnosis not present

## 2023-01-18 LAB — CBC WITH DIFFERENTIAL/PLATELET
Abs Immature Granulocytes: 0.03 10*3/uL (ref 0.00–0.07)
Basophils Absolute: 0 10*3/uL (ref 0.0–0.1)
Basophils Relative: 1 %
Eosinophils Absolute: 0.4 10*3/uL (ref 0.0–0.5)
Eosinophils Relative: 5 %
HCT: 20.8 % — ABNORMAL LOW (ref 36.0–46.0)
Hemoglobin: 7.3 g/dL — ABNORMAL LOW (ref 12.0–15.0)
Immature Granulocytes: 0 %
Lymphocytes Relative: 17 %
Lymphs Abs: 1.2 10*3/uL (ref 0.7–4.0)
MCH: 30.7 pg (ref 26.0–34.0)
MCHC: 35.1 g/dL (ref 30.0–36.0)
MCV: 87.4 fL (ref 80.0–100.0)
Monocytes Absolute: 0.9 10*3/uL (ref 0.1–1.0)
Monocytes Relative: 12 %
Neutro Abs: 4.7 10*3/uL (ref 1.7–7.7)
Neutrophils Relative %: 65 %
Platelets: 152 10*3/uL (ref 150–400)
RBC: 2.38 MIL/uL — ABNORMAL LOW (ref 3.87–5.11)
RDW: 14.8 % (ref 11.5–15.5)
WBC: 7.3 10*3/uL (ref 4.0–10.5)
nRBC: 0 % (ref 0.0–0.2)

## 2023-01-18 LAB — COMPREHENSIVE METABOLIC PANEL
ALT: 83 U/L — ABNORMAL HIGH (ref 0–44)
AST: 91 U/L — ABNORMAL HIGH (ref 15–41)
Albumin: 2.3 g/dL — ABNORMAL LOW (ref 3.5–5.0)
Alkaline Phosphatase: 41 U/L (ref 38–126)
Anion gap: 8 (ref 5–15)
BUN: 46 mg/dL — ABNORMAL HIGH (ref 8–23)
CO2: 19 mmol/L — ABNORMAL LOW (ref 22–32)
Calcium: 7.9 mg/dL — ABNORMAL LOW (ref 8.9–10.3)
Chloride: 109 mmol/L (ref 98–111)
Creatinine, Ser: 1.48 mg/dL — ABNORMAL HIGH (ref 0.44–1.00)
GFR, Estimated: 35 mL/min — ABNORMAL LOW (ref >60)
Glucose, Bld: 107 mg/dL — ABNORMAL HIGH (ref 70–99)
Potassium: 3.9 mmol/L (ref 3.5–5.1)
Sodium: 136 mmol/L (ref 135–145)
Total Bilirubin: 0.6 mg/dL (ref 0.3–1.2)
Total Protein: 4.3 g/dL — ABNORMAL LOW (ref 6.5–8.1)

## 2023-01-18 LAB — MAGNESIUM: Magnesium: 1.9 mg/dL (ref 1.7–2.4)

## 2023-01-18 LAB — URINE CULTURE: Culture: NO GROWTH

## 2023-01-18 MED ORDER — METHOCARBAMOL 500 MG PO TABS: 500.0000 mg | ORAL_TABLET | Freq: Four times a day (QID) | ORAL | 0 refills | Status: DC | PRN

## 2023-01-18 MED ORDER — ACETAMINOPHEN 325 MG PO TABS: 650.0000 mg | ORAL_TABLET | Freq: Four times a day (QID) | ORAL | 0 refills | Status: AC | PRN

## 2023-01-18 MED ORDER — DOCUSATE SODIUM 100 MG PO CAPS: 100.0000 mg | ORAL_CAPSULE | Freq: Two times a day (BID) | ORAL | 0 refills | Status: AC

## 2023-01-18 MED ORDER — POLYETHYLENE GLYCOL 3350 17 G PO PACK: 17.0000 g | PACK | Freq: Every day | ORAL | 0 refills | Status: AC | PRN

## 2023-01-18 MED ORDER — LORAZEPAM 0.5 MG PO TABS: 0.5000 mg | ORAL_TABLET | Freq: Every day | ORAL | 0 refills | Status: DC | PRN

## 2023-01-18 MED ORDER — OXYCODONE HCL 5 MG PO TABS: 2.5000 mg | ORAL_TABLET | ORAL | 0 refills | Status: AC | PRN

## 2023-01-18 NOTE — Discharge Summary (Addendum)
Physician Discharge Summary  Tamara Bullock ZOX:[id number] DOB: May 08, 1941 DOA: 01/13/2023  PCP: Vivien Presto, MD  Admit date: 01/13/2023 Discharge date: 01/18/2023  Admitted From: [facility] Disposition: SNF  Recommendations for Outpatient Follow-up:  Follow up with SNF provider at earliest convenience Outpatient follow-up with orthopedics.  Discharge wound care/pain management/DVT prophylaxis as per orthopedics recommendations Recommend outpatient evaluation and follow-up by urology  follow up in ED if symptoms worsen or new appear   Home Health: No Equipment/Devices: None  Discharge Condition: Guarded CODE STATUS: Full Diet recommendation: Heart healthy/fluid restriction of up to 1200 cc a day  Brief/Interim Summary: 82 year old with history of CKD stage IIIa, sleep apnea, hypertension, hyperlipidemia, hypothyroidism, recent stroke with impaired mobility presented after a fall and right hip pain.  She was found to have right intertrochanteric fracture and multiple old vertebral fractures.  Orthopedics was consulted.  She underwent ORIF on 01/14/2023.  Subsequently PT recommended SNF placement.  She also had acute kidney injury for which she was started on IV fluids.  She also had issues with acute urinary retention subsequently requiring Foley catheter placement on 01/17/2023.  After that, her condition has improved.  Orthopedics has cleared her for discharge.  She will be discharged to SNF today if bed is available.  Outpatient follow-up with orthopedics and urology and SNF provider.  Discharge Diagnoses:   Right intertrochanteric closed traumatic fracture after mechanical fall with osteoporosis possibly contributing -Status post right hip ORIF on 01/14/2023 by Dr. Carola Frost.  Wound care/pain management/DVT prophylaxis as per orthopedics recommendations.  Orthopedics is okay for Plavix to be used as DVT prophylaxis. -Fall precautions.  PT recommending SNF placement.  TOC following -  She will  be discharged to SNF today if bed is available.  Outpatient follow-up with orthopedics   Acute blood loss anemia in a patient with chronic anemia -Possibly from fracture and perioperative loss.  Status post 2 units packed red cells transfusion on 01/15/2023.  Hemoglobin 7.3 this morning.  Outpatient follow-up of H&H.  Acute kidney injury in a patient with history of CKD stage IIIa -Baseline creatinine of 1.3.  Creatinine worsened to 3.21 on 01/16/2023.  Improving to 1.48 today, almost close to baseline.  Renal ultrasound showed no hydronephrosis.  DC IV fluids.  Entresto and diuretics on hold and will continue to remain on hold on discharge.  Outpatient follow-up of BMP. -UA was unremarkable   acute urinary retention -Foley catheter placed on 01/17/2023 after few straight in and out caths.  Lower abdominal pain has improved afterwards.  Blood pressure intermittently on the lower side, hence did not start Flomax.  Will need outpatient voiding trial and possible urology follow-up.    Old vertebral fractures -Prior hospitalist discussed with neurosurgery: No indication for surgery at this time   Leukocytosis -Resolved   Hyponatremia -Resolved   Hyperkalemia -Resolved   Essential hypertension Chronic diastolic heart failure Hyperlipidemia -Currently compensated.  Blood pressure mostly stable or intermittently on the lower side.  Continue Coreg.  Entresto and torsemide remain on hold because of intermittent hypotension.  These can be addressed as an outpatient.  Outpatient follow-up with cardiology.  Plavix has been resumed -Continue Lipitor and Zetia   Hypothyroidism -Continue levothyroxine   Anxiety/depression -Continue mirtazapine and sertraline    Discharge Instructions  Discharge Instructions     Diet - low sodium heart healthy   Complete by: As directed    Increase activity slowly   Complete by: As directed       Allergies  as of 01/18/2023       Reactions    Diphenhydramine Hcl Shortness Of Breath   Dermatologicals-iv administration was painful until diluted; can take liquid in small doses by mouth.   Moxifloxacin Hcl In Nacl    Other reaction(s): Other (see comments) Fluoroquinolones-short term stomach and chest pain   Penicillin G Hives   Cephalosporin tolerant. Received ancef 2019 during back surgery    Sulfa Antibiotics Hives   Colistin    Other reaction(s): Other (see comments)   Diclofenac Sodium    Other reaction(s): Other Analgesics-Anti-inflammatory   Esomeprazole    Other reaction(s): Other (see comments)   Estradiol    Other reaction(s): Other (see comments)   Medroxyprogesterone Acetate    Other reaction(s): Other (see comments) Contraceptives   Neomycin-polymyxin-hc    Other reaction(s): Other Eye drops   Nitrofurantoin    Other reaction(s): Other Anti-infectives   Nsaids    Other reaction(s): Other (see comments) Can't tolerate OTC anit-inflammatories-can tolerate advil for a short periods   Prednisone    Other reaction(s): Other (see comments) Eye drop   Proparacaine    Other reaction(s): Other (see comments) Used at eye doctor   Rabeprazole    Other reaction(s): Other (see comments) Ulcer drugs-stomach/chest pain, pasty yellow stools   Amitriptyline    Antidepressants   Aspirin    unknown   Azithromycin    unknown   Carbocaine [mepivacaine]    unknown   Cimetidine    unknown   Ciprofloxacin    unknown   Cisapride    unknown   Clindamycin/lincomycin    unknown   Cortisporin-tc [neomycin-colist-hc-thonzonium]    unknown   Escitalopram    unknown   Hydrochlorothiazide    unknown   Hyoscyamine    Ulcer drugs   Ibuprofen    unknown   Iodine    unknown   Medroxyprogesterone    unknown   Meloxicam    Minocycline    Tetracyclines   Moxifloxacin    Chest pain    Nitrofuran Derivatives    unknown   Ofloxacin    unknown   Pantoprazole    unknown   Penicillins    unknown   Pneumococcal  Vaccines    Prednisolone    unknown   Prevacid [lansoprazole]    Propoxyphene    -N 100 -Analgesics-opiod   Soap    unknown   Spironolactone    unknown   Tobradex [tobramycin-dexamethasone]    unknown   Trolamine Salicylate    unknown   Verapamil    unknown   Voltaren [diclofenac]    unknown   Diphenhydramine    Other reaction(s): Other (see comments)   Hydrocodone    Other reaction(s): Other (see comments)   Tramadol    Other reaction(s): Other (see comments)        Medication List     STOP taking these medications    Entresto 24-26 MG Generic drug: sacubitril-valsartan   potassium chloride 10 MEQ tablet Commonly known as: KLOR-CON   torsemide 20 MG tablet Commonly known as: DEMADEX       TAKE these medications    acetaminophen 325 MG tablet Commonly known as: TYLENOL Take 2 tablets (650 mg total) by mouth every 6 (six) hours as needed. What changed:  how much to take when to take this reasons to take this   atorvastatin 80 MG tablet Commonly known as: LIPITOR Take 1 tablet (80 mg total) by mouth daily. What changed: when to  take this   carvedilol 3.125 MG tablet Commonly known as: COREG Take 1 tablet (3.125 mg total) by mouth 2 (two) times daily with a meal.   clopidogrel 75 MG tablet Commonly known as: PLAVIX Take 1 tablet (75 mg total) by mouth daily.   docusate sodium 100 MG capsule Commonly known as: COLACE Take 1 capsule (100 mg total) by mouth 2 (two) times daily.   Euthyrox 50 MCG tablet Generic drug: levothyroxine Take 50 mcg by mouth daily.   ezetimibe 10 MG tablet Commonly known as: Zetia Take 1 tablet (10 mg total) by mouth daily.   LORazepam 0.5 MG tablet Commonly known as: ATIVAN Take 1 tablet (0.5 mg total) by mouth daily as needed for anxiety. What changed:  when to take this reasons to take this   methocarbamol 500 MG tablet Commonly known as: ROBAXIN Take 1 tablet (500 mg total) by mouth every 6 (six) hours as  needed for muscle spasms.   mirtazapine 7.5 MG tablet Commonly known as: REMERON Take 1 tablet (7.5 mg total) by mouth at bedtime.   oxyCODONE 5 MG immediate release tablet Commonly known as: Oxy IR/ROXICODONE Take 0.5-1 tablets (2.5-5 mg total) by mouth every 4 (four) hours as needed for moderate pain or severe pain.   polyethylene glycol 17 g packet Commonly known as: MIRALAX / GLYCOLAX Take 17 g by mouth daily as needed for mild constipation.   sertraline 50 MG tablet Commonly known as: ZOLOFT Take 50 mg by mouth daily. What changed: Another medication with the same name was removed. Continue taking this medication, and follow the directions you see here.        Follow-up Information     Myrene Galas, MD Follow up in 10 day(s).   Specialty: Orthopedic Surgery Contact information: [address] [place] [address] [phone]                Allergies  Allergen Reactions   Diphenhydramine Hcl Shortness Of Breath    Dermatologicals-iv administration was painful until diluted; can take liquid in small doses by mouth.   Moxifloxacin Hcl In Nacl     Other reaction(s): Other (see comments) Fluoroquinolones-short term stomach and chest pain   Penicillin G Hives    Cephalosporin tolerant. Received ancef 2019 during back surgery    Sulfa Antibiotics Hives   Colistin     Other reaction(s): Other (see comments)   Diclofenac Sodium     Other reaction(s): Other Analgesics-Anti-inflammatory   Esomeprazole     Other reaction(s): Other (see comments)   Estradiol     Other reaction(s): Other (see comments)   Medroxyprogesterone Acetate     Other reaction(s): Other (see comments) Contraceptives   Neomycin-Polymyxin-Hc     Other reaction(s): Other Eye drops   Nitrofurantoin     Other reaction(s): Other Anti-infectives   Nsaids     Other reaction(s): Other (see comments) Can't tolerate OTC anit-inflammatories-can tolerate advil for a short periods    Prednisone     Other reaction(s): Other (see comments) Eye drop   Proparacaine     Other reaction(s): Other (see comments) Used at eye doctor   Rabeprazole     Other reaction(s): Other (see comments) Ulcer drugs-stomach/chest pain, pasty yellow stools   Amitriptyline     Antidepressants   Aspirin     unknown   Azithromycin     unknown   Carbocaine [Mepivacaine]     unknown   Cimetidine     unknown  Ciprofloxacin     unknown   Cisapride     unknown   Clindamycin/Lincomycin     unknown   Cortisporin-Tc [Neomycin-Colist-Hc-Thonzonium]     unknown   Escitalopram     unknown   Hydrochlorothiazide     unknown   Hyoscyamine     Ulcer drugs   Ibuprofen     unknown   Iodine     unknown   Medroxyprogesterone     unknown   Meloxicam    Minocycline     Tetracyclines   Moxifloxacin     Chest pain    Nitrofuran Derivatives     unknown   Ofloxacin     unknown   Pantoprazole     unknown   Penicillins     unknown   Pneumococcal Vaccines    Prednisolone     unknown   Prevacid [Lansoprazole]    Propoxyphene     -N 100 -Analgesics-opiod   Soap     unknown   Spironolactone     unknown   Tobradex [Tobramycin-Dexamethasone]     unknown   Trolamine Salicylate     unknown   Verapamil     unknown   Voltaren [Diclofenac]     unknown   Diphenhydramine     Other reaction(s): Other (see comments)   Hydrocodone     Other reaction(s): Other (see comments)   Tramadol     Other reaction(s): Other (see comments)    Consultations: Orthopedics   Procedures/Studies: US RENAL  Result Date: 01/16/2023 CLINICAL DATA:  [id number], with acute kidney injury. EXAM: RENAL / URINARY TRACT ULTRASOUND COMPLETE COMPARISON:  CT with IV contrast 01/13/2023 FINDINGS: Right Kidney: Renal measurements: 10.0 x 3.9 x 3.9 cm = volume: 79.4 mL. Echogenicity within normal limits. No mass, stones or hydronephrosis visualized. There was a 1.3 cm benign-appearing cyst in the inferior pole of this  kidney on CT, but it was not visualized on this ultrasound. Left Kidney: Renal measurements: 9.5 x 5.0 x 3.8 cm = volume: 95.1 mL. Echogenicity within normal limits. No mass, stones or hydronephrosis visualized. The CT did, however demonstrate two separate 1 mm caliceal stones in the superior pole of this kidney. Bladder: Appears normal for degree of bladder distention. Other: None. IMPRESSION: 1. No increased renal cortical echogenicity or hydronephrosis. 2. Two 1 mm nonobstructive stones in the upper pole left kidney and a 1.3 cm cyst in the right inferior pole on CT, are not redemonstrated by ultrasound. Electronically Signed   By: Almira Bar M.D.   On: 01/16/2023 20:42   DG FEMUR PORT, MIN 2 VIEWS RIGHT  Result Date: 01/14/2023 CLINICAL DATA:  Right hip fracture EXAM: RIGHT FEMUR PORTABLE 2 VIEW COMPARISON:  01/13/2023 FINDINGS: Interval ORIF of a comminuted intertrochanteric fracture of the right femur. Long intramedullary rod with distal interlocking screws and proximal lag screw. Improved fracture alignment lesser trochanteric fragment is medially displaced. No new fractures. Hip and knee joint alignment is maintained. Osteoarthritis of the knee. Expected postoperative changes within the soft tissues. IMPRESSION: Interval ORIF of right femur fracture with improved fracture alignment. Electronically Signed   By: Duanne Guess D.O.   On: 01/14/2023 20:51   Pelvis Portable  Result Date: 01/14/2023 CLINICAL DATA:  Postop EXAM: PORTABLE PELVIS 1-2 VIEWS COMPARISON:  Preoperative radiograph FINDINGS: Long intramedullary nail with trans trochanteric and distal locking screw fixation traverse comminuted proximal femur fracture. Improved fracture alignment from preoperative imaging. Recent postsurgical change includes air and edema in the soft  tissues. IMPRESSION: ORIF of comminuted proximal femur fracture. No immediate postoperative complication. Electronically Signed   By: Narda Rutherford M.D.   On:  01/14/2023 18:26   DG FEMUR, MIN 2 VIEWS RIGHT  Result Date: 01/14/2023 CLINICAL DATA:  ORIF EXAM: Intraoperative fluoroscopy COMPARISON:  Preop x-ray 05/15/2023 FINDINGS: Six fluoroscopic spot images submitted for review demonstrate placement of dynamic hip screw with long intramedullary rod. Distal fixation screws. This transfixes the comminuted intertrochanteric hip fracture. Expected alignment. Imaging was obtained to aid in treatment. Please correlate with real-time fluoroscopy of 1 minute and 42 seconds. Cumulative dose 15.75 mGy IMPRESSION: Intraoperative fluoroscopy Electronically Signed   By: Karen Kays M.D.   On: 01/14/2023 16:55   DG C-Arm 1-60 Min-No Report  Result Date: 01/14/2023 Fluoroscopy was utilized by the requesting physician.  No radiographic interpretation.   DG C-Arm 1-60 Min-No Report  Result Date: 01/14/2023 Fluoroscopy was utilized by the requesting physician.  No radiographic interpretation.   CT CHEST ABDOMEN PELVIS W CONTRAST  Result Date: 01/13/2023 CLINICAL DATA:  Polytrauma, blunt EXAM: CT CHEST, ABDOMEN, AND PELVIS WITH CONTRAST TECHNIQUE: Multidetector CT imaging of the chest, abdomen and pelvis was performed following the standard protocol during bolus administration of intravenous contrast. RADIATION DOSE REDUCTION: This exam was performed according to the departmental dose-optimization program which includes automated exposure control, adjustment of the mA and/or kV according to patient size and/or use of iterative reconstruction technique. CONTRAST:  75mL OMNIPAQUE IOHEXOL 350 MG/ML SOLN COMPARISON:  Same day radiographs FINDINGS: CT CHEST FINDINGS Cardiovascular: No significant vascular findings. Thoracic aorta is nonaneurysmal. Atherosclerotic calcifications of the aorta and coronary arteries. Calcified aortic valve. Central pulmonary vasculature is nondilated. Normal heart size. No pericardial effusion. Mediastinum/Nodes: No enlarged mediastinal, hilar, or  axillary lymph nodes. Thyroid gland, trachea, and esophagus demonstrate no significant findings. Lungs/Pleura: Bandlike scarring or atelectasis within the right middle lobe and to a lesser degree within the lingula. No focal airspace consolidation. No pleural effusion or pneumothorax. Musculoskeletal: Chronic-appearing severe wedge compression fracture of the T12 vertebral body. Remaining thoracic vertebral body heights are maintained. No chest wall hematoma. CT ABDOMEN PELVIS FINDINGS Hepatobiliary: No focal liver abnormality is seen. No gallstones, gallbladder wall thickening, or biliary dilatation. Pancreas: Unremarkable. No pancreatic ductal dilatation or surrounding inflammatory changes. Spleen: Normal in size without focal abnormality. Adrenals/Urinary Tract: Unremarkable adrenal glands. Punctate 2 mm stone within the upper pole of the left kidney. No right-sided renal calculi. No solid renal lesion. Mild-to-moderate bilateral hydronephrosis. Moderately distended urinary bladder. 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 Remainder of the stomach appears within normal limits. No dilated loops of bowel. No focal bowel wall thickening or inflammatory changes. Vascular/Lymphatic: Aortic atherosclerosis. No enlarged abdominal or pelvic lymph nodes. Reproductive: Status post hysterectomy. No adnexal masses. Other: No free fluid. No abdominopelvic fluid collection. No pneumoperitoneum. No abdominal wall hernia. Musculoskeletal: Acute comminuted intertrochanteric fracture of the right femur with varus angulation. Soft tissue fullness at the fracture site. No well-defined hematoma. Lumbar vertebral body heights are maintained. Pelvic bony ring intact without fracture or diastasis. IMPRESSION: 1. Acute comminuted intertrochanteric fracture of the right femur with varus angulation. 2. Chronic-appearing severe wedge compression fracture of  the T12 vertebral body. An acute on chronic component is not excluded. Correlate with point tenderness. 3. Mild-to-moderate bilateral hydronephrosis with moderate distention of the urinary bladder. Correlate for urinary retention or outlet obstruction. 4. Punctate left nephrolithiasis. 5. 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 6. Aortic atherosclerosis (ICD10-I70.0). Electronically Signed   By: Duanne Guess D.O.   On: 01/13/2023 13:46   CT HEAD WO CONTRAST  Result Date: 01/13/2023 CLINICAL DATA:  Head trauma, moderate-severe; Polytrauma, blunt EXAM: CT HEAD WITHOUT CONTRAST CT CERVICAL SPINE WITHOUT CONTRAST TECHNIQUE: Multidetector CT imaging of the head and cervical spine was performed following the standard protocol without intravenous contrast. Multiplanar CT image reconstructions of the cervical spine were also generated. RADIATION DOSE REDUCTION: This exam was performed according to the departmental dose-optimization program which includes automated exposure control, adjustment of the mA and/or kV according to patient size and/or use of iterative reconstruction technique. COMPARISON:  CT head March 02, 2022. 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 No evidence of acute large vascular territory infarct, acute hemorrhage, mass lesion, or midline shift. Vascular: Calcific atherosclerosis. Skull: No acute fracture. Sinuses/Orbits: Clear sinuses.  No acute orbital findings. Other: No mastoid effusions. CT CERVICAL SPINE FINDINGS Alignment: Mild anterolisthesis of C5 on C6, likely degenerative given facet arthropathy at this level. Otherwise, no substantial sagittal subluxation. Skull base and vertebrae: No evidence of acute fracture. Vertebral body heights are maintained. Osteopenia. Soft tissues and spinal canal: No prevertebral fluid or swelling. No visible canal hematoma. Disc levels: Multilevel facet and  uncovertebral hypertrophy with varying degrees of neural foraminal stenosis. 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 Osteopenia. Upper chest: Biapical pleuroparenchymal scarring.  No consolidation. IMPRESSION: No evidence of acute abnormality intracranially or in the cervical spine. Electronically Signed   By: Feliberto Harts M.D.   On: 01/13/2023 13:35   CT CERVICAL SPINE WO CONTRAST  Result Date: 01/13/2023 CLINICAL DATA:  Head trauma, moderate-severe; Polytrauma, blunt EXAM: CT HEAD WITHOUT CONTRAST CT CERVICAL SPINE WITHOUT CONTRAST TECHNIQUE: Multidetector CT imaging of the head and cervical spine was performed following the standard protocol without intravenous contrast. Multiplanar CT image reconstructions of the cervical spine were also generated. RADIATION DOSE REDUCTION: This exam was performed according to the departmental dose-optimization program which includes automated exposure control, adjustment of the mA and/or kV according to patient size and/or use of iterative reconstruction technique. COMPARISON:  CT head March 02, 2022. 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 No evidence of acute large vascular territory infarct, acute hemorrhage, mass lesion, or midline shift. Vascular: Calcific atherosclerosis. Skull: No acute fracture. Sinuses/Orbits: Clear sinuses.  No acute orbital findings. Other: No mastoid effusions. CT CERVICAL SPINE FINDINGS Alignment: Mild anterolisthesis of C5 on C6, likely degenerative given facet arthropathy at this level. Otherwise, no substantial sagittal subluxation. Skull base and vertebrae: No evidence of acute fracture. Vertebral body heights are maintained. Osteopenia. Soft tissues and spinal canal: No prevertebral fluid or swelling. No visible canal hematoma. Disc levels: Multilevel facet and uncovertebral hypertrophy with varying degrees of neural foraminal stenosis. 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  Osteopenia. Upper chest: Biapical pleuroparenchymal scarring.  No consolidation. IMPRESSION: No evidence of acute abnormality intracranially or in the cervical spine. Electronically Signed   By: Feliberto Harts M.D.   On: 01/13/2023 13:35   DG Pelvis Portable  Result Date: 01/13/2023 CLINICAL DATA:  Right leg pain after fall. EXAM: PORTABLE PELVIS 1-2 VIEWS; RIGHT FEMUR 2 VIEWS COMPARISON:  None Available. FINDINGS: Acute comminuted right intertrochanteric femur fracture with varus angulation. No additional fracture. No dislocation. Mild degenerative changes of both hip joints. Moderate right knee medial compartment osteoarthritis. Diffuse osteopenia. Soft tissues  are unremarkable. IMPRESSION: 1. Acute comminuted right intertrochanteric femur fracture. Electronically Signed   By: Obie Dredge M.D.   On: 01/13/2023 12:54   DG FEMUR, MIN 2 VIEWS RIGHT  Result Date: 01/13/2023 CLINICAL DATA:  Right leg pain after fall. EXAM: PORTABLE PELVIS 1-2 VIEWS; RIGHT FEMUR 2 VIEWS COMPARISON:  None Available. FINDINGS: Acute comminuted right intertrochanteric femur fracture with varus angulation. No additional fracture. No dislocation. Mild degenerative changes of both hip joints. Moderate right knee medial compartment osteoarthritis. Diffuse osteopenia. Soft tissues are unremarkable. IMPRESSION: 1. Acute comminuted right intertrochanteric femur fracture. Electronically Signed   By: Obie Dredge M.D.   On: 01/13/2023 12:54   DG Chest Port 1 View  Result Date: 01/13/2023 CLINICAL DATA:  Trauma EXAM: PORTABLE CHEST 1 VIEW COMPARISON:  None Available. FINDINGS: The heart size and mediastinal contours are within normal limits. Aortic atherosclerosis. Both lungs are clear. The visualized skeletal structures are unremarkable. IMPRESSION: No active disease. Electronically Signed   By: Duanne Guess D.O.   On: 01/13/2023 12:53      Subjective: Patient seen and examined at bedside.  Slow to respond, poor  historian.  Daughter present at bedside states that she slept better and is feeling better.  Okay for the patient to be discharged to SNF today.  No fever, vomiting, seizures reported.  Oral intake remains poor.  Discharge Exam: Vitals:   01/18/23 0844 01/18/23 0849  BP: (!) 129/46   Pulse: (!) 59 68  Resp: 18   Temp: 98.4 F (36.9 C)   SpO2: 98%     General: Pt is alert, awake, not in acute distress.  Extremely slow to respond.  Poor historian.  Flat affect.  Currently not agitated.  Foley catheter present. Cardiovascular: Mild intermittent bradycardia present, S1/S2 + Respiratory: bilateral decreased breath sounds at bases Abdominal: Soft, NT, ND, bowel sounds + Extremities: Trace lower extremity edema; no cyanosis    The results of significant diagnostics from this hospitalization (including imaging, microbiology, ancillary and laboratory) are listed below for reference.     Microbiology: No results found for this or any previous visit (from the past 240 hour(s)).   Labs: BNP (last 3 results) No results for input(s): "BNP" in the last 8760 hours. Basic Metabolic Panel: Recent Labs  Lab 01/14/23 0206 01/15/23 0206 01/16/23 0119 01/17/23 0250 01/18/23 0255  NA 137 136 133* 136 136  K 3.1* 4.0 5.3* 4.3 3.9  CL 101 103 106 107 109  CO2 25 22 19* 22 19*  GLUCOSE 137* 166* 114* 114* 107*  BUN 31* 37* 59* 53* 46*  CREATININE 1.77* 2.19* 3.21* 2.13* 1.48*  CALCIUM 8.8* 8.5* 8.4* 8.2* 7.9*  MG  --   --   --  2.1 1.9   Liver Function Tests: Recent Labs  Lab 01/13/23 1340 01/18/23 0255  AST 33 91*  ALT 22 83*  ALKPHOS 63 41  BILITOT 1.0 0.6  PROT 6.8 4.3*  ALBUMIN 4.1 2.3*   No results for input(s): "LIPASE", "AMYLASE" in the last 168 hours. No results for input(s): "AMMONIA" in the last 168 hours. CBC: Recent Labs  Lab 01/14/23 0206 01/15/23 0206 01/16/23 0119 01/17/23 0250 01/18/23 0255  WBC 8.7 11.7* 11.8* 9.2 7.3  NEUTROABS  --   --   --   --  4.7   HGB 9.5* 7.0* 9.3* 8.5* 7.3*  HCT 28.2* 21.1* 27.4* 24.6* 20.8*  MCV 89.5 93.4 87.8 86.6 87.4  PLT 196 175 149* 165 152   Cardiac  Enzymes: No results for input(s): "CKTOTAL", "CKMB", "CKMBINDEX", "TROPONINI" in the last 168 hours. BNP: Invalid input(s): "POCBNP" CBG: No results for input(s): "GLUCAP" in the last 168 hours. D-Dimer No results for input(s): "DDIMER" in the last 72 hours. Hgb A1c No results for input(s): "HGBA1C" in the last 72 hours. Lipid Profile No results for input(s): "CHOL", "HDL", "LDLCALC", "TRIG", "CHOLHDL", "LDLDIRECT" in the last 72 hours. Thyroid function studies No results for input(s): "TSH", "T4TOTAL", "T3FREE", "THYROIDAB" in the last 72 hours.  Invalid input(s): "FREET3" Anemia work up No results for input(s): "VITAMINB12", "FOLATE", "FERRITIN", "TIBC", "IRON", "RETICCTPCT" in the last 72 hours. Urinalysis    Component Value Date/Time   COLORURINE YELLOW 01/17/2023 1300   APPEARANCEUR CLEAR 01/17/2023 1300   LABSPEC 1.012 01/17/2023 1300   PHURINE 5.0 01/17/2023 1300   GLUCOSEU NEGATIVE 01/17/2023 1300   HGBUR NEGATIVE 01/17/2023 1300   BILIRUBINUR NEGATIVE 01/17/2023 1300   KETONESUR NEGATIVE 01/17/2023 1300   PROTEINUR NEGATIVE 01/17/2023 1300   NITRITE NEGATIVE 01/17/2023 1300   LEUKOCYTESUR NEGATIVE 01/17/2023 1300   Sepsis Labs Recent Labs  Lab 01/15/23 0206 01/16/23 0119 01/17/23 0250 01/18/23 0255  WBC 11.7* 11.8* 9.2 7.3   Microbiology No results found for this or any previous visit (from the past 240 hour(s)).   Time coordinating discharge: 35 minutes  SIGNED:   Glade Lloyd, MD  [facility] 01/18/2023, 10:17 AM

## 2023-01-18 NOTE — Discharge Instructions (Addendum)
Orthopaedic Trauma Service Discharge Instructions   General Discharge Instructions  Orthopaedic Injuries:  Right hip fracture treated with intramedullary nailing  WEIGHT BEARING STATUS: Weight-bear as tolerated right leg using a walker  RANGE OF MOTION/ACTIVITY: Unrestricted range of motion right hip and knee.  Activity as tolerated as noted above  Bone health: Vitamin D levels look okay however fracture is suggestive of osteoporosis/fragility fracture.  Recommend bone density scan in the next 4 to 8 weeks  Review the following resource for additional information regarding bone health  [email or web address]  Wound Care: Daily wound care as needed upon arrival to the facility.  Can change dressing daily with a new silicone foam dressing (Mepilex).  Or you can use 4 x 4 gauze and tape.  Okay to leave incision open to the air once there is no drainage.  Clean with soap and water only.  Do not apply any lotions, ointments or solutions such as Betadine, hydrogen peroxide or Neosporin  Discharge Wound Care Instructions  Do NOT apply any ointments, solutions or lotions to pin sites or surgical wounds.  These prevent needed drainage and even though solutions like hydrogen peroxide kill bacteria, they also damage cells lining the pin sites that help fight infection.  Applying lotions or ointments can keep the wounds moist and can cause them to breakdown and open up as well. This can increase the risk for infection. When in doubt call the office.  Surgical incisions should be dressed daily.  If any drainage is noted, use one layer of adaptic or Mepitel, then gauze and tape.  Alternatively you can use a silicone foam dressing such as a  Mepilex  [email or web address] [email or web address]://[email or web address]-soto.com/?pd_rd_[email or web address]&[email or web address]  [email or web address].com/  These dressing supplies should be available at local medical supply stores ([facility], [facility], etc). They are not usually carried at places like [facility], [facility], [facility], etc  Once the incision is completely dry and without drainage, it may be left open to air out.  Showering may begin 36-48 hours later.  Cleaning gently with soap and water.  DVT/PE prophylaxis: Home Plavix  Diet: as you were eating previously.  Can use over the counter stool softeners and bowel preparations, such as Miralax, to help with bowel movements.  Narcotics can be constipating.  Be sure to drink plenty of fluids  PAIN MEDICATION USE AND EXPECTATIONS  You have likely been given narcotic medications to help control your pain.  After a traumatic event that results in an fracture (broken bone) with or without surgery, it is ok to use narcotic pain medications to help control one's pain.  We understand that everyone responds to pain differently and each individual patient will be evaluated on a regular basis for the continued need for narcotic medications. Ideally, narcotic medication use should last no more than 6-8 weeks (coinciding with fracture healing).   As a patient it is your responsibility as well to monitor narcotic medication use and report the amount and frequency you use these medications when you come to your office visit.   We would also advise that if you are using narcotic medications, you should take a dose prior to therapy to maximize you participation.  IF YOU ARE ON NARCOTIC MEDICATIONS IT IS NOT PERMISSIBLE TO OPERATE A MOTOR VEHICLE (MOTORCYCLE/CAR/TRUCK/MOPED) OR HEAVY MACHINERY DO NOT MIX NARCOTICS WITH  OTHER CNS (CENTRAL NERVOUS SYSTEM) DEPRESSANTS SUCH AS ALCOHOL   POST-OPERATIVE OPIOID TAPER INSTRUCTIONS: It is important to wean off of your opioid medication as soon as possible. If you do  not need pain medication after your surgery it is ok to stop day one. Opioids include: Codeine, Hydrocodone(Norco, Vicodin), Oxycodone(Percocet, oxycontin) and hydromorphone amongst others.  Long term and even short term use of opiods can cause: Increased pain response Dependence Constipation Depression Respiratory depression And more.  Withdrawal symptoms can include Flu like symptoms Nausea, vomiting And more Techniques to manage these symptoms Hydrate well Eat regular healthy meals Stay active Use relaxation techniques(deep breathing, meditating, yoga) Do Not substitute Alcohol to help with tapering If you have been on opioids for less than two weeks and do not have pain than it is ok to stop all together.  Plan to wean off of opioids This plan should start within one week post op of your fracture surgery  Maintain the same interval or time between taking each dose and first decrease the dose.  Cut the total daily intake of opioids by one tablet each day Next start to increase the time between doses. The last dose that should be eliminated is the evening dose.    STOP SMOKING OR USING NICOTINE PRODUCTS!!!!  As discussed nicotine severely impairs your body's ability to heal surgical and traumatic wounds but also impairs bone healing.  Wounds and bone heal by forming microscopic blood vessels (angiogenesis) and nicotine is a vasoconstrictor (essentially, shrinks blood vessels).  Therefore, if vasoconstriction occurs to these microscopic blood vessels they essentially disappear and are unable to deliver necessary nutrients to the healing tissue.  This is one modifiable factor that you can do to dramatically increase your chances of healing your injury.    (This means no smoking, no nicotine  gum, patches, etc)  DO NOT USE NONSTEROIDAL ANTI-INFLAMMATORY DRUGS (NSAID'S)  Using products such as Advil (ibuprofen), Aleve (naproxen), Motrin (ibuprofen) for additional pain control during fracture healing can delay and/or prevent the healing response.  If you would like to take over the counter (OTC) medication, Tylenol (acetaminophen) is ok.  However, some narcotic medications that are given for pain control contain acetaminophen as well. Therefore, you should not exceed more than 4000 mg of tylenol in a day if you do not have liver disease.  Also note that there are may OTC medicines, such as cold medicines and allergy medicines that my contain tylenol as well.  If you have any questions about medications and/or interactions please ask your doctor/PA or your pharmacist.      ICE AND ELEVATE INJURED/OPERATIVE EXTREMITY  Using ice and elevating the injured extremity above your heart can help with swelling and pain control.  Icing in a pulsatile fashion, such as 20 minutes on and 20 minutes off, can be followed.    Do not place ice directly on skin. Make sure there is a barrier between to skin and the ice pack.    Using frozen items such as frozen peas works well as the conform nicely to the are that needs to be iced.  USE AN ACE WRAP OR TED HOSE FOR SWELLING CONTROL  In addition to icing and elevation, Ace wraps or TED hose are used to help limit and resolve swelling.  It is recommended to use Ace wraps or TED hose until you are informed to stop.    When using Ace Wraps start the wrapping distally (farthest away from the body) and wrap proximally (closer to the body)   Example: If you had surgery on your leg or thing and you do not have a splint on, start the ace wrap at the toes and work  your way up to the thigh        If you had surgery on your upper extremity and do not have a splint on, start the ace wrap at your fingers and work your way up to the upper arm  IF YOU ARE IN A SPLINT OR CAST  DO NOT REMOVE IT FOR ANY REASON   If your splint gets wet for any reason please contact the office immediately. You may shower in your splint or cast as long as you keep it dry.  This can be done by wrapping in a cast cover or garbage back (or similar)  Do Not stick any thing down your splint or cast such as pencils, money, or hangers to try and scratch yourself with.  If you feel itchy take benadryl as prescribed on the bottle for itching  IF YOU ARE IN A CAM BOOT (BLACK BOOT)  You may remove boot periodically. Perform daily dressing changes as noted below.  Wash the liner of the boot regularly and wear a sock when wearing the boot. It is recommended that you sleep in the boot until told otherwise    Call office for the following: Temperature greater than 101F Persistent nausea and vomiting Severe uncontrolled pain Redness, tenderness, or signs of infection (pain, swelling, redness, odor or green/yellow discharge around the site) Difficulty breathing, headache or visual disturbances Hives Persistent dizziness or light-headedness Extreme fatigue Any other questions or concerns you may have after discharge  In an emergency, call 911 or go to an Emergency Department at a nearby hospital  HELPFUL INFORMATION  If you had a block, it will wear off between 8-24 hrs postop typically.  This is period when your pain may go from nearly zero to the pain you would have had postop without the block.  This is an abrupt transition but nothing dangerous is happening.  You may take an extra dose of narcotic when this happens.  You should wean off your narcotic medicines as soon as you are able.  Most patients will be off or using minimal narcotics before their first postop appointment.   We suggest you use the pain medication the first night prior to going to bed, in order to ease any pain when the anesthesia wears off. You should avoid taking pain medications on an empty stomach as it will make you  nauseous.  Do not drink alcoholic beverages or take illicit drugs when taking pain medications.  In most states it is against the law to drive while you are in a splint or sling.  And certainly against the law to drive while taking narcotics.  You may return to work/school in the next couple of days when you feel up to it.   Pain medication may make you constipated.  Below are a few solutions to try in this order: Decrease the amount of pain medication if you aren't having pain. Drink lots of decaffeinated fluids. Drink prune juice and/or each dried prunes  If the first 3 don't work start with additional solutions Take Colace - an over-the-counter stool softener Take Senokot - an over-the-counter laxative Take Miralax - a stronger over-the-counter laxative     CALL THE OFFICE WITH ANY QUESTIONS OR CONCERNS: [phone]   VISIT OUR WEBSITE FOR ADDITIONAL INFORMATION: [email or web address]

## 2023-01-18 NOTE — Plan of Care (Signed)

## 2023-01-18 NOTE — TOC Transition Note (Signed)
Transition of Care [facility]) - CM/SW Discharge Note   Patient Details  Name: Tamara Bullock MRN: [id number] Date of Birth: 26-Jan-1941  [facility] CM/SW Contact:  Lorri Frederick, LCSW Phone Number: 01/18/2023, 11:51 AM   Clinical Narrative:   Pt discharging to [facility].  RN call report to [phone].  1145: CSW spoke with Delorise Shiner Ann/[facility], confirmed that they can receive pt today.    Final next level of care: Skilled Nursing Facility Barriers to Discharge: Barriers Resolved   Patient Goals and CMS Choice   Choice offered to / list presented to : Adult Children  Discharge Placement                Patient chooses bed at:  ([facility]) Patient to be transferred to facility by: [facility] Name of family member notified: daughter Darreld Mclean in room Patient and family notified of of transfer: 01/18/23  Discharge Plan and Services Additional resources added to the After Visit Summary for   In-house Referral: Clinical Social Work   Post Acute Care Choice: Skilled Nursing Facility                               Social Determinants of Health (SDOH) Interventions SDOH Screenings   Food Insecurity: No Food Insecurity (01/13/2023)  Housing: Low Risk  (01/13/2023)  Transportation Needs: No Transportation Needs (01/13/2023)  Utilities: Not At Risk (01/13/2023)  Depression (PHQ2-9): Low Risk  (05/17/2022)  Recent Concern: Depression (PHQ2-9) - High Risk (04/04/2022)  Tobacco Use: Low Risk  (01/16/2023)     Readmission Risk Interventions     No data to display

## 2023-01-23 ENCOUNTER — Encounter (HOSPITAL_COMMUNITY): Payer: Self-pay

## 2023-01-23 ENCOUNTER — Emergency Department (HOSPITAL_COMMUNITY): Payer: Medicare Other

## 2023-01-23 ENCOUNTER — Emergency Department (HOSPITAL_COMMUNITY)
Admission: EM | Admit: 2023-01-23 | Discharge: 2023-01-23 | Disposition: A | Discharge status: Home / Self Care | Payer: Medicare Other | Attending: Emergency Medicine | Admitting: Emergency Medicine

## 2023-01-23 ENCOUNTER — Other Ambulatory Visit: Payer: Self-pay

## 2023-01-23 DIAGNOSIS — I509 Heart failure, unspecified: Secondary | ICD-10-CM | POA: Diagnosis not present

## 2023-01-23 DIAGNOSIS — Z7901 Long term (current) use of anticoagulants: Secondary | ICD-10-CM | POA: Diagnosis not present

## 2023-01-23 DIAGNOSIS — N189 Chronic kidney disease, unspecified: Secondary | ICD-10-CM | POA: Diagnosis not present

## 2023-01-23 DIAGNOSIS — I130 Hypertensive heart and chronic kidney disease with heart failure and stage 1 through stage 4 chronic kidney disease, or unspecified chronic kidney disease: Secondary | ICD-10-CM | POA: Diagnosis not present

## 2023-01-23 DIAGNOSIS — K5289 Other specified noninfective gastroenteritis and colitis: Secondary | ICD-10-CM | POA: Insufficient documentation

## 2023-01-23 DIAGNOSIS — E039 Hypothyroidism, unspecified: Secondary | ICD-10-CM | POA: Diagnosis not present

## 2023-01-23 DIAGNOSIS — Z79899 Other long term (current) drug therapy: Secondary | ICD-10-CM | POA: Insufficient documentation

## 2023-01-23 DIAGNOSIS — R1031 Right lower quadrant pain: Secondary | ICD-10-CM | POA: Diagnosis present

## 2023-01-23 LAB — COMPREHENSIVE METABOLIC PANEL
ALT: 64 U/L — ABNORMAL HIGH (ref 0–44)
AST: 60 U/L — ABNORMAL HIGH (ref 15–41)
Albumin: 2.3 g/dL — ABNORMAL LOW (ref 3.5–5.0)
Alkaline Phosphatase: 72 U/L (ref 38–126)
Anion gap: 9 (ref 5–15)
BUN: 17 mg/dL (ref 8–23)
CO2: 20 mmol/L — ABNORMAL LOW (ref 22–32)
Calcium: 8.6 mg/dL — ABNORMAL LOW (ref 8.9–10.3)
Chloride: 106 mmol/L (ref 98–111)
Creatinine, Ser: 0.93 mg/dL (ref 0.44–1.00)
GFR, Estimated: >60 mL/min (ref >60)
Glucose, Bld: 110 mg/dL — ABNORMAL HIGH (ref 70–99)
Potassium: 3.5 mmol/L (ref 3.5–5.1)
Sodium: 135 mmol/L (ref 135–145)
Total Bilirubin: 1.4 mg/dL — ABNORMAL HIGH (ref 0.3–1.2)
Total Protein: 4.9 g/dL — ABNORMAL LOW (ref 6.5–8.1)

## 2023-01-23 LAB — I-STAT CHEM 8, ED
BUN: 17 mg/dL (ref 8–23)
Calcium, Ion: 1.23 mmol/L (ref 1.15–1.40)
Chloride: 107 mmol/L (ref 98–111)
Creatinine, Ser: 0.9 mg/dL (ref 0.44–1.00)
Glucose, Bld: 102 mg/dL — ABNORMAL HIGH (ref 70–99)
HCT: 23 % — ABNORMAL LOW (ref 36.0–46.0)
Hemoglobin: 7.8 g/dL — ABNORMAL LOW (ref 12.0–15.0)
Potassium: 3.7 mmol/L (ref 3.5–5.1)
Sodium: 137 mmol/L (ref 135–145)
TCO2: 24 mmol/L (ref 22–32)

## 2023-01-23 LAB — CBC WITH DIFFERENTIAL/PLATELET
Abs Immature Granulocytes: 0.07 10*3/uL (ref 0.00–0.07)
Basophils Absolute: 0.1 10*3/uL (ref 0.0–0.1)
Basophils Relative: 1 %
Eosinophils Absolute: 0.2 10*3/uL (ref 0.0–0.5)
Eosinophils Relative: 2 %
HCT: 23.7 % — ABNORMAL LOW (ref 36.0–46.0)
Hemoglobin: 7.6 g/dL — ABNORMAL LOW (ref 12.0–15.0)
Immature Granulocytes: 1 %
Lymphocytes Relative: 8 %
Lymphs Abs: 0.8 10*3/uL (ref 0.7–4.0)
MCH: 29.6 pg (ref 26.0–34.0)
MCHC: 32.1 g/dL (ref 30.0–36.0)
MCV: 92.2 fL (ref 80.0–100.0)
Monocytes Absolute: 0.8 10*3/uL (ref 0.1–1.0)
Monocytes Relative: 8 %
Neutro Abs: 8 10*3/uL — ABNORMAL HIGH (ref 1.7–7.7)
Neutrophils Relative %: 80 %
Platelets: 362 10*3/uL (ref 150–400)
RBC: 2.57 MIL/uL — ABNORMAL LOW (ref 3.87–5.11)
RDW: 14.9 % (ref 11.5–15.5)
WBC: 10 10*3/uL (ref 4.0–10.5)
nRBC: 0 % (ref 0.0–0.2)

## 2023-01-23 LAB — LIPASE, BLOOD: Lipase: 28 U/L (ref 11–51)

## 2023-01-23 MED ORDER — ACETAMINOPHEN 500 MG PO TABS: 1000.0000 mg | ORAL_TABLET | Freq: Once | ORAL | Status: DC

## 2023-01-23 MED ORDER — IOHEXOL 350 MG/ML SOLN
75.0000 mL | Freq: Once | INTRAVENOUS | Status: AC | PRN
  Administered 2023-01-23: 75 mL via INTRAVENOUS

## 2023-01-23 NOTE — ED Triage Notes (Signed)
Pt BIB [facility] from [facility] d/t abd pain that started couple of days ago. Per facility, pt was diagnosed w ileus. 2.5 mg oxycodone given by facility. Pt is confused at baseline.   133/54 HR 54  SPO2 93%

## 2023-01-23 NOTE — ED Provider Notes (Signed)
[place] [facility]GENC[facility] DEPARTMENT AT [facility] Provider Note   CSN: [id number] Arrival date & time: 01/23/23  1111     History  Chief Complaint  Patient presents with   Abdominal Pain    Tamara Bullock is a 82 y.o. female.  The history is provided by the patient, a relative, medical records and the EMS personnel. No language interpreter was used.  Abdominal Pain    82 year old female with history of prior stroke currently on Plavix, hypertension, CKD, CHF, hypothyroidism, brought here via EMS from skilled nursing facility with complaint of abdominal pain.  History obtained through patient and through daughter who is at bedside.  Patient recently fell and broke her right hip and was found to have a right intracordal trochanteric fracture for which she underwent ORIF on 01/14/2023.  She was placed in a skilled nursing facility for placement at which time she developed acute kidney injury and received IV fluid..  She also developed acute urinary retention requiring a Foley catheter placement on 4/18.  For the past few days patient endorses feeling nauseous, having progressive worsening abdominal pain, and not having any bowel movement.  Daughter report patient had an x-ray done at the facility yesterday that shows ileus.  Patient was given enema and suppository to help with a bowel movement with minimal relief.  Due to her worsening symptoms, she was sent here for further assessment.  At this time patient endorses pain to her right lower abdomen but denies any chest pain or shortness of breath.  No fever or chills.  Home Medications Prior to Admission medications   Medication Sig Start Date End Date Taking? Authorizing Provider  acetaminophen (TYLENOL) 325 MG tablet Take 2 tablets (650 mg total) by mouth every 6 (six) hours as needed. 01/18/23   Montez Morita, PA-C  atorvastatin (LIPITOR) 80 MG tablet Take 1 tablet (80 mg total) by mouth daily. Patient taking differently: Take 80 mg  by mouth every evening. 03/15/22   Love, Evlyn Kanner, PA-C  carvedilol (COREG) 3.125 MG tablet Take 1 tablet (3.125 mg total) by mouth 2 (two) times daily with a meal. 12/25/22   Maisie Fus, MD  clopidogrel (PLAVIX) 75 MG tablet Take 1 tablet (75 mg total) by mouth daily. 06/21/22   Marjie Skiff E, PA-C  docusate sodium (COLACE) 100 MG capsule Take 1 capsule (100 mg total) by mouth 2 (two) times daily. 01/18/23   Glade Lloyd, MD  EUTHYROX 50 MCG tablet Take 50 mcg by mouth daily. 02/19/22   [provider]  ezetimibe (ZETIA) 10 MG tablet Take 1 tablet (10 mg total) by mouth daily. 11/07/22   Marjie Skiff E, PA-C  LORazepam (ATIVAN) 0.5 MG tablet Take 1 tablet (0.5 mg total) by mouth daily as needed for anxiety. 01/18/23   Glade Lloyd, MD  methocarbamol (ROBAXIN) 500 MG tablet Take 1 tablet (500 mg total) by mouth every 6 (six) hours as needed for muscle spasms. 01/18/23   Glade Lloyd, MD  mirtazapine (REMERON) 7.5 MG tablet Take 1 tablet (7.5 mg total) by mouth at bedtime. 03/15/22   Love, Evlyn Kanner, PA-C  oxyCODONE (OXY IR/ROXICODONE) 5 MG immediate release tablet Take 0.5-1 tablets (2.5-5 mg total) by mouth every 4 (four) hours as needed for moderate pain or severe pain. 01/18/23   Montez Morita, PA-C  polyethylene glycol (MIRALAX / GLYCOLAX) 17 g packet Take 17 g by mouth daily as needed for mild constipation. 01/18/23   Glade Lloyd, MD  sertraline (ZOLOFT)  50 MG tablet Take 50 mg by mouth daily.    [provider]      Allergies    Diphenhydramine hcl, Moxifloxacin hcl in nacl, Penicillin g, Sulfa antibiotics, Colistin, Diclofenac sodium, Esomeprazole, Estradiol, Medroxyprogesterone acetate, Neomycin-polymyxin-hc, Nitrofurantoin, Nsaids, Prednisone, Proparacaine, Rabeprazole, Amitriptyline, Aspirin, Azithromycin, Carbocaine [mepivacaine], Cimetidine, Ciprofloxacin, Cisapride, Clindamycin/lincomycin, Cortisporin-tc [neomycin-colist-hc-thonzonium], Escitalopram,  Hydrochlorothiazide, Hyoscyamine, Ibuprofen, Iodine, Medroxyprogesterone, Meloxicam, Minocycline, Moxifloxacin, Nitrofuran derivatives, Ofloxacin, Pantoprazole, Penicillins, Pneumococcal vaccines, Prednisolone, Prevacid [lansoprazole], Propoxyphene, Soap, Spironolactone, Tobradex [tobramycin-dexamethasone], Trolamine salicylate, Verapamil, Voltaren [diclofenac], Diphenhydramine, Hydrocodone, and Tramadol    Review of Systems   Review of Systems  Gastrointestinal:  Positive for abdominal pain.  All other systems reviewed and are negative.   Physical Exam Updated Vital Signs BP (!) 122/41 (BP Location: Left Arm)   Pulse (!) 52   Temp 98.1 F (36.7 C) (Oral)   Resp 17   SpO2 100%  Physical Exam Vitals and nursing note reviewed.  Constitutional:      General: She is not in acute distress.    Appearance: She is well-developed.     Comments: Elderly female laying in bed appears slightly uncomfortable.  HENT:     Head: Atraumatic.  Eyes:     Conjunctiva/sclera: Conjunctivae normal.  Cardiovascular:     Rate and Rhythm: Bradycardia present.     Heart sounds: Normal heart sounds.  Pulmonary:     Effort: Pulmonary effort is normal.  Abdominal:     General: Bowel sounds are decreased.     Palpations: Abdomen is soft.     Tenderness: There is abdominal tenderness in the right lower quadrant.     Hernia: No hernia is present.  Musculoskeletal:     Cervical back: Neck supple.  Skin:    Findings: No rash.  Neurological:     Mental Status: She is alert.  Psychiatric:        Mood and Affect: Mood normal.     ED Results / Procedures / Treatments   Labs (all labs ordered are listed, but only abnormal results are displayed) Labs Reviewed  CBC WITH DIFFERENTIAL/PLATELET - Abnormal; Notable for the following components:      Result Value   RBC 2.57 (*)    Hemoglobin 7.6 (*)    HCT 23.7 (*)    Neutro Abs 8.0 (*)    All other components within normal limits  COMPREHENSIVE METABOLIC  PANEL - Abnormal; Notable for the following components:   CO2 20 (*)    Glucose, Bld 110 (*)    Calcium 8.6 (*)    Total Protein 4.9 (*)    Albumin 2.3 (*)    AST 60 (*)    ALT 64 (*)    Total Bilirubin 1.4 (*)    All other components within normal limits  I-STAT CHEM 8, ED - Abnormal; Notable for the following components:   Glucose, Bld 102 (*)    Hemoglobin 7.8 (*)    HCT 23.0 (*)    All other components within normal limits  LIPASE, BLOOD  URINALYSIS, ROUTINE W REFLEX MICROSCOPIC    EKG None  Radiology CT ABDOMEN PELVIS W CONTRAST  Result Date: 01/23/2023 CLINICAL DATA:  Constipation, ileus on recent x-ray. Abdominal pain for a couple days. Confusion. EXAM: CT ABDOMEN AND PELVIS WITH CONTRAST TECHNIQUE: Multidetector CT imaging of the abdomen and pelvis was performed using the standard protocol following bolus administration of intravenous contrast. RADIATION DOSE REDUCTION: This exam was performed according to the departmental dose-optimization program which includes automated exposure  control, adjustment of the mA and/or kV according to patient size and/or use of iterative reconstruction technique. CONTRAST:  75mL OMNIPAQUE IOHEXOL 350 MG/ML SOLN COMPARISON:  Abdominopelvic CT 01/13/2023 and radiographs. FINDINGS: Lower chest: Streaky atelectasis at both lung bases with interval development of trace bilateral pleural effusions. The heart size is stable. There is aortic and coronary artery atherosclerosis. Hepatobiliary: Heterogeneous peripheral low-density in the liver suggesting chronic passive congestion. No focal abnormalities are demonstrated on the delayed post-contrast images. No evidence of gallstones, gallbladder wall thickening or biliary dilatation. Pancreas: Unremarkable. No pancreatic ductal dilatation or surrounding inflammatory changes. Spleen: Normal in size without focal abnormality. Adrenals/Urinary Tract: Both adrenal glands appear normal. Punctate nonobstructing  calculus in the upper pole of the left kidney and a small right renal cyst (for which no follow-up is recommended) are stable. No evidence of ureteral calculus or residual hydronephrosis. The bladder is decompressed by a Foley catheter. Stomach/Bowel: No enteric contrast administered. The stomach appears unremarkable for its degree of distention. There is no small bowel distension, wall thickening or surrounding inflammation. Mildly prominent stool throughout the colon, decreased in volume within the rectum over the interval. New mild rectal wall thickening with inflammatory changes in the perirectal fat. The colon otherwise appears unremarkable. The appendix appears normal. Vascular/Lymphatic: There are no enlarged abdominal or pelvic lymph nodes. Aortic and branch vessel atherosclerosis without evidence of aneurysm or large vessel occlusion. Reproductive: Hysterectomy.  No adnexal mass. Other: Intact abdominal wall. New trace ascites with perirectal soft tissue stranding and asymmetric edema throughout the subcutaneous fat, greater on the right. No pneumoperitoneum. Musculoskeletal: Interval dynamic screw fixation of the previously demonstrated comminuted right femoral intertrochanteric fracture. The alignment of the fracture is improved. Unchanged T12 compression fracture no new fractures are identified. There is a small amount of hemorrhage within the subcutaneous fat and gluteus musculature in the upper right buttocks. Asymmetric subcutaneous edema in the right lateral abdominal wall and proximal right thigh. IMPRESSION: 1. Interval dynamic screw fixation of the previously demonstrated comminuted right femoral intertrochanteric fracture with improved alignment. No new fractures are identified. 2. Asymmetric subcutaneous edema in the right lateral abdominal wall and proximal right thigh with a small amount of hemorrhage in the subcutaneous fat and gluteus musculature in the upper right buttocks. 3. New mild  rectal wall thickening with inflammatory changes in the perirectal fat, suspicious for stercoral colitis/proctitis. No evidence of bowel obstruction or perforation. 4. New trace bilateral pleural effusions with bibasilar atelectasis. 5. Heterogeneous peripheral low-density in the liver suggesting chronic passive congestion. 6.  Aortic Atherosclerosis (ICD10-I70.0). Electronically Signed   By: Carey Bullocks M.D.   On: 01/23/2023 13:20    Procedures Procedures    Medications Ordered in ED Medications  iohexol (OMNIPAQUE) 350 MG/ML injection 75 mL (75 mLs Intravenous Contrast Given 01/23/23 1259)    ED Course/ Medical Decision Making/ A&P Clinical Course as of 01/23/23 1433  Wed Jan 23, 2023  [id number] 82 year old female here for evaluation of abdominal pain.  Lab work showing worsening anemia and elevation of LFTs.  Getting CT imaging.  Disposition per results of testing. [MB]    Clinical Course User Index [MB] Terrilee Files, MD                             Medical Decision Making Amount and/or Complexity of Data Reviewed Labs: ordered. Radiology: ordered.  Risk Prescription drug management.   BP (!) 122/41 (BP Location: Left Arm)  Pulse (!) 52   Temp 98.1 F (36.7 C) (Oral)   Resp 17   SpO2 100%   21:49 AM  82 year old female with history of prior stroke currently on Plavix, hypertension, CKD, CHF, hypothyroidism, brought here via EMS from skilled nursing facility with complaint of abdominal pain.  History obtained through patient and through daughter who is at bedside.  Patient recently fell and broke her right hip and was found to have a right intracordal trochanteric fracture for which she underwent ORIF on 01/14/2023.  She was placed in a skilled nursing facility for placement at which time she developed acute kidney injury and received IV fluid..  She also developed acute urinary retention requiring a Foley catheter placement on 4/18.  For the past few days patient endorses  feeling nauseous, having progressive worsening abdominal pain, and not having any bowel movement.  Daughter report patient had an x-ray done at the facility yesterday that shows ileus.  Patient was given enema and suppository to help with a bowel movement with minimal relief.  Due to her worsening symptoms, she was sent here for further assessment.  At this time patient endorses pain to her right lower abdomen but denies any chest pain or shortness of breath.  No fever or chills.  On exam elderly female laying in bed appears mildly in discomfort but nontoxic in appearance.  Heart with bradycardia cardia, lungs are clear, abdomen is minimally distended with decreased bowel sounds and tenderness to right lower quadrant but no guarding or rebound tenderness.  Vitals are reviewed overall reassuring, no fever no hypoxia.  -Labs ordered, independently viewed and interpreted by me.  Labs remarkable for Hgb 7.6, it was 7.3 five days ago. Mild transaminitis with AST 60, ALT 64. -The patient was maintained on a cardiac monitor.  I personally viewed and interpreted the cardiac monitored which showed an underlying rhythm of: sinus bradycardia -Imaging independently viewed and interpreted by me and I agree with radiologist's interpretation.  Result remarkable for abd/pelvis CT showing no new right hip fracture identified.  The subcutaneous edema to the right lateral abdominal wall and proximal right thigh with a small amount of hemorrhage in the subcutaneous fat including his musculature in the upper right buttock.  This could be correlating to patient's anemia.  Patient also have new rectal wall thickening with premature changes to the perirectal fat suspicious for Stercoral colitis/proctitis.  No evidence of SBO. -This patient presents to the ED for concern of abd pain, this involves an extensive number of treatment options, and is a complaint that carries with it a high risk of complications and morbidity.  The  differential diagnosis includes ileus, SBO, constipation, bowel perforation, abscess, hip fx, post surgical complication -Co morbidities that complicate the patient evaluation includes stroke, CKD, CHF -Treatment includes vicodin -Reevaluation of the patient after these medicines showed that the patient improved -PCP office notes or outside notes reviewed -Discussion with attending DR. Charm Barges -Escalation to admission/observation considered: patients feels much better, is comfortable with discharge, and will follow up with PCP -Prescription medication considered, patient comfortable with augmentin for colitis -Social Determinant of Health considered   3:08 PM Likely patient did have a bowel movement and felt better.  Initial plan was to prescribe antibiotic to cover for colitis however patient is allergic to multiple antibiotics which includes Augmentin, and Cipro therefore after weighing risks and benefits, without antibiotic would be more risky. No abx prescribed.  Outpatient follow-up with orthopedics recommended.  Return precaution given.  Final Clinical Impression(s) / ED Diagnoses Final diagnoses:  Stercoral colitis    Rx / DC Orders ED Discharge Orders     None         Fayrene Helper, PA-C 01/23/23 1514    Terrilee Files, MD 01/23/23 1726

## 2023-01-23 NOTE — Discharge Instructions (Addendum)
Your abdominal pain may be due to colitis which is inflammation perhaps infection of your colon.  This usually will resolve over time however if you develop fever, worsening abdominal pain, vomiting, return to ER for reassessment.  You may use enema if you are having difficulty having bowel movement but avoid enema use if you are able to have bowel movement.  Follow-up with your surgeon for further managements of your right hip injury.

## 2023-01-23 NOTE — ED Notes (Signed)
[facility] called, two ahead of patient.

## 2023-01-28 ENCOUNTER — Other Ambulatory Visit: Payer: Self-pay

## 2023-01-28 ENCOUNTER — Encounter (HOSPITAL_COMMUNITY): Payer: Self-pay

## 2023-01-28 ENCOUNTER — Inpatient Hospital Stay (HOSPITAL_COMMUNITY)
Admission: EM | Admit: 2023-01-28 | Discharge: 2023-02-02 | DRG: 698 | Disposition: A | Discharge status: Skilled Nursing Facility | Payer: Medicare Other | Source: Skilled Nursing Facility | Attending: Internal Medicine | Admitting: Internal Medicine

## 2023-01-28 DIAGNOSIS — I10 Essential (primary) hypertension: Secondary | ICD-10-CM | POA: Diagnosis present

## 2023-01-28 DIAGNOSIS — Z880 Allergy status to penicillin: Secondary | ICD-10-CM

## 2023-01-28 DIAGNOSIS — K5289 Other specified noninfective gastroenteritis and colitis: Secondary | ICD-10-CM | POA: Diagnosis present

## 2023-01-28 DIAGNOSIS — A419 Sepsis, unspecified organism: Secondary | ICD-10-CM | POA: Diagnosis not present

## 2023-01-28 DIAGNOSIS — J90 Pleural effusion, not elsewhere classified: Secondary | ICD-10-CM

## 2023-01-28 DIAGNOSIS — K5900 Constipation, unspecified: Secondary | ICD-10-CM | POA: Diagnosis present

## 2023-01-28 DIAGNOSIS — N1831 Chronic kidney disease, stage 3a: Secondary | ICD-10-CM | POA: Diagnosis present

## 2023-01-28 DIAGNOSIS — E039 Hypothyroidism, unspecified: Secondary | ICD-10-CM | POA: Diagnosis present

## 2023-01-28 DIAGNOSIS — S72001D Fracture of unspecified part of neck of right femur, subsequent encounter for closed fracture with routine healing: Secondary | ICD-10-CM

## 2023-01-28 DIAGNOSIS — F05 Delirium due to known physiological condition: Secondary | ICD-10-CM | POA: Diagnosis present

## 2023-01-28 DIAGNOSIS — I639 Cerebral infarction, unspecified: Secondary | ICD-10-CM | POA: Diagnosis present

## 2023-01-28 DIAGNOSIS — Z8673 Personal history of transient ischemic attack (TIA), and cerebral infarction without residual deficits: Secondary | ICD-10-CM

## 2023-01-28 DIAGNOSIS — F32A Depression, unspecified: Secondary | ICD-10-CM | POA: Diagnosis present

## 2023-01-28 DIAGNOSIS — Z882 Allergy status to sulfonamides status: Secondary | ICD-10-CM

## 2023-01-28 DIAGNOSIS — I130 Hypertensive heart and chronic kidney disease with heart failure and stage 1 through stage 4 chronic kidney disease, or unspecified chronic kidney disease: Secondary | ICD-10-CM | POA: Diagnosis present

## 2023-01-28 DIAGNOSIS — Z886 Allergy status to analgesic agent status: Secondary | ICD-10-CM

## 2023-01-28 DIAGNOSIS — E785 Hyperlipidemia, unspecified: Secondary | ICD-10-CM | POA: Diagnosis present

## 2023-01-28 DIAGNOSIS — G4733 Obstructive sleep apnea (adult) (pediatric): Secondary | ICD-10-CM | POA: Diagnosis present

## 2023-01-28 DIAGNOSIS — F0393 Unspecified dementia, unspecified severity, with mood disturbance: Secondary | ICD-10-CM | POA: Diagnosis present

## 2023-01-28 DIAGNOSIS — N390 Urinary tract infection, site not specified: Secondary | ICD-10-CM | POA: Diagnosis present

## 2023-01-28 DIAGNOSIS — T83511A Infection and inflammatory reaction due to indwelling urethral catheter, initial encounter: Principal | ICD-10-CM | POA: Diagnosis present

## 2023-01-28 DIAGNOSIS — D631 Anemia in chronic kidney disease: Secondary | ICD-10-CM | POA: Diagnosis present

## 2023-01-28 DIAGNOSIS — Z9109 Other allergy status, other than to drugs and biological substances: Secondary | ICD-10-CM

## 2023-01-28 DIAGNOSIS — Z823 Family history of stroke: Secondary | ICD-10-CM

## 2023-01-28 DIAGNOSIS — Z7902 Long term (current) use of antithrombotics/antiplatelets: Secondary | ICD-10-CM

## 2023-01-28 DIAGNOSIS — Z79890 Hormone replacement therapy (postmenopausal): Secondary | ICD-10-CM

## 2023-01-28 DIAGNOSIS — R001 Bradycardia, unspecified: Secondary | ICD-10-CM | POA: Diagnosis present

## 2023-01-28 DIAGNOSIS — F0394 Unspecified dementia, unspecified severity, with anxiety: Secondary | ICD-10-CM | POA: Diagnosis present

## 2023-01-28 DIAGNOSIS — Z8249 Family history of ischemic heart disease and other diseases of the circulatory system: Secondary | ICD-10-CM

## 2023-01-28 DIAGNOSIS — I5042 Chronic combined systolic (congestive) and diastolic (congestive) heart failure: Secondary | ICD-10-CM | POA: Diagnosis present

## 2023-01-28 DIAGNOSIS — L8990 Pressure ulcer of unspecified site, unspecified stage: Secondary | ICD-10-CM | POA: Insufficient documentation

## 2023-01-28 DIAGNOSIS — N3000 Acute cystitis without hematuria: Principal | ICD-10-CM

## 2023-01-28 DIAGNOSIS — Z881 Allergy status to other antibiotic agents status: Secondary | ICD-10-CM

## 2023-01-28 DIAGNOSIS — Z79899 Other long term (current) drug therapy: Secondary | ICD-10-CM

## 2023-01-28 DIAGNOSIS — Z888 Allergy status to other drugs, medicaments and biological substances status: Secondary | ICD-10-CM

## 2023-01-28 DIAGNOSIS — R531 Weakness: Secondary | ICD-10-CM | POA: Diagnosis present

## 2023-01-28 DIAGNOSIS — F03911 Unspecified dementia, unspecified severity, with agitation: Secondary | ICD-10-CM | POA: Diagnosis present

## 2023-01-28 LAB — CBC WITH DIFFERENTIAL/PLATELET
Abs Immature Granulocytes: 0.04 10*3/uL (ref 0.00–0.07)
Basophils Absolute: 0.1 10*3/uL (ref 0.0–0.1)
Basophils Relative: 1 %
Eosinophils Absolute: 0.2 10*3/uL (ref 0.0–0.5)
Eosinophils Relative: 4 %
HCT: 24.5 % — ABNORMAL LOW (ref 36.0–46.0)
Hemoglobin: 7.8 g/dL — ABNORMAL LOW (ref 12.0–15.0)
Immature Granulocytes: 1 %
Lymphocytes Relative: 18 %
Lymphs Abs: 1.2 10*3/uL (ref 0.7–4.0)
MCH: 29.4 pg (ref 26.0–34.0)
MCHC: 31.8 g/dL (ref 30.0–36.0)
MCV: 92.5 fL (ref 80.0–100.0)
Monocytes Absolute: 0.7 10*3/uL (ref 0.1–1.0)
Monocytes Relative: 12 %
Neutro Abs: 4.1 10*3/uL (ref 1.7–7.7)
Neutrophils Relative %: 64 %
Platelets: 442 10*3/uL — ABNORMAL HIGH (ref 150–400)
RBC: 2.65 MIL/uL — ABNORMAL LOW (ref 3.87–5.11)
RDW: 15.5 % (ref 11.5–15.5)
WBC: 6.3 10*3/uL (ref 4.0–10.5)
nRBC: 0 % (ref 0.0–0.2)

## 2023-01-28 LAB — COMPREHENSIVE METABOLIC PANEL
ALT: 30 U/L (ref 0–44)
AST: 28 U/L (ref 15–41)
Albumin: 2.5 g/dL — ABNORMAL LOW (ref 3.5–5.0)
Alkaline Phosphatase: 114 U/L (ref 38–126)
Anion gap: 6 (ref 5–15)
BUN: 15 mg/dL (ref 8–23)
CO2: 21 mmol/L — ABNORMAL LOW (ref 22–32)
Calcium: 8.1 mg/dL — ABNORMAL LOW (ref 8.9–10.3)
Chloride: 107 mmol/L (ref 98–111)
Creatinine, Ser: 0.94 mg/dL (ref 0.44–1.00)
GFR, Estimated: >60 mL/min (ref >60)
Glucose, Bld: 106 mg/dL — ABNORMAL HIGH (ref 70–99)
Potassium: 4 mmol/L (ref 3.5–5.1)
Sodium: 134 mmol/L — ABNORMAL LOW (ref 135–145)
Total Bilirubin: 1.1 mg/dL (ref 0.3–1.2)
Total Protein: 5.4 g/dL — ABNORMAL LOW (ref 6.5–8.1)

## 2023-01-28 LAB — LIPASE, BLOOD: Lipase: 30 U/L (ref 11–51)

## 2023-01-28 NOTE — ED Triage Notes (Signed)
BIBA from [facility] for lethargy, surgery on 4/15 for Rt femur Fx and since then she has not returned back to baseline. Discharged from [facility] yesterday for Colitis and daughter wants that evaluated also. Was taken off blood thinners for surgery and has gained 20 lbs

## 2023-01-29 ENCOUNTER — Emergency Department (HOSPITAL_COMMUNITY): Payer: Medicare Other

## 2023-01-29 DIAGNOSIS — E785 Hyperlipidemia, unspecified: Secondary | ICD-10-CM | POA: Diagnosis present

## 2023-01-29 DIAGNOSIS — I5042 Chronic combined systolic (congestive) and diastolic (congestive) heart failure: Secondary | ICD-10-CM | POA: Diagnosis present

## 2023-01-29 DIAGNOSIS — F0393 Unspecified dementia, unspecified severity, with mood disturbance: Secondary | ICD-10-CM | POA: Diagnosis present

## 2023-01-29 DIAGNOSIS — N3000 Acute cystitis without hematuria: Secondary | ICD-10-CM | POA: Diagnosis not present

## 2023-01-29 DIAGNOSIS — F03911 Unspecified dementia, unspecified severity, with agitation: Secondary | ICD-10-CM | POA: Diagnosis present

## 2023-01-29 DIAGNOSIS — I639 Cerebral infarction, unspecified: Secondary | ICD-10-CM | POA: Diagnosis not present

## 2023-01-29 DIAGNOSIS — Z79890 Hormone replacement therapy (postmenopausal): Secondary | ICD-10-CM | POA: Diagnosis not present

## 2023-01-29 DIAGNOSIS — Z8249 Family history of ischemic heart disease and other diseases of the circulatory system: Secondary | ICD-10-CM | POA: Diagnosis not present

## 2023-01-29 DIAGNOSIS — A419 Sepsis, unspecified organism: Secondary | ICD-10-CM | POA: Insufficient documentation

## 2023-01-29 DIAGNOSIS — K5900 Constipation, unspecified: Secondary | ICD-10-CM | POA: Diagnosis present

## 2023-01-29 DIAGNOSIS — Z7902 Long term (current) use of antithrombotics/antiplatelets: Secondary | ICD-10-CM | POA: Diagnosis not present

## 2023-01-29 DIAGNOSIS — I130 Hypertensive heart and chronic kidney disease with heart failure and stage 1 through stage 4 chronic kidney disease, or unspecified chronic kidney disease: Secondary | ICD-10-CM | POA: Diagnosis present

## 2023-01-29 DIAGNOSIS — G4733 Obstructive sleep apnea (adult) (pediatric): Secondary | ICD-10-CM | POA: Diagnosis present

## 2023-01-29 DIAGNOSIS — E039 Hypothyroidism, unspecified: Secondary | ICD-10-CM | POA: Diagnosis present

## 2023-01-29 DIAGNOSIS — N390 Urinary tract infection, site not specified: Secondary | ICD-10-CM | POA: Diagnosis present

## 2023-01-29 DIAGNOSIS — K5289 Other specified noninfective gastroenteritis and colitis: Secondary | ICD-10-CM | POA: Diagnosis present

## 2023-01-29 DIAGNOSIS — Z8673 Personal history of transient ischemic attack (TIA), and cerebral infarction without residual deficits: Secondary | ICD-10-CM | POA: Diagnosis not present

## 2023-01-29 DIAGNOSIS — T83511A Infection and inflammatory reaction due to indwelling urethral catheter, initial encounter: Secondary | ICD-10-CM | POA: Diagnosis present

## 2023-01-29 DIAGNOSIS — F32A Depression, unspecified: Secondary | ICD-10-CM | POA: Diagnosis present

## 2023-01-29 DIAGNOSIS — Z823 Family history of stroke: Secondary | ICD-10-CM | POA: Diagnosis not present

## 2023-01-29 DIAGNOSIS — S72001D Fracture of unspecified part of neck of right femur, subsequent encounter for closed fracture with routine healing: Secondary | ICD-10-CM | POA: Diagnosis not present

## 2023-01-29 DIAGNOSIS — F05 Delirium due to known physiological condition: Secondary | ICD-10-CM | POA: Diagnosis present

## 2023-01-29 DIAGNOSIS — D631 Anemia in chronic kidney disease: Secondary | ICD-10-CM | POA: Diagnosis present

## 2023-01-29 DIAGNOSIS — N1831 Chronic kidney disease, stage 3a: Secondary | ICD-10-CM | POA: Diagnosis present

## 2023-01-29 DIAGNOSIS — Z79899 Other long term (current) drug therapy: Secondary | ICD-10-CM | POA: Diagnosis not present

## 2023-01-29 DIAGNOSIS — F0394 Unspecified dementia, unspecified severity, with anxiety: Secondary | ICD-10-CM | POA: Diagnosis present

## 2023-01-29 LAB — URINALYSIS, ROUTINE W REFLEX MICROSCOPIC
Bilirubin Urine: NEGATIVE
Glucose, UA: NEGATIVE mg/dL
Ketones, ur: NEGATIVE mg/dL
Nitrite: POSITIVE — AB
Protein, ur: NEGATIVE mg/dL
Specific Gravity, Urine: 1.008 (ref 1.005–1.030)
pH: 5 (ref 5.0–8.0)

## 2023-01-29 LAB — MRSA NEXT GEN BY PCR, NASAL: MRSA by PCR Next Gen: NOT DETECTED

## 2023-01-29 MED ORDER — LEVOTHYROXINE SODIUM 50 MCG PO TABS
50.0000 ug | ORAL_TABLET | Freq: Every day | ORAL | Status: DC
  Administered 2023-01-29 – 2023-02-02 (×5): 50 ug via ORAL
  Filled 2023-01-29 (×5): qty 1

## 2023-01-29 MED ORDER — ONDANSETRON HCL 4 MG/2ML IJ SOLN: 4.0000 mg | Freq: Four times a day (QID) | INTRAMUSCULAR | Status: DC | PRN

## 2023-01-29 MED ORDER — HALOPERIDOL LACTATE 5 MG/ML IJ SOLN
1.0000 mg | Freq: Four times a day (QID) | INTRAMUSCULAR | Status: DC | PRN
  Administered 2023-01-30 – 2023-02-01 (×4): 1 mg via INTRAVENOUS
  Filled 2023-01-29 (×5): qty 1

## 2023-01-29 MED ORDER — MIRTAZAPINE 15 MG PO TABS
7.5000 mg | ORAL_TABLET | Freq: Every day | ORAL | Status: DC
  Administered 2023-01-29 – 2023-02-01 (×4): 7.5 mg via ORAL
  Filled 2023-01-29 (×4): qty 1

## 2023-01-29 MED ORDER — LORAZEPAM 2 MG/ML IJ SOLN
0.5000 mg | Freq: Once | INTRAMUSCULAR | Status: AC
  Administered 2023-01-29: 0.5 mg via INTRAVENOUS
  Filled 2023-01-29: qty 1

## 2023-01-29 MED ORDER — SODIUM CHLORIDE 0.9 % IV SOLN: 1.0000 g | INTRAVENOUS | Status: DC

## 2023-01-29 MED ORDER — POLYETHYLENE GLYCOL 3350 17 G PO PACK
17.0000 g | PACK | Freq: Every day | ORAL | Status: DC
  Administered 2023-01-29 – 2023-01-30 (×2): 17 g via ORAL
  Filled 2023-01-29 (×2): qty 1

## 2023-01-29 MED ORDER — CLOPIDOGREL BISULFATE 75 MG PO TABS
75.0000 mg | ORAL_TABLET | Freq: Every day | ORAL | Status: DC
  Administered 2023-01-29 – 2023-02-02 (×5): 75 mg via ORAL
  Filled 2023-01-29 (×5): qty 1

## 2023-01-29 MED ORDER — ALBUTEROL SULFATE (2.5 MG/3ML) 0.083% IN NEBU: 2.5000 mg | INHALATION_SOLUTION | RESPIRATORY_TRACT | Status: DC | PRN

## 2023-01-29 MED ORDER — SACUBITRIL-VALSARTAN 24-26 MG PO TABS
1.0000 | ORAL_TABLET | Freq: Two times a day (BID) | ORAL | Status: DC
  Administered 2023-01-29 – 2023-02-02 (×8): 1 via ORAL
  Filled 2023-01-29 (×10): qty 1

## 2023-01-29 MED ORDER — LORAZEPAM 0.5 MG PO TABS
0.5000 mg | ORAL_TABLET | Freq: Three times a day (TID) | ORAL | Status: DC | PRN
  Administered 2023-01-29 – 2023-01-31 (×7): 0.5 mg via ORAL
  Filled 2023-01-29 (×7): qty 1

## 2023-01-29 MED ORDER — ONDANSETRON HCL 4 MG PO TABS: 4.0000 mg | ORAL_TABLET | Freq: Four times a day (QID) | ORAL | Status: DC | PRN

## 2023-01-29 MED ORDER — ACETAMINOPHEN 650 MG RE SUPP: 650.0000 mg | Freq: Four times a day (QID) | RECTAL | Status: DC | PRN

## 2023-01-29 MED ORDER — EZETIMIBE 10 MG PO TABS
10.0000 mg | ORAL_TABLET | Freq: Every day | ORAL | Status: DC
  Administered 2023-01-29 – 2023-02-02 (×5): 10 mg via ORAL
  Filled 2023-01-29 (×5): qty 1

## 2023-01-29 MED ORDER — ENOXAPARIN SODIUM 40 MG/0.4ML IJ SOSY
40.0000 mg | PREFILLED_SYRINGE | INTRAMUSCULAR | Status: DC
  Administered 2023-01-29 – 2023-02-02 (×5): 40 mg via SUBCUTANEOUS
  Filled 2023-01-29 (×5): qty 0.4

## 2023-01-29 MED ORDER — OXYCODONE HCL 5 MG PO TABS
2.5000 mg | ORAL_TABLET | ORAL | Status: DC | PRN
  Administered 2023-01-29 – 2023-02-02 (×9): 5 mg via ORAL
  Filled 2023-01-29 (×9): qty 1

## 2023-01-29 MED ORDER — SODIUM CHLORIDE 0.9 % IV SOLN
1.0000 g | Freq: Once | INTRAVENOUS | Status: AC
  Administered 2023-01-29: 1 g via INTRAVENOUS
  Filled 2023-01-29: qty 10

## 2023-01-29 MED ORDER — ACETAMINOPHEN 325 MG PO TABS
650.0000 mg | ORAL_TABLET | Freq: Four times a day (QID) | ORAL | Status: DC | PRN
  Administered 2023-01-29 – 2023-02-02 (×4): 650 mg via ORAL
  Filled 2023-01-29 (×4): qty 2

## 2023-01-29 MED ORDER — ATORVASTATIN CALCIUM 40 MG PO TABS
80.0000 mg | ORAL_TABLET | Freq: Every day | ORAL | Status: DC
  Administered 2023-01-29 – 2023-02-02 (×5): 80 mg via ORAL
  Filled 2023-01-29 (×5): qty 2

## 2023-01-29 MED ORDER — CHLORHEXIDINE GLUCONATE CLOTH 2 % EX PADS
6.0000 | MEDICATED_PAD | Freq: Every day | CUTANEOUS | Status: DC
  Administered 2023-01-29 – 2023-02-02 (×5): 6 via TOPICAL

## 2023-01-29 MED ORDER — SODIUM CHLORIDE 0.9 % IV SOLN
1.0000 g | INTRAVENOUS | Status: AC
  Administered 2023-01-30 – 2023-01-31 (×2): 1 g via INTRAVENOUS
  Filled 2023-01-29 (×2): qty 10

## 2023-01-29 MED ORDER — DOCUSATE SODIUM 100 MG PO CAPS
100.0000 mg | ORAL_CAPSULE | Freq: Two times a day (BID) | ORAL | Status: DC
  Administered 2023-01-29 – 2023-01-30 (×2): 100 mg via ORAL
  Filled 2023-01-29 (×3): qty 1

## 2023-01-29 MED ORDER — SERTRALINE HCL 50 MG PO TABS
50.0000 mg | ORAL_TABLET | Freq: Every day | ORAL | Status: DC
  Administered 2023-01-29 – 2023-02-02 (×5): 50 mg via ORAL
  Filled 2023-01-29 (×5): qty 1

## 2023-01-29 MED ORDER — DOCUSATE SODIUM 100 MG PO CAPS
100.0000 mg | ORAL_CAPSULE | Freq: Two times a day (BID) | ORAL | Status: DC
Start: 2023-01-29 — End: 2023-01-29

## 2023-01-29 MED ORDER — CARVEDILOL 3.125 MG PO TABS
3.1250 mg | ORAL_TABLET | Freq: Two times a day (BID) | ORAL | Status: DC
  Administered 2023-01-29 – 2023-02-02 (×7): 3.125 mg via ORAL
  Filled 2023-01-29 (×10): qty 1

## 2023-01-29 NOTE — H&P (Signed)
History and Physical  Tamara Bullock ZOX:[id number] DOB: 27-Oct-1940 DOA: 01/28/2023  PCP: Vivien Presto, MD   Chief Complaint: abd pain   HPI: Tamara Bullock is a 82 y.o. female with medical history significant of CKD 3A, OSA, hypertension, hyperlipidemia, hypothyroidism, depression, stroke, CHF who had ORIF for right hip fracture on 01/14/23 and now presents from nursing home with complaints of lower abd pain found to have UTI.  History is provided by the patient's daughter who is at the bedside, says that she has been complaining of abdominal pain since admission to the nursing home, and has not been able to do any rehab.  She was at [facility], ER the other day, workup revealed stercoral colitis.  Due to multiple allergies, no antibiotics were given.  Today she returns, was found to have evidence of UTI, given a dose of IV Rocephin.  Daughter denies any nausea, vomiting, fevers, chills.  No complaints of chest pain.  Review of Systems: Please see HPI for pertinent positives and negatives. A complete 10 system review of systems are otherwise negative.  Past Medical History:  Diagnosis Date   Chronic combined systolic and diastolic CHF (congestive heart failure) (HCC)    Chronic kidney disease (CKD), stage III (moderate) (HCC)    Essential hypertension    Hyperlipidemia    Hypothyroidism    Stroke [facility])    Past Surgical History:  Procedure Laterality Date   ABDOMINAL HYSTERECTOMY     BACK SURGERY     INTRAMEDULLARY (IM) NAIL INTERTROCHANTERIC Right 01/14/2023   Procedure: INTRAMEDULLARY (IM) NAIL INTERTROCHANTERIC;  Surgeon: Myrene Galas, MD;  Location: [facility] OR;  Service: Orthopedics;  Laterality: Right;  Biomet    Social History:  reports that she has never smoked. She has never been exposed to tobacco smoke. She has never used smokeless tobacco. She reports that she does not currently use alcohol. She reports that she does not use drugs.   Allergies  Allergen Reactions    Diphenhydramine Hcl Shortness Of Breath    Dermatologicals-iv administration was painful until diluted; can take liquid in small doses by mouth.   Moxifloxacin Hcl In Nacl     Other reaction(s): Other (see comments) Fluoroquinolones-short term stomach and chest pain   Penicillin G Hives    Cephalosporin tolerant. Received ancef 2019 during back surgery    Sulfa Antibiotics Hives   Colistin     Other reaction(s): Other (see comments)   Diclofenac Sodium     Other reaction(s): Other Analgesics-Anti-inflammatory   Esomeprazole     Other reaction(s): Other (see comments)   Estradiol     Other reaction(s): Other (see comments)   Medroxyprogesterone Acetate     Other reaction(s): Other (see comments) Contraceptives   Neomycin-Polymyxin-Hc     Other reaction(s): Other Eye drops   Nitrofurantoin     Other reaction(s): Other Anti-infectives   Nsaids     Other reaction(s): Other (see comments) Can't tolerate OTC anit-inflammatories-can tolerate advil for a short periods   Prednisone     Other reaction(s): Other (see comments) Eye drop   Proparacaine     Other reaction(s): Other (see comments) Used at eye doctor   Rabeprazole     Other reaction(s): Other (see comments) Ulcer drugs-stomach/chest pain, pasty yellow stools   Amitriptyline     Antidepressants   Aspirin     unknown   Azithromycin     unknown   Carbocaine [Mepivacaine]     unknown   Cimetidine  unknown   Ciprofloxacin     unknown   Cisapride     unknown   Clindamycin/Lincomycin     unknown   Cortisporin-Tc [Neomycin-Colist-Hc-Thonzonium]     unknown   Escitalopram     unknown   Hydrochlorothiazide     unknown   Hyoscyamine     Ulcer drugs   Ibuprofen     unknown   Iodine     unknown   Medroxyprogesterone     unknown   Meloxicam    Minocycline     Tetracyclines   Moxifloxacin     Chest pain    Nitrofuran Derivatives     unknown   Ofloxacin     unknown   Pantoprazole     unknown    Penicillins     unknown   Pneumococcal Vaccines    Prednisolone     unknown   Prevacid [Lansoprazole]    Propoxyphene     -N 100 -Analgesics-opiod   Soap     unknown   Spironolactone     unknown   Tobradex [Tobramycin-Dexamethasone]     unknown   Trolamine Salicylate     unknown   Verapamil     unknown   Voltaren [Diclofenac]     unknown   Diphenhydramine     Other reaction(s): Other (see comments)   Hydrocodone     Other reaction(s): Other (see comments)   Tramadol     Other reaction(s): Other (see comments)    Family History  Problem Relation Age of Onset   Stroke Mother    Heart attack Father      Prior to Admission medications   Medication Sig Start Date End Date Taking? Authorizing Provider  acetaminophen (TYLENOL) 325 MG tablet Take 2 tablets (650 mg total) by mouth every 6 (six) hours as needed. Patient taking differently: Take 650 mg by mouth every 6 (six) hours as needed for moderate pain. 01/18/23  Yes Montez Morita, PA-C  atorvastatin (LIPITOR) 80 MG tablet Take 1 tablet (80 mg total) by mouth daily. Patient taking differently: Take 80 mg by mouth in the morning. 03/15/22  Yes Love, Evlyn Kanner, PA-C  carvedilol (COREG) 3.125 MG tablet Take 1 tablet (3.125 mg total) by mouth 2 (two) times daily with a meal. 12/25/22  Yes Branch, Alben Spittle, MD  Cholecalciferol (VITAMIN D3) 50 MCG (2000 UT) capsule Take 2,000 Units by mouth daily.   Yes [provider]  clopidogrel (PLAVIX) 75 MG tablet Take 1 tablet (75 mg total) by mouth daily. 06/21/22  Yes Marjie Skiff E, PA-C  cyanocobalamin (VITAMIN B12) 1000 MCG/ML injection Inject 1,000 mcg into the muscle daily. 01/27/23  Yes [provider]  docusate sodium (COLACE) 100 MG capsule Take 1 capsule (100 mg total) by mouth 2 (two) times daily. 01/18/23  Yes Glade Lloyd, MD  ENTRESTO 24-26 MG Take 1 tablet by mouth 2 (two) times daily. 01/22/23  Yes [provider]  ezetimibe (ZETIA) 10 MG tablet Take 1  tablet (10 mg total) by mouth daily. 11/07/22  Yes Marjie Skiff E, PA-C  LORazepam (ATIVAN) 0.5 MG tablet Take 1 tablet (0.5 mg total) by mouth daily as needed for anxiety. Patient taking differently: Take 0.5 mg by mouth every 8 (eight) hours as needed for anxiety. 01/18/23  Yes Glade Lloyd, MD  methocarbamol (ROBAXIN) 500 MG tablet Take 1 tablet (500 mg total) by mouth every 6 (six) hours as needed for muscle spasms. 01/18/23  Yes Glade Lloyd, MD  mirtazapine (REMERON) 7.5 MG tablet Take 1 tablet (7.5 mg total) by mouth at bedtime. 03/15/22  Yes Love, Evlyn Kanner, PA-C  ondansetron (ZOFRAN-ODT) 4 MG disintegrating tablet Take 4 mg by mouth every 6 (six) hours as needed for vomiting or nausea. 01/21/23  Yes [provider]  oxyCODONE (OXY IR/ROXICODONE) 5 MG immediate release tablet Take 0.5-1 tablets (2.5-5 mg total) by mouth every 4 (four) hours as needed for moderate pain or severe pain. 01/18/23  Yes Montez Morita, PA-C  sertraline (ZOLOFT) 50 MG tablet Take 50 mg by mouth daily.   Yes [provider]  torsemide (DEMADEX) 10 MG tablet Take 10 mg by mouth daily.   Yes [provider]  EUTHYROX 50 MCG tablet Take 50 mcg by mouth daily. 02/19/22   [provider]  polyethylene glycol (MIRALAX / GLYCOLAX) 17 g packet Take 17 g by mouth daily as needed for mild constipation. Patient taking differently: Take 17 g by mouth daily. 01/18/23   Glade Lloyd, MD    Physical Exam: BP (!) 111/44 (BP Location: Left Arm)   Pulse (!) 55   Temp 98 F (36.7 C) (Oral)   Resp (!) 33   Ht 5\' 3"  (1.6 m)   Wt 57.8 kg   SpO2 98%   BMI 22.57 kg/m   General: Thin elderly female lying on the stretcher in the ER asleep, daughter at the bedside. Eyes: EOMI, clear conjuctivae, white sclerea Neck: supple, no masses, trachea mildline  Cardiovascular: RRR, no murmurs or rubs, no peripheral edema  Respiratory: clear to auscultation bilaterally, no wheezes, no crackles  Abdomen:  soft, nontender, nondistended, normal bowel tones heard  Skin: dry, no rashes  Musculoskeletal: no joint effusions, normal range of motion  Psychiatric: appropriate affect, normal speech  Neurologic: extraocular muscles intact, clear speech, moving all extremities with intact sensorium          Labs on Admission:  Basic Metabolic Panel: Recent Labs  Lab 01/23/23 1118 01/23/23 1215 01/28/23 2327  NA 135 137 134*  K 3.5 3.7 4.0  CL 106 107 107  CO2 20*  --  21*  GLUCOSE 110* 102* 106*  BUN 17 17 15   CREATININE 0.93 0.90 0.94  CALCIUM 8.6*  --  8.1*   Liver Function Tests: Recent Labs  Lab 01/23/23 1118 01/28/23 2327  AST 60* 28  ALT 64* 30  ALKPHOS 72 114  BILITOT 1.4* 1.1  PROT 4.9* 5.4*  ALBUMIN 2.3* 2.5*   Recent Labs  Lab 01/23/23 1118 01/28/23 2327  LIPASE 28 30   No results for input(s): "AMMONIA" in the last 168 hours. CBC: Recent Labs  Lab 01/23/23 1118 01/23/23 1215 01/28/23 2327  WBC 10.0  --  6.3  NEUTROABS 8.0*  --  4.1  HGB 7.6* 7.8* 7.8*  HCT 23.7* 23.0* 24.5*  MCV 92.2  --  92.5  PLT 362  --  442*   Cardiac Enzymes: No results for input(s): "CKTOTAL", "CKMB", "CKMBINDEX", "TROPONINI" in the last 168 hours.  BNP (last 3 results) No results for input(s): "BNP" in the last 8760 hours.  ProBNP (last 3 results) Recent Labs    04/09/22 1654  PROBNP 1,208*    CBG: No results for input(s): "GLUCAP" in the last 168 hours.  Radiological Exams on Admission: CT ABDOMEN PELVIS WO CONTRAST  Result Date: 01/29/2023 CLINICAL DATA:  History of recent colitis with persistent abdominal pain, initial encounter EXAM: CT ABDOMEN AND PELVIS WITHOUT CONTRAST TECHNIQUE: Multidetector CT imaging of the  abdomen and pelvis was performed following the standard protocol without IV contrast. RADIATION DOSE REDUCTION: This exam was performed according to the departmental dose-optimization program which includes automated exposure control, adjustment of the mA  and/or kV according to patient size and/or use of iterative reconstruction technique. COMPARISON:  01/23/2023 FINDINGS: Lower chest: Small pleural effusions are again identified. Bibasilar atelectatic changes are noted. Hepatobiliary: No focal liver abnormality is seen. No gallstones, gallbladder wall thickening, or biliary dilatation. Pancreas: Unremarkable. No pancreatic ductal dilatation or surrounding inflammatory changes. Spleen: Normal in size without focal abnormality. Adrenals/Urinary Tract: Adrenal glands are within normal limits. Kidneys show no renal calculi or obstructive changes. Mild perinephric stranding is again noted. The bladder is decompressed by Foley catheter. Stomach/Bowel: The previously seen perirectal inflammatory changes have improved in the interval from the prior exam. Considerable decrease in presacral soft tissue edema is noted. No obstructive or inflammatory changes of the colon are seen. Scattered fecal material is noted. The appendix is within normal limits. Small bowel and stomach are unremarkable. Vascular/Lymphatic: Aortic atherosclerosis. No enlarged abdominal or pelvic lymph nodes. Reproductive: Status post hysterectomy. No adnexal masses. Other: No abdominal wall hernia or abnormality. No abdominopelvic ascites. Musculoskeletal: Postsurgical changes in the proximal right femur are noted. No acute bony abnormality is seen. Chronic T12 compression fracture is seen. IMPRESSION: Small bilateral pleural effusions. Interval improvement in the degree of proctitis when compared with the prior study. Some residual inflammatory changes noted. Postsurgical changes in the proximal right femur. Associated edema in the surrounding soft tissues is noted. Electronically Signed   By: Alcide Clever M.D.   On: 01/29/2023 01:57   DG Chest Portable 1 View  Result Date: 01/29/2023 CLINICAL DATA:  Weakness EXAM: PORTABLE CHEST 1 VIEW COMPARISON:  01/13/2023 FINDINGS: Cardiac shadow is within  normal limits. Aortic calcifications are noted. Lungs are well aerated bilaterally without focal infiltrate. Mild left basilar atelectasis is noted. IMPRESSION: Mild left basilar atelectasis is noted. Electronically Signed   By: Alcide Clever M.D.   On: 01/29/2023 00:55    Assessment/Plan 82 year old female with history of hyperlipidemia, CVA on Plavix, hypothyroidism, stage III CKD, essential hypertension and recent hospital stay for right hip ORIF after accidental fall and fracture being admitted to the hospital with weakness and lower abdominal pain found to have UTI. -Inpatient admission -Follow urine culture -Continue empiric IV Rocephin -PT/OT -Tylenol or oxycodone as needed pain  Home medications continued for the following chronic conditions   Essential hypertension   Stage 3a chronic kidney disease (CKD) (HCC)-renal function at baseline   Depression   Hypothyroidism   Chronic combined systolic and diastolic CHF (congestive heart failure) (HCC)   History of stroke-continue Plavix   Hyperlipidemia  DVT prophylaxis: Lovenox     Code Status: Full Code  Consults called: None  Admission status: Inpatient   Time spent: 43 minutes  Rene Gonsoulin Sharlette Dense MD [facility] Pager [phone]  If 7PM-7AM, please contact night-coverage [email or web address] Password [facility]  01/29/2023, 7:18 AM

## 2023-01-29 NOTE — Evaluation (Signed)
Clinical/Bedside Swallow Evaluation Patient Details  Name: Tamara Bullock MRN: [id number] Date of Birth: 05/02/1941  Today's Date: 01/29/2023 Time: SLP Start Time (ACUTE ONLY): 1239 SLP Stop Time (ACUTE ONLY): 1257 SLP Time Calculation (min) (ACUTE ONLY): 18 min  Past Medical History:  Past Medical History:  Diagnosis Date   Chronic combined systolic and diastolic CHF (congestive heart failure) (HCC)    Chronic kidney disease (CKD), stage III (moderate) (HCC)    Essential hypertension    Hyperlipidemia    Hypothyroidism    Stroke [facility])    Past Surgical History:  Past Surgical History:  Procedure Laterality Date   ABDOMINAL HYSTERECTOMY     BACK SURGERY     INTRAMEDULLARY (IM) NAIL INTERTROCHANTERIC Right 01/14/2023   Procedure: INTRAMEDULLARY (IM) NAIL INTERTROCHANTERIC;  Surgeon: Myrene Galas, MD;  Location: [facility] OR;  Service: Orthopedics;  Laterality: Right;  Biomet   HPI:  Tamara Bullock is a 82 y.o. female who presents from nursing home with complaints of lower abd pain found to have UTI. Coughing with breakfast this am, prompting swallow eval orders. Prior medical history significant for CKD 3A, OSA, hypertension, hyperlipidemia, hypothyroidism, depression, stroke, CHF who had ORIF for right hip fracture on 01/14/23. Had clinical swallow evaluation 03/03/22 after left BG CVA; regular diet/thin liquids recommended.    Assessment / Plan / Recommendation  Clinical Impression  Tamara Bullock participated in clinical swallow exam that was limited due to abdominal pain.  Oral mechanism exam normal. Demonstrated adequate mastication, the appearance of a brisk swallow response, no s/s of aspiration or oral retention of POs.  Recommend continuing regular diet, thin liquids; give meds whole in puree. Assist with feeding. No SLP f/u needed. SLP Visit Diagnosis: Dysphagia, unspecified (R13.10)    Aspiration Risk  No limitations    Diet Recommendation   Regular solids, thin liquids  Medication  Administration: Whole meds with puree    Other  Recommendations Oral Care Recommendations: Oral care BID    Recommendations for follow up therapy are one component of a multi-disciplinary discharge planning process, led by the attending physician.  Recommendations may be updated based on patient status, additional functional criteria and insurance authorization.  Follow up Recommendations No SLP follow up        Swallow Study   General Date of Onset: 01/29/23 HPI: Tamara Bullock is a 82 y.o. female who presents from nursing home with complaints of lower abd pain found to have UTI. Prior medical history significant for CKD 3A, OSA, hypertension, hyperlipidemia, hypothyroidism, depression, stroke, CHF who had ORIF for right hip fracture on 01/14/23. Had clinical swallow evaluation 03/03/22 after left BG CVA; regular diet/thin liquids recommended. Type of Study: Bedside Swallow Evaluation Previous Swallow Assessment: see HPI Diet Prior to this Study: Regular;Thin liquids (Level 0) Temperature Spikes Noted: No Respiratory Status: Room air History of Recent Intubation: No Behavior/Cognition: Alert;Cooperative Oral Cavity Assessment: Within Functional Limits Oral Care Completed by SLP: No Oral Cavity - Dentition: Adequate natural dentition Vision: Functional for self-feeding Self-Feeding Abilities: Needs assist Patient Positioning: Upright in bed Baseline Vocal Quality: Normal Volitional Swallow: Unable to elicit    Oral/Motor/Sensory Function Overall Oral Motor/Sensory Function: Within functional limits   Ice Chips Ice chips: Within functional limits   Thin Liquid Thin Liquid: Within functional limits    Nectar Thick Nectar Thick Liquid: Not tested   Honey Thick Honey Thick Liquid: Not tested   Puree Puree: Within functional limits   Solid     Solid: Within functional  limits      Blenda Mounts Laurice 01/29/2023,1:03 PM  Marchelle Folks L. Samson Frederic, MA CCC/SLP Clinical Specialist -  Acute Care SLP Acute Rehabilitation Services Office number [phone]

## 2023-01-29 NOTE — Evaluation (Signed)
Physical Therapy Evaluation Patient Details Name: Tamara Bullock MRN: [id number] DOB: 01-12-1941 Today's Date: 01/29/2023  History of Present Illness  82 yo female admitted from SNF with UTI. Recent R hip IM nailing  01/14/23. Hx of fall, CKD, CVA, hypothyroidism, OSA  Clinical Impression  On eval, pt required Max A for bed mobility and standing with use of STEDY. Pt stood for ~20 seconds-poor posture-fatigues quickly. Pt stood x 1 from elevated bed then x 1 from STEDY seat. Assisted pt back to bed at her request. Attempted ROM exercises for R LE but pt quickly fell asleep so will defer until next session. Pt will benefit from continued rehab after hospital stay.        Recommendations for follow up therapy are one component of a multi-disciplinary discharge planning process, led by the attending physician.  Recommendations may be updated based on patient status, additional functional criteria and insurance authorization.  Follow Up Recommendations Can patient physically be transported by private vehicle: No     Assistance Recommended at Discharge Frequent or constant Supervision/Assistance  Patient can return home with the following  Two people to help with walking and/or transfers;Assistance with cooking/housework;Assist for transportation;Help with stairs or ramp for entrance;Direct supervision/assist for medications management;Direct supervision/assist for financial management;A lot of help with bathing/dressing/bathroom    Equipment Recommendations None recommended by PT  Recommendations for Other Services       Functional Status Assessment Patient has had a recent decline in their functional status and demonstrates the ability to make significant improvements in function in a reasonable and predictable amount of time.     Precautions / Restrictions Precautions Precautions: Fall Restrictions Weight Bearing Restrictions: No RLE Weight Bearing: Weight bearing as tolerated       Mobility  Bed Mobility Overal bed mobility: Needs Assistance Bed Mobility: Supine to Sit, Sit to Supine     Supine to sit: HOB elevated, Max assist Sit to supine: HOB elevated, Max assist   General bed mobility comments: Assist for trunk and bil LEs. Utilized bedpad for scooting, positioning at EOB.    Transfers Overall transfer level: Needs assistance   Transfers: Sit to/from Stand Sit to Stand: Max assist, From elevated surface           General transfer comment: Max assist for boost to stand. Poor flexed posture. Able to WB bilaterally. Fatigues easily/quickly. Max cues for safety, posture. Facilitation at sternum and buttocks to encourage full extension. Transfer via Lift Equipment: Stedy  Ambulation/Gait                  Stairs            Wheelchair Mobility    Modified Rankin (Stroke Patients Only)       Balance Overall balance assessment: Needs assistance Sitting-balance support: Bilateral upper extremity supported, Feet supported Sitting balance-Leahy Scale: Poor Sitting balance - Comments: Forward lean.   Standing balance support: Bilateral upper extremity supported, Reliant on assistive device for balance Standing balance-Leahy Scale: Poor                               Pertinent Vitals/Pain Pain Assessment Pain Assessment: Faces Faces Pain Scale: Hurts even more Pain Location: R hip, abdomen Pain Descriptors / Indicators: Guarding, Grimacing Pain Intervention(s): Limited activity within patient's tolerance, Monitored during session, Repositioned    Home Living Family/patient expects to be discharged to:: Skilled nursing facility Living Arrangements: Spouse/significant other Available  Help at Discharge: Family;Available PRN/intermittently Type of Home: Independent living facility ([facility]) Home Access: Level entry       Home Layout: One level Home Equipment: Rollator (4 wheels);Cane - single  point;Crutches;Shower seat      Prior Function Prior Level of Function : Needs assist;History of Falls (last six months)             Mobility Comments: Rollator PRN but rare per family       Hand Dominance   Dominant Hand: Right    Extremity/Trunk Assessment   Upper Extremity Assessment Upper Extremity Assessment: Defer to OT evaluation    Lower Extremity Assessment Lower Extremity Assessment: Generalized weakness RLE Deficits / Details: R LE edematous. Able to weightbear briefly.       Communication   Communication: HOH;Expressive difficulties (since CVA last June)  Cognition Arousal/Alertness: Awake/alert Behavior During Therapy: WFL for tasks assessed/performed Overall Cognitive Status: Difficult to assess Area of Impairment: Following commands, Safety/judgement, Memory, Problem solving                     Memory: Decreased short-term memory Following Commands: Follows one step commands consistently Safety/Judgement: Decreased awareness of safety, Decreased awareness of deficits   Problem Solving: Requires verbal cues General Comments: Expressive difficulties. A bit impulsive.        General Comments      Exercises     Assessment/Plan    PT Assessment Patient needs continued PT services  PT Problem List Decreased strength;Decreased range of motion;Decreased balance;Decreased activity tolerance;Decreased mobility;Decreased knowledge of use of DME;Decreased knowledge of precautions;Pain       PT Treatment Interventions DME instruction;Gait training;Functional mobility training;Therapeutic activities;Therapeutic exercise;Balance training;Neuromuscular re-education;Patient/family education;Wheelchair mobility training;Cognitive remediation;Modalities    PT Goals (Current goals can be found in the Care Plan section)  Acute Rehab PT Goals Patient Stated Goal: none PT Goal Formulation: With family Time For Goal Achievement: 02/12/23 Potential to  Achieve Goals: Good    Frequency Min 1X/week     Co-evaluation               AM-PAC PT "6 Clicks" Mobility  Outcome Measure Help needed turning from your back to your side while in a flat bed without using bedrails?: A Lot Help needed moving from lying on your back to sitting on the side of a flat bed without using bedrails?: A Lot Help needed moving to and from a bed to a chair (including a wheelchair)?: A Lot Help needed standing up from a chair using your arms (e.g., wheelchair or bedside chair)?: A Lot Help needed to walk in hospital room?: Total Help needed climbing 3-5 steps with a railing? : Total 6 Click Score: 10    End of Session Equipment Utilized During Treatment: Gait belt Activity Tolerance: Patient limited by fatigue Patient left: in bed;with call bell/phone within reach;with bed alarm set   PT Visit Diagnosis: Muscle weakness (generalized) (M62.81);History of falling (Z91.81);Difficulty in walking, not elsewhere classified (R26.2);Pain Pain - part of body: Hip (abdomen)    Time: 5409-8119 PT Time Calculation (min) (ACUTE ONLY): 25 min   Charges:   PT Evaluation $PT Eval Low Complexity: 1 Low PT Treatments $Therapeutic Activity: 8-22 mins           Faye Ramsay, PT Acute Rehabilitation  Office: [phone]

## 2023-01-29 NOTE — ED Notes (Signed)
ED TO INPATIENT HANDOFF REPORT  ED Nurse Name and Phone #: Lona Kettle Name/Age/Gender Tamara Bullock 82 y.o. female Room/Bed: WA14/WA14  Code Status   Code Status: Full Code  Home/SNF/Other Nursing Home Patient oriented to: self and place Is this baseline? Yes   Triage Complete: Triage complete  Chief Complaint Sepsis due to urinary tract infection (HCC) [A41.9, N39.0]  Triage Note BIBA from [facility] for lethargy, surgery on 4/15 for Rt femur Fx and since then she has not returned back to baseline. Discharged from [facility] yesterday for Colitis and daughter wants that evaluated also. Was taken off blood thinners for surgery and has gained 20 lbs   Allergies Allergies  Allergen Reactions   Diphenhydramine Hcl Shortness Of Breath    Dermatologicals-iv administration was painful until diluted; can take liquid in small doses by mouth.   Moxifloxacin Hcl In Nacl     Other reaction(s): Other (see comments) Fluoroquinolones-short term stomach and chest pain   Penicillin G Hives    Cephalosporin tolerant. Received ancef 2019 during back surgery    Sulfa Antibiotics Hives   Colistin     Other reaction(s): Other (see comments)   Diclofenac Sodium     Other reaction(s): Other Analgesics-Anti-inflammatory   Esomeprazole     Other reaction(s): Other (see comments)   Estradiol     Other reaction(s): Other (see comments)   Medroxyprogesterone Acetate     Other reaction(s): Other (see comments) Contraceptives   Neomycin-Polymyxin-Hc     Other reaction(s): Other Eye drops   Nitrofurantoin     Other reaction(s): Other Anti-infectives   Nsaids     Other reaction(s): Other (see comments) Can't tolerate OTC anit-inflammatories-can tolerate advil for a short periods   Prednisone     Other reaction(s): Other (see comments) Eye drop   Proparacaine     Other reaction(s): Other (see comments) Used at eye doctor   Rabeprazole     Other reaction(s): Other (see comments) Ulcer  drugs-stomach/chest pain, pasty yellow stools   Amitriptyline     Antidepressants   Aspirin     unknown   Azithromycin     unknown   Carbocaine [Mepivacaine]     unknown   Cimetidine     unknown   Ciprofloxacin     unknown   Cisapride     unknown   Clindamycin/Lincomycin     unknown   Cortisporin-Tc [Neomycin-Colist-Hc-Thonzonium]     unknown   Escitalopram     unknown   Hydrochlorothiazide     unknown   Hyoscyamine     Ulcer drugs   Ibuprofen     unknown   Iodine     unknown   Medroxyprogesterone     unknown   Meloxicam    Minocycline     Tetracyclines   Moxifloxacin     Chest pain    Nitrofuran Derivatives     unknown   Ofloxacin     unknown   Pantoprazole     unknown   Penicillins     unknown   Pneumococcal Vaccines    Prednisolone     unknown   Prevacid [Lansoprazole]    Propoxyphene     -N 100 -Analgesics-opiod   Soap     unknown   Spironolactone     unknown   Tobradex [Tobramycin-Dexamethasone]     unknown   Trolamine Salicylate     unknown   Verapamil     unknown   Voltaren [Diclofenac]  unknown   Diphenhydramine     Other reaction(s): Other (see comments)   Hydrocodone     Other reaction(s): Other (see comments)   Tramadol     Other reaction(s): Other (see comments)    Level of Care/Admitting Diagnosis ED Disposition     ED Disposition  Admit   Condition  --   Comment  Hospital Area: [facility] [[id number]]  Level of Care: Progressive [102]  Admit to Progressive based on following criteria: MULTISYSTEM THREATS such as stable sepsis, metabolic/electrolyte imbalance with or without encephalopathy that is responding to early treatment.  May admit patient to [facility] or [facility] if equivalent level of care is available:: Yes  Covid Evaluation: Asymptomatic - no recent exposure (last 10 days) testing not required  Diagnosis: Sepsis due to urinary tract infection [facility]) [[id number]]  Admitting Physician:  Arlean Hopping [[id number]]  Attending Physician: Cy Blamer [[id number]]  Certification:: I certify this patient will need inpatient services for at least 2 midnights  Estimated Length of Stay: 4          B Medical/Surgery History Past Medical History:  Diagnosis Date   Chronic combined systolic and diastolic CHF (congestive heart failure) (HCC)    Chronic kidney disease (CKD), stage III (moderate) (HCC)    Essential hypertension    Hyperlipidemia    Hypothyroidism    Stroke [facility])    Past Surgical History:  Procedure Laterality Date   ABDOMINAL HYSTERECTOMY     BACK SURGERY     INTRAMEDULLARY (IM) NAIL INTERTROCHANTERIC Right 01/14/2023   Procedure: INTRAMEDULLARY (IM) NAIL INTERTROCHANTERIC;  Surgeon: Myrene Galas, MD;  Location: [facility] OR;  Service: Orthopedics;  Laterality: Right;  Biomet     A IV Location/Drains/Wounds Patient Lines/Drains/Airways Status     Active Line/Drains/Airways     Name Placement date Placement time Site Days   Peripheral IV 01/28/23 20 G Right Antecubital 01/28/23  2258  Antecubital  1   Urethral Catheter Alexis RN Latex 14 Fr. 01/17/23  1005  Latex  12            Intake/Output Last 24 hours  Intake/Output Summary (Last 24 hours) at 01/29/2023 0719 Last data filed at 01/29/2023 0314 Gross per 24 hour  Intake 100 ml  Output --  Net 100 ml    Labs/Imaging Results for orders placed or performed during the hospital encounter of 01/28/23 (from the past 48 hour(s))  CBC with Differential     Status: Abnormal   Collection Time: 01/28/23 11:27 PM  Result Value Ref Range   WBC 6.3 4.0 - 10.5 K/uL   RBC 2.65 (L) 3.87 - 5.11 MIL/uL   Hemoglobin 7.8 (L) 12.0 - 15.0 g/dL   HCT 11.9 (L) 14.7 - 82.9 %   MCV 92.5 80.0 - 100.0 fL   MCH 29.4 26.0 - 34.0 pg   MCHC 31.8 30.0 - 36.0 g/dL   RDW 56.2 13.0 - 86.5 %   Platelets 442 (H) 150 - 400 K/uL   nRBC 0.0 0.0 - 0.2 %   Neutrophils Relative % 64 %   Neutro Abs 4.1 1.7 - 7.7 K/uL    Lymphocytes Relative 18 %   Lymphs Abs 1.2 0.7 - 4.0 K/uL   Monocytes Relative 12 %   Monocytes Absolute 0.7 0.1 - 1.0 K/uL   Eosinophils Relative 4 %   Eosinophils Absolute 0.2 0.0 - 0.5 K/uL   Basophils Relative 1 %   Basophils Absolute 0.1 0.0 - 0.1 K/uL  Immature Granulocytes 1 %   Abs Immature Granulocytes 0.04 0.00 - 0.07 K/uL    Comment: Performed at [facility], [address], [place], [place] [address]  Comprehensive metabolic panel     Status: Abnormal   Collection Time: 01/28/23 11:27 PM  Result Value Ref Range   Sodium 134 (L) 135 - 145 mmol/L   Potassium 4.0 3.5 - 5.1 mmol/L   Chloride 107 98 - 111 mmol/L   CO2 21 (L) 22 - 32 mmol/L   Glucose, Bld 106 (H) 70 - 99 mg/dL    Comment: Glucose reference range applies only to samples taken after fasting for at least 8 hours.   BUN 15 8 - 23 mg/dL   Creatinine, Ser 6.04 0.44 - 1.00 mg/dL   Calcium 8.1 (L) 8.9 - 10.3 mg/dL   Total Protein 5.4 (L) 6.5 - 8.1 g/dL   Albumin 2.5 (L) 3.5 - 5.0 g/dL   AST 28 15 - 41 U/L   ALT 30 0 - 44 U/L   Alkaline Phosphatase 114 38 - 126 U/L   Total Bilirubin 1.1 0.3 - 1.2 mg/dL   GFR, Estimated >54 >09 mL/min    Comment: (NOTE) Calculated using the CKD-EPI Creatinine Equation (2021)    Anion gap 6 5 - 15    Comment: Performed at [facility], [address], [place], [place] [address]  Lipase, blood     Status: None   Collection Time: 01/28/23 11:27 PM  Result Value Ref Range   Lipase 30 11 - 51 U/L    Comment: Performed at [facility], [address] [place], [place] [address]  Urinalysis, Routine w reflex microscopic -Urine, Clean Catch     Status: Abnormal   Collection Time: 01/29/23 12:30 AM  Result Value Ref Range   Color, Urine YELLOW YELLOW   APPearance HAZY (A) CLEAR   Specific Gravity, Urine 1.008 1.005 - 1.030   pH 5.0 5.0 - 8.0   Glucose, UA NEGATIVE NEGATIVE mg/dL   Hgb urine dipstick SMALL (A) NEGATIVE    Bilirubin Urine NEGATIVE NEGATIVE   Ketones, ur NEGATIVE NEGATIVE mg/dL   Protein, ur NEGATIVE NEGATIVE mg/dL   Nitrite POSITIVE (A) NEGATIVE   Leukocytes,Ua LARGE (A) NEGATIVE   RBC / HPF 0-5 0 - 5 RBC/hpf   WBC, UA 21-50 0 - 5 WBC/hpf   Bacteria, UA RARE (A) NONE SEEN   Squamous Epithelial / HPF 0-5 0 - 5 /HPF   Mucus PRESENT     Comment: Performed at [facility], [address] [place], [place] [address]   CT ABDOMEN PELVIS WO CONTRAST  Result Date: 01/29/2023 CLINICAL DATA:  History of recent colitis with persistent abdominal pain, initial encounter EXAM: CT ABDOMEN AND PELVIS WITHOUT CONTRAST TECHNIQUE: Multidetector CT imaging of the abdomen and pelvis was performed following the standard protocol without IV contrast. RADIATION DOSE REDUCTION: This exam was performed according to the departmental dose-optimization program which includes automated exposure control, adjustment of the mA and/or kV according to patient size and/or use of iterative reconstruction technique. COMPARISON:  01/23/2023 FINDINGS: Lower chest: Small pleural effusions are again identified. Bibasilar atelectatic changes are noted. Hepatobiliary: No focal liver abnormality is seen. No gallstones, gallbladder wall thickening, or biliary dilatation. Pancreas: Unremarkable. No pancreatic ductal dilatation or surrounding inflammatory changes. Spleen: Normal in size without focal abnormality. Adrenals/Urinary Tract: Adrenal glands are within normal limits. Kidneys show no renal calculi or obstructive changes. Mild perinephric stranding  is again noted. The bladder is decompressed by Foley catheter. Stomach/Bowel: The previously seen perirectal inflammatory changes have improved in the interval from the prior exam. Considerable decrease in presacral soft tissue edema is noted. No obstructive or inflammatory changes of the colon are seen. Scattered fecal material is noted. The appendix is within normal limits.  Small bowel and stomach are unremarkable. Vascular/Lymphatic: Aortic atherosclerosis. No enlarged abdominal or pelvic lymph nodes. Reproductive: Status post hysterectomy. No adnexal masses. Other: No abdominal wall hernia or abnormality. No abdominopelvic ascites. Musculoskeletal: Postsurgical changes in the proximal right femur are noted. No acute bony abnormality is seen. Chronic T12 compression fracture is seen. IMPRESSION: Small bilateral pleural effusions. Interval improvement in the degree of proctitis when compared with the prior study. Some residual inflammatory changes noted. Postsurgical changes in the proximal right femur. Associated edema in the surrounding soft tissues is noted. Electronically Signed   By: Alcide Clever M.D.   On: 01/29/2023 01:57   DG Chest Portable 1 View  Result Date: 01/29/2023 CLINICAL DATA:  Weakness EXAM: PORTABLE CHEST 1 VIEW COMPARISON:  01/13/2023 FINDINGS: Cardiac shadow is within normal limits. Aortic calcifications are noted. Lungs are well aerated bilaterally without focal infiltrate. Mild left basilar atelectasis is noted. IMPRESSION: Mild left basilar atelectasis is noted. Electronically Signed   By: Alcide Clever M.D.   On: 01/29/2023 00:55    Pending Labs Unresulted Labs (From admission, onward)     Start     Ordered   02/05/23 0500  Creatinine, serum  (enoxaparin (LOVENOX)    CrCl >/= 30 ml/min)  Weekly,   R     Comments: while on enoxaparin therapy    01/29/23 0717   01/30/23 0500  Basic metabolic panel  Tomorrow morning,   R        01/29/23 0717   01/30/23 0500  CBC  Tomorrow morning,   R        01/29/23 0717   01/29/23 0717  CBC  (enoxaparin (LOVENOX)    CrCl >/= 30 ml/min)  Once,   R       Comments: Baseline for enoxaparin therapy IF NOT ALREADY DRAWN.  Notify MD if PLT < 100 K.    01/29/23 0717   01/29/23 0717  Creatinine, serum  (enoxaparin (LOVENOX)    CrCl >/= 30 ml/min)  Once,   R       Comments: Baseline for enoxaparin therapy IF NOT  ALREADY DRAWN.    01/29/23 0717            Vitals/Pain Today's Vitals   01/29/23 0313 01/29/23 0334 01/29/23 0530 01/29/23 0615  BP:  136/76 (!) 104/35 (!) 111/44  Pulse:  (!) 59 (!) 57 (!) 55  Resp:  19 (!) 23 (!) 33  Temp: 99.1 F (37.3 C)   98 F (36.7 C)  TempSrc: Oral   Oral  SpO2:  100% 90% 98%  Weight:      Height:      PainSc:        Isolation Precautions No active isolations  Medications Medications  oxyCODONE (Oxy IR/ROXICODONE) immediate release tablet 2.5-5 mg (has no administration in time range)  atorvastatin (LIPITOR) tablet 80 mg (has no administration in time range)  carvedilol (COREG) tablet 3.125 mg (has no administration in time range)  sacubitril-valsartan (ENTRESTO) 24-26 mg per tablet (has no administration in time range)  ezetimibe (ZETIA) tablet 10 mg (has no administration in time range)  mirtazapine (REMERON) tablet 7.5 mg (has no  administration in time range)  LORazepam (ATIVAN) tablet 0.5 mg (has no administration in time range)  sertraline (ZOLOFT) tablet 50 mg (has no administration in time range)  levothyroxine (SYNTHROID) tablet 50 mcg (has no administration in time range)  docusate sodium (COLACE) capsule 100 mg (has no administration in time range)  polyethylene glycol (MIRALAX / GLYCOLAX) packet 17 g (has no administration in time range)  clopidogrel (PLAVIX) tablet 75 mg (has no administration in time range)  enoxaparin (LOVENOX) injection 40 mg (has no administration in time range)  acetaminophen (TYLENOL) tablet 650 mg (has no administration in time range)    Or  acetaminophen (TYLENOL) suppository 650 mg (has no administration in time range)  docusate sodium (COLACE) capsule 100 mg (has no administration in time range)  ondansetron (ZOFRAN) tablet 4 mg (has no administration in time range)    Or  ondansetron (ZOFRAN) injection 4 mg (has no administration in time range)  albuterol (PROVENTIL) (2.5 MG/3ML) 0.083% nebulizer  solution 2.5 mg (has no administration in time range)  cefTRIAXone (ROCEPHIN) 1 g in sodium chloride 0.9 % 100 mL IVPB (has no administration in time range)  cefTRIAXone (ROCEPHIN) 1 g in sodium chloride 0.9 % 100 mL IVPB (0 g Intravenous Stopped 01/29/23 0314)  LORazepam (ATIVAN) injection 0.5 mg (0.5 mg Intravenous Given 01/29/23 0409)    Mobility non-ambulatory     Focused Assessments Weakness post hip surgery   R Recommendations: See Admitting Provider Note  Report given to:   Additional Notes: .

## 2023-01-29 NOTE — Progress Notes (Signed)
01/29/2023  1844  Notified Ikramullah MD of patients BP 109/44.

## 2023-01-29 NOTE — ED Notes (Signed)
Pt's family will be returning in about 25 minutes and requests that the physician wait until they are back at bedside for further care plans.

## 2023-01-29 NOTE — Progress Notes (Signed)
All sutures removed from right hip per order. 3 sutures from top incision, 2 sutures from middle incision, 2 sutures from lower incision. All pieces were removed without complications- patient tolerated well. Incisions cleaned with saline/gauze. Gauze/foam dressings applied.

## 2023-01-29 NOTE — ED Provider Notes (Signed)
[place] EMERGENCY DEPARTMENT AT [facility] Provider Note   CSN: [id number] Arrival date & time: 01/28/23  2248     History  Chief Complaint  Patient presents with   Weakness    Tamara Bullock is a 82 y.o. female.  The history is provided by the patient, a relative, the EMS personnel and medical records. The history is limited by the condition of the patient.  Weakness Severity:  Moderate Onset quality:  Gradual Timing:  Constant Progression:  Worsening Chronicity:  New Context: not stress and not urinary tract infection   Relieved by:  Nothing Worsened by:  Nothing Ineffective treatments:  None tried Associated symptoms: abdominal pain   Associated symptoms: no fever and no vomiting   Risk factors: no diabetes   Patient with CHF and stroke and memory issues presents with global weakness.     Past Medical History:  Diagnosis Date   Chronic combined systolic and diastolic CHF (congestive heart failure) (HCC)    Chronic kidney disease (CKD), stage III (moderate) (HCC)    Essential hypertension    Hyperlipidemia    Hypothyroidism    Stroke [facility])      Home Medications Prior to Admission medications   Medication Sig Start Date End Date Taking? Authorizing Provider  acetaminophen (TYLENOL) 325 MG tablet Take 2 tablets (650 mg total) by mouth every 6 (six) hours as needed. 01/18/23   Montez Morita, PA-C  atorvastatin (LIPITOR) 80 MG tablet Take 1 tablet (80 mg total) by mouth daily. Patient taking differently: Take 80 mg by mouth every evening. 03/15/22   Love, Evlyn Kanner, PA-C  carvedilol (COREG) 3.125 MG tablet Take 1 tablet (3.125 mg total) by mouth 2 (two) times daily with a meal. 12/25/22   Maisie Fus, MD  clopidogrel (PLAVIX) 75 MG tablet Take 1 tablet (75 mg total) by mouth daily. 06/21/22   Marjie Skiff E, PA-C  docusate sodium (COLACE) 100 MG capsule Take 1 capsule (100 mg total) by mouth 2 (two) times daily. 01/18/23   Glade Lloyd, MD  EUTHYROX  50 MCG tablet Take 50 mcg by mouth daily. 02/19/22   [provider]  ezetimibe (ZETIA) 10 MG tablet Take 1 tablet (10 mg total) by mouth daily. 11/07/22   Marjie Skiff E, PA-C  LORazepam (ATIVAN) 0.5 MG tablet Take 1 tablet (0.5 mg total) by mouth daily as needed for anxiety. 01/18/23   Glade Lloyd, MD  methocarbamol (ROBAXIN) 500 MG tablet Take 1 tablet (500 mg total) by mouth every 6 (six) hours as needed for muscle spasms. 01/18/23   Glade Lloyd, MD  mirtazapine (REMERON) 7.5 MG tablet Take 1 tablet (7.5 mg total) by mouth at bedtime. 03/15/22   Love, Evlyn Kanner, PA-C  oxyCODONE (OXY IR/ROXICODONE) 5 MG immediate release tablet Take 0.5-1 tablets (2.5-5 mg total) by mouth every 4 (four) hours as needed for moderate pain or severe pain. 01/18/23   Montez Morita, PA-C  polyethylene glycol (MIRALAX / GLYCOLAX) 17 g packet Take 17 g by mouth daily as needed for mild constipation. 01/18/23   Glade Lloyd, MD  sertraline (ZOLOFT) 50 MG tablet Take 50 mg by mouth daily.    [provider]      Allergies    Diphenhydramine hcl, Moxifloxacin hcl in nacl, Penicillin g, Sulfa antibiotics, Colistin, Diclofenac sodium, Esomeprazole, Estradiol, Medroxyprogesterone acetate, Neomycin-polymyxin-hc, Nitrofurantoin, Nsaids, Prednisone, Proparacaine, Rabeprazole, Amitriptyline, Aspirin, Azithromycin, Carbocaine [mepivacaine], Cimetidine, Ciprofloxacin, Cisapride, Clindamycin/lincomycin, Cortisporin-tc [neomycin-colist-hc-thonzonium], Escitalopram, Hydrochlorothiazide, Hyoscyamine, Ibuprofen, Iodine, Medroxyprogesterone, Meloxicam, Minocycline,  Moxifloxacin, Nitrofuran derivatives, Ofloxacin, Pantoprazole, Penicillins, Pneumococcal vaccines, Prednisolone, Prevacid [lansoprazole], Propoxyphene, Soap, Spironolactone, Tobradex [tobramycin-dexamethasone], Trolamine salicylate, Verapamil, Voltaren [diclofenac], Diphenhydramine, Hydrocodone, and Tramadol    Review of Systems   Review of Systems   Constitutional:  Negative for fever.  HENT:  Negative for facial swelling.   Eyes:  Negative for photophobia.  Respiratory:  Negative for wheezing and stridor.   Gastrointestinal:  Positive for abdominal pain. Negative for vomiting.  Neurological:  Positive for weakness.    Physical Exam Updated Vital Signs BP (!) 140/52   Pulse (!) 54   Temp 99.4 F (37.4 C) (Oral)   Resp 19   Ht 5\' 3"  (1.6 m)   Wt 57.8 kg   SpO2 98%   BMI 22.57 kg/m  Physical Exam Vitals and nursing note reviewed.  Constitutional:      General: She is not in acute distress.    Appearance: Normal appearance. She is well-developed.  HENT:     Head: Normocephalic and atraumatic.     Nose: Nose normal.  Eyes:     Pupils: Pupils are equal, round, and reactive to light.  Cardiovascular:     Rate and Rhythm: Normal rate and regular rhythm.     Pulses: Normal pulses.     Heart sounds: Normal heart sounds.  Pulmonary:     Effort: Pulmonary effort is normal. No respiratory distress.     Breath sounds: Normal breath sounds.  Abdominal:     General: Bowel sounds are normal. There is no distension.     Palpations: Abdomen is soft.     Tenderness: There is no abdominal tenderness. There is no guarding or rebound.  Genitourinary:    Vagina: No vaginal discharge.  Musculoskeletal:        General: Normal range of motion.     Cervical back: Neck supple.  Skin:    General: Skin is warm and dry.     Capillary Refill: Capillary refill takes less than 2 seconds.     Findings: No erythema or rash.  Neurological:     General: No focal deficit present.     Mental Status: She is alert and oriented to person, place, and time.     Deep Tendon Reflexes: Reflexes normal.  Psychiatric:        Mood and Affect: Mood normal.        Behavior: Behavior normal.     ED Results / Procedures / Treatments   Labs (all labs ordered are listed, but only abnormal results are displayed) Results for orders placed or performed  during the hospital encounter of 01/28/23  CBC with Differential  Result Value Ref Range   WBC 6.3 4.0 - 10.5 K/uL   RBC 2.65 (L) 3.87 - 5.11 MIL/uL   Hemoglobin 7.8 (L) 12.0 - 15.0 g/dL   HCT 16.1 (L) 09.6 - 04.5 %   MCV 92.5 80.0 - 100.0 fL   MCH 29.4 26.0 - 34.0 pg   MCHC 31.8 30.0 - 36.0 g/dL   RDW 40.9 81.1 - 91.4 %   Platelets 442 (H) 150 - 400 K/uL   nRBC 0.0 0.0 - 0.2 %   Neutrophils Relative % 64 %   Neutro Abs 4.1 1.7 - 7.7 K/uL   Lymphocytes Relative 18 %   Lymphs Abs 1.2 0.7 - 4.0 K/uL   Monocytes Relative 12 %   Monocytes Absolute 0.7 0.1 - 1.0 K/uL   Eosinophils Relative 4 %   Eosinophils Absolute 0.2 0.0 - 0.5  K/uL   Basophils Relative 1 %   Basophils Absolute 0.1 0.0 - 0.1 K/uL   Immature Granulocytes 1 %   Abs Immature Granulocytes 0.04 0.00 - 0.07 K/uL  Comprehensive metabolic panel  Result Value Ref Range   Sodium 134 (L) 135 - 145 mmol/L   Potassium 4.0 3.5 - 5.1 mmol/L   Chloride 107 98 - 111 mmol/L   CO2 21 (L) 22 - 32 mmol/L   Glucose, Bld 106 (H) 70 - 99 mg/dL   BUN 15 8 - 23 mg/dL   Creatinine, Ser 0.34 0.44 - 1.00 mg/dL   Calcium 8.1 (L) 8.9 - 10.3 mg/dL   Total Protein 5.4 (L) 6.5 - 8.1 g/dL   Albumin 2.5 (L) 3.5 - 5.0 g/dL   AST 28 15 - 41 U/L   ALT 30 0 - 44 U/L   Alkaline Phosphatase 114 38 - 126 U/L   Total Bilirubin 1.1 0.3 - 1.2 mg/dL   GFR, Estimated >74 >25 mL/min   Anion gap 6 5 - 15  Lipase, blood  Result Value Ref Range   Lipase 30 11 - 51 U/L  Urinalysis, Routine w reflex microscopic -Urine, Clean Catch  Result Value Ref Range   Color, Urine YELLOW YELLOW   APPearance HAZY (A) CLEAR   Specific Gravity, Urine 1.008 1.005 - 1.030   pH 5.0 5.0 - 8.0   Glucose, UA NEGATIVE NEGATIVE mg/dL   Hgb urine dipstick SMALL (A) NEGATIVE   Bilirubin Urine NEGATIVE NEGATIVE   Ketones, ur NEGATIVE NEGATIVE mg/dL   Protein, ur NEGATIVE NEGATIVE mg/dL   Nitrite POSITIVE (A) NEGATIVE   Leukocytes,Ua LARGE (A) NEGATIVE   RBC / HPF 0-5 0 - 5  RBC/hpf   WBC, UA 21-50 0 - 5 WBC/hpf   Bacteria, UA RARE (A) NONE SEEN   Squamous Epithelial / HPF 0-5 0 - 5 /HPF   Mucus PRESENT    CT ABDOMEN PELVIS WO CONTRAST  Result Date: 01/29/2023 CLINICAL DATA:  History of recent colitis with persistent abdominal pain, initial encounter EXAM: CT ABDOMEN AND PELVIS WITHOUT CONTRAST TECHNIQUE: Multidetector CT imaging of the abdomen and pelvis was performed following the standard protocol without IV contrast. RADIATION DOSE REDUCTION: This exam was performed according to the departmental dose-optimization program which includes automated exposure control, adjustment of the mA and/or kV according to patient size and/or use of iterative reconstruction technique. COMPARISON:  01/23/2023 FINDINGS: Lower chest: Small pleural effusions are again identified. Bibasilar atelectatic changes are noted. Hepatobiliary: No focal liver abnormality is seen. No gallstones, gallbladder wall thickening, or biliary dilatation. Pancreas: Unremarkable. No pancreatic ductal dilatation or surrounding inflammatory changes. Spleen: Normal in size without focal abnormality. Adrenals/Urinary Tract: Adrenal glands are within normal limits. Kidneys show no renal calculi or obstructive changes. Mild perinephric stranding is again noted. The bladder is decompressed by Foley catheter. Stomach/Bowel: The previously seen perirectal inflammatory changes have improved in the interval from the prior exam. Considerable decrease in presacral soft tissue edema is noted. No obstructive or inflammatory changes of the colon are seen. Scattered fecal material is noted. The appendix is within normal limits. Small bowel and stomach are unremarkable. Vascular/Lymphatic: Aortic atherosclerosis. No enlarged abdominal or pelvic lymph nodes. Reproductive: Status post hysterectomy. No adnexal masses. Other: No abdominal wall hernia or abnormality. No abdominopelvic ascites. Musculoskeletal: Postsurgical changes in the  proximal right femur are noted. No acute bony abnormality is seen. Chronic T12 compression fracture is seen. IMPRESSION: Small bilateral pleural effusions. Interval improvement in the  degree of proctitis when compared with the prior study. Some residual inflammatory changes noted. Postsurgical changes in the proximal right femur. Associated edema in the surrounding soft tissues is noted. Electronically Signed   By: Alcide Clever M.D.   On: 01/29/2023 01:57   DG Chest Portable 1 View  Result Date: 01/29/2023 CLINICAL DATA:  Weakness EXAM: PORTABLE CHEST 1 VIEW COMPARISON:  01/13/2023 FINDINGS: Cardiac shadow is within normal limits. Aortic calcifications are noted. Lungs are well aerated bilaterally without focal infiltrate. Mild left basilar atelectasis is noted. IMPRESSION: Mild left basilar atelectasis is noted. Electronically Signed   By: Alcide Clever M.D.   On: 01/29/2023 00:55   CT ABDOMEN PELVIS W CONTRAST  Result Date: 01/23/2023 CLINICAL DATA:  Constipation, ileus on recent x-ray. Abdominal pain for a couple days. Confusion. EXAM: CT ABDOMEN AND PELVIS WITH CONTRAST TECHNIQUE: Multidetector CT imaging of the abdomen and pelvis was performed using the standard protocol following bolus administration of intravenous contrast. RADIATION DOSE REDUCTION: This exam was performed according to the departmental dose-optimization program which includes automated exposure control, adjustment of the mA and/or kV according to patient size and/or use of iterative reconstruction technique. CONTRAST:  75mL OMNIPAQUE IOHEXOL 350 MG/ML SOLN COMPARISON:  Abdominopelvic CT 01/13/2023 and radiographs. FINDINGS: Lower chest: Streaky atelectasis at both lung bases with interval development of trace bilateral pleural effusions. The heart size is stable. There is aortic and coronary artery atherosclerosis. Hepatobiliary: Heterogeneous peripheral low-density in the liver suggesting chronic passive congestion. No focal  abnormalities are demonstrated on the delayed post-contrast images. No evidence of gallstones, gallbladder wall thickening or biliary dilatation. Pancreas: Unremarkable. No pancreatic ductal dilatation or surrounding inflammatory changes. Spleen: Normal in size without focal abnormality. Adrenals/Urinary Tract: Both adrenal glands appear normal. Punctate nonobstructing calculus in the upper pole of the left kidney and a small right renal cyst (for which no follow-up is recommended) are stable. No evidence of ureteral calculus or residual hydronephrosis. The bladder is decompressed by a Foley catheter. Stomach/Bowel: No enteric contrast administered. The stomach appears unremarkable for its degree of distention. There is no small bowel distension, wall thickening or surrounding inflammation. Mildly prominent stool throughout the colon, decreased in volume within the rectum over the interval. New mild rectal wall thickening with inflammatory changes in the perirectal fat. The colon otherwise appears unremarkable. The appendix appears normal. Vascular/Lymphatic: There are no enlarged abdominal or pelvic lymph nodes. Aortic and branch vessel atherosclerosis without evidence of aneurysm or large vessel occlusion. Reproductive: Hysterectomy.  No adnexal mass. Other: Intact abdominal wall. New trace ascites with perirectal soft tissue stranding and asymmetric edema throughout the subcutaneous fat, greater on the right. No pneumoperitoneum. Musculoskeletal: Interval dynamic screw fixation of the previously demonstrated comminuted right femoral intertrochanteric fracture. The alignment of the fracture is improved. Unchanged T12 compression fracture no new fractures are identified. There is a small amount of hemorrhage within the subcutaneous fat and gluteus musculature in the upper right buttocks. Asymmetric subcutaneous edema in the right lateral abdominal wall and proximal right thigh. IMPRESSION: 1. Interval dynamic screw  fixation of the previously demonstrated comminuted right femoral intertrochanteric fracture with improved alignment. No new fractures are identified. 2. Asymmetric subcutaneous edema in the right lateral abdominal wall and proximal right thigh with a small amount of hemorrhage in the subcutaneous fat and gluteus musculature in the upper right buttocks. 3. New mild rectal wall thickening with inflammatory changes in the perirectal fat, suspicious for stercoral colitis/proctitis. No evidence of bowel obstruction or perforation. 4.  New trace bilateral pleural effusions with bibasilar atelectasis. 5. Heterogeneous peripheral low-density in the liver suggesting chronic passive congestion. 6.  Aortic Atherosclerosis (ICD10-I70.0). Electronically Signed   By: Carey Bullocks M.D.   On: 01/23/2023 13:20   US RENAL  Result Date: 01/16/2023 CLINICAL DATA:  [id number], with acute kidney injury. EXAM: RENAL / URINARY TRACT ULTRASOUND COMPLETE COMPARISON:  CT with IV contrast 01/13/2023 FINDINGS: Right Kidney: Renal measurements: 10.0 x 3.9 x 3.9 cm = volume: 79.4 mL. Echogenicity within normal limits. No mass, stones or hydronephrosis visualized. There was a 1.3 cm benign-appearing cyst in the inferior pole of this kidney on CT, but it was not visualized on this ultrasound. Left Kidney: Renal measurements: 9.5 x 5.0 x 3.8 cm = volume: 95.1 mL. Echogenicity within normal limits. No mass, stones or hydronephrosis visualized. The CT did, however demonstrate two separate 1 mm caliceal stones in the superior pole of this kidney. Bladder: Appears normal for degree of bladder distention. Other: None. IMPRESSION: 1. No increased renal cortical echogenicity or hydronephrosis. 2. Two 1 mm nonobstructive stones in the upper pole left kidney and a 1.3 cm cyst in the right inferior pole on CT, are not redemonstrated by ultrasound. Electronically Signed   By: Almira Bar M.D.   On: 01/16/2023 20:42   DG FEMUR PORT, MIN 2 VIEWS  RIGHT  Result Date: 01/14/2023 CLINICAL DATA:  Right hip fracture EXAM: RIGHT FEMUR PORTABLE 2 VIEW COMPARISON:  01/13/2023 FINDINGS: Interval ORIF of a comminuted intertrochanteric fracture of the right femur. Long intramedullary rod with distal interlocking screws and proximal lag screw. Improved fracture alignment lesser trochanteric fragment is medially displaced. No new fractures. Hip and knee joint alignment is maintained. Osteoarthritis of the knee. Expected postoperative changes within the soft tissues. IMPRESSION: Interval ORIF of right femur fracture with improved fracture alignment. Electronically Signed   By: Duanne Guess D.O.   On: 01/14/2023 20:51   Pelvis Portable  Result Date: 01/14/2023 CLINICAL DATA:  Postop EXAM: PORTABLE PELVIS 1-2 VIEWS COMPARISON:  Preoperative radiograph FINDINGS: Long intramedullary nail with trans trochanteric and distal locking screw fixation traverse comminuted proximal femur fracture. Improved fracture alignment from preoperative imaging. Recent postsurgical change includes air and edema in the soft tissues. IMPRESSION: ORIF of comminuted proximal femur fracture. No immediate postoperative complication. Electronically Signed   By: Narda Rutherford M.D.   On: 01/14/2023 18:26   DG FEMUR, MIN 2 VIEWS RIGHT  Result Date: 01/14/2023 CLINICAL DATA:  ORIF EXAM: Intraoperative fluoroscopy COMPARISON:  Preop x-ray 05/15/2023 FINDINGS: Six fluoroscopic spot images submitted for review demonstrate placement of dynamic hip screw with long intramedullary rod. Distal fixation screws. This transfixes the comminuted intertrochanteric hip fracture. Expected alignment. Imaging was obtained to aid in treatment. Please correlate with real-time fluoroscopy of 1 minute and 42 seconds. Cumulative dose 15.75 mGy IMPRESSION: Intraoperative fluoroscopy Electronically Signed   By: Karen Kays M.D.   On: 01/14/2023 16:55   DG C-Arm 1-60 Min-No Report  Result Date:  01/14/2023 Fluoroscopy was utilized by the requesting physician.  No radiographic interpretation.   DG C-Arm 1-60 Min-No Report  Result Date: 01/14/2023 Fluoroscopy was utilized by the requesting physician.  No radiographic interpretation.   CT CHEST ABDOMEN PELVIS W CONTRAST  Result Date: 01/13/2023 CLINICAL DATA:  Polytrauma, blunt EXAM: CT CHEST, ABDOMEN, AND PELVIS WITH CONTRAST TECHNIQUE: Multidetector CT imaging of the chest, abdomen and pelvis was performed following the standard protocol during bolus administration of intravenous contrast. RADIATION DOSE REDUCTION: This exam was performed  according to the departmental dose-optimization program which includes automated exposure control, adjustment of the mA and/or kV according to patient size and/or use of iterative reconstruction technique. CONTRAST:  75mL OMNIPAQUE IOHEXOL 350 MG/ML SOLN COMPARISON:  Same day radiographs FINDINGS: CT CHEST FINDINGS Cardiovascular: No significant vascular findings. Thoracic aorta is nonaneurysmal. Atherosclerotic calcifications of the aorta and coronary arteries. Calcified aortic valve. Central pulmonary vasculature is nondilated. Normal heart size. No pericardial effusion. Mediastinum/Nodes: No enlarged mediastinal, hilar, or axillary lymph nodes. Thyroid gland, trachea, and esophagus demonstrate no significant findings. Lungs/Pleura: Bandlike scarring or atelectasis within the right middle lobe and to a lesser degree within the lingula. No focal airspace consolidation. No pleural effusion or pneumothorax. Musculoskeletal: Chronic-appearing severe wedge compression fracture of the T12 vertebral body. Remaining thoracic vertebral body heights are maintained. No chest wall hematoma. CT ABDOMEN PELVIS FINDINGS Hepatobiliary: No focal liver abnormality is seen. No gallstones, gallbladder wall thickening, or biliary dilatation. Pancreas: Unremarkable. No pancreatic ductal dilatation or surrounding inflammatory changes.  Spleen: Normal in size without focal abnormality. Adrenals/Urinary Tract: Unremarkable adrenal glands. Punctate 2 mm stone within the upper pole of the left kidney. No right-sided renal calculi. No solid renal lesion. Mild-to-moderate bilateral hydronephrosis. Moderately distended urinary bladder. 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 Remainder of the stomach appears within normal limits. No dilated loops of bowel. No focal bowel wall thickening or inflammatory changes. Vascular/Lymphatic: Aortic atherosclerosis. No enlarged abdominal or pelvic lymph nodes. Reproductive: Status post hysterectomy. No adnexal masses. Other: No free fluid. No abdominopelvic fluid collection. No pneumoperitoneum. No abdominal wall hernia. Musculoskeletal: Acute comminuted intertrochanteric fracture of the right femur with varus angulation. Soft tissue fullness at the fracture site. No well-defined hematoma. Lumbar vertebral body heights are maintained. Pelvic bony ring intact without fracture or diastasis. IMPRESSION: 1. Acute comminuted intertrochanteric fracture of the right femur with varus angulation. 2. Chronic-appearing severe wedge compression fracture of the T12 vertebral body. An acute on chronic component is not excluded. Correlate with point tenderness. 3. Mild-to-moderate bilateral hydronephrosis with moderate distention of the urinary bladder. Correlate for urinary retention or outlet obstruction. 4. Punctate left nephrolithiasis. 5. 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 6. Aortic atherosclerosis (ICD10-I70.0). Electronically Signed   By: Duanne Guess D.O.   On: 01/13/2023 13:46   CT HEAD WO CONTRAST  Result Date: 01/13/2023 CLINICAL DATA:  Head trauma, moderate-severe; Polytrauma, blunt EXAM: CT HEAD WITHOUT CONTRAST CT CERVICAL SPINE WITHOUT CONTRAST TECHNIQUE: Multidetector CT  imaging of the head and cervical spine was performed following the standard protocol without intravenous contrast. Multiplanar CT image reconstructions of the cervical spine were also generated. RADIATION DOSE REDUCTION: This exam was performed according to the departmental dose-optimization program which includes automated exposure control, adjustment of the mA and/or kV according to patient size and/or use of iterative reconstruction technique. COMPARISON:  CT head March 02, 2022. 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 No evidence of acute large vascular territory infarct, acute hemorrhage, mass lesion, or midline shift. Vascular: Calcific atherosclerosis. Skull: No acute fracture. Sinuses/Orbits: Clear sinuses.  No acute orbital findings. Other: No mastoid effusions. CT CERVICAL SPINE FINDINGS Alignment: Mild anterolisthesis of C5 on C6, likely degenerative given facet arthropathy at this level. Otherwise, no substantial sagittal subluxation. Skull base and vertebrae: No evidence of acute fracture. Vertebral body heights are maintained. Osteopenia. Soft tissues and spinal canal: No  prevertebral fluid or swelling. No visible canal hematoma. Disc levels: Multilevel facet and uncovertebral hypertrophy with varying degrees of neural foraminal stenosis. 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 Osteopenia. Upper chest: Biapical pleuroparenchymal scarring.  No consolidation. IMPRESSION: No evidence of acute abnormality intracranially or in the cervical spine. Electronically Signed   By: Feliberto Harts M.D.   On: 01/13/2023 13:35   CT CERVICAL SPINE WO CONTRAST  Result Date: 01/13/2023 CLINICAL DATA:  Head trauma, moderate-severe; Polytrauma, blunt EXAM: CT HEAD WITHOUT CONTRAST CT CERVICAL SPINE WITHOUT CONTRAST TECHNIQUE: Multidetector CT imaging of the head and cervical spine was performed following the standard protocol without intravenous contrast. Multiplanar  CT image reconstructions of the cervical spine were also generated. RADIATION DOSE REDUCTION: This exam was performed according to the departmental dose-optimization program which includes automated exposure control, adjustment of the mA and/or kV according to patient size and/or use of iterative reconstruction technique. COMPARISON:  CT head March 02, 2022. 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 No evidence of acute large vascular territory infarct, acute hemorrhage, mass lesion, or midline shift. Vascular: Calcific atherosclerosis. Skull: No acute fracture. Sinuses/Orbits: Clear sinuses.  No acute orbital findings. Other: No mastoid effusions. CT CERVICAL SPINE FINDINGS Alignment: Mild anterolisthesis of C5 on C6, likely degenerative given facet arthropathy at this level. Otherwise, no substantial sagittal subluxation. Skull base and vertebrae: No evidence of acute fracture. Vertebral body heights are maintained. Osteopenia. Soft tissues and spinal canal: No prevertebral fluid or swelling. No visible canal hematoma. Disc levels: Multilevel facet and uncovertebral hypertrophy with varying degrees of neural foraminal stenosis. 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 Osteopenia. Upper chest: Biapical pleuroparenchymal scarring.  No consolidation. IMPRESSION: No evidence of acute abnormality intracranially or in the cervical spine. Electronically Signed   By: Feliberto Harts M.D.   On: 01/13/2023 13:35   DG Pelvis Portable  Result Date: 01/13/2023 CLINICAL DATA:  Right leg pain after fall. EXAM: PORTABLE PELVIS 1-2 VIEWS; RIGHT FEMUR 2 VIEWS COMPARISON:  None Available. FINDINGS: Acute comminuted right intertrochanteric femur fracture with varus angulation. No additional fracture. No dislocation. Mild degenerative changes of both hip joints. Moderate right knee medial compartment osteoarthritis. Diffuse osteopenia. Soft tissues are unremarkable. IMPRESSION: 1.  Acute comminuted right intertrochanteric femur fracture. Electronically Signed   By: Obie Dredge M.D.   On: 01/13/2023 12:54   DG FEMUR, MIN 2 VIEWS RIGHT  Result Date: 01/13/2023 CLINICAL DATA:  Right leg pain after fall. EXAM: PORTABLE PELVIS 1-2 VIEWS; RIGHT FEMUR 2 VIEWS COMPARISON:  None Available. FINDINGS: Acute comminuted right intertrochanteric femur fracture with varus angulation. No additional fracture. No dislocation. Mild degenerative changes of both hip joints. Moderate right knee medial compartment osteoarthritis. Diffuse osteopenia. Soft tissues are unremarkable. IMPRESSION: 1. Acute comminuted right intertrochanteric femur fracture. Electronically Signed   By: Obie Dredge M.D.   On: 01/13/2023 12:54   DG Chest Port 1 View  Result Date: 01/13/2023 CLINICAL DATA:  Trauma EXAM: PORTABLE CHEST 1 VIEW COMPARISON:  None Available. FINDINGS: The heart size and mediastinal contours are within normal limits. Aortic atherosclerosis. Both lungs are clear. The visualized skeletal structures are unremarkable. IMPRESSION: No active disease. Electronically Signed   By: Duanne Guess D.O.   On: 01/13/2023 12:53     EKG EKG Interpretation  Date/Time:  Monday Deandrea Vanpelt 29 2024 23:00:14 EDT Ventricular Rate:  51 PR Interval:  151 QRS Duration: 79 QT Interval:  469 QTC Calculation: 432 R Axis:   3 Text  Interpretation: Sinus rhythm Borderline low voltage, extremity leads Confirmed by Nicanor Alcon, Armonee Bojanowski ([id number]) on 01/29/2023 12:09:23 AM  Radiology CT ABDOMEN PELVIS WO CONTRAST  Result Date: 01/29/2023 CLINICAL DATA:  History of recent colitis with persistent abdominal pain, initial encounter EXAM: CT ABDOMEN AND PELVIS WITHOUT CONTRAST TECHNIQUE: Multidetector CT imaging of the abdomen and pelvis was performed following the standard protocol without IV contrast. RADIATION DOSE REDUCTION: This exam was performed according to the departmental dose-optimization program which includes automated  exposure control, adjustment of the mA and/or kV according to patient size and/or use of iterative reconstruction technique. COMPARISON:  01/23/2023 FINDINGS: Lower chest: Small pleural effusions are again identified. Bibasilar atelectatic changes are noted. Hepatobiliary: No focal liver abnormality is seen. No gallstones, gallbladder wall thickening, or biliary dilatation. Pancreas: Unremarkable. No pancreatic ductal dilatation or surrounding inflammatory changes. Spleen: Normal in size without focal abnormality. Adrenals/Urinary Tract: Adrenal glands are within normal limits. Kidneys show no renal calculi or obstructive changes. Mild perinephric stranding is again noted. The bladder is decompressed by Foley catheter. Stomach/Bowel: The previously seen perirectal inflammatory changes have improved in the interval from the prior exam. Considerable decrease in presacral soft tissue edema is noted. No obstructive or inflammatory changes of the colon are seen. Scattered fecal material is noted. The appendix is within normal limits. Small bowel and stomach are unremarkable. Vascular/Lymphatic: Aortic atherosclerosis. No enlarged abdominal or pelvic lymph nodes. Reproductive: Status post hysterectomy. No adnexal masses. Other: No abdominal wall hernia or abnormality. No abdominopelvic ascites. Musculoskeletal: Postsurgical changes in the proximal right femur are noted. No acute bony abnormality is seen. Chronic T12 compression fracture is seen. IMPRESSION: Small bilateral pleural effusions. Interval improvement in the degree of proctitis when compared with the prior study. Some residual inflammatory changes noted. Postsurgical changes in the proximal right femur. Associated edema in the surrounding soft tissues is noted. Electronically Signed   By: Alcide Clever M.D.   On: 01/29/2023 01:57   DG Chest Portable 1 View  Result Date: 01/29/2023 CLINICAL DATA:  Weakness EXAM: PORTABLE CHEST 1 VIEW COMPARISON:  01/13/2023  FINDINGS: Cardiac shadow is within normal limits. Aortic calcifications are noted. Lungs are well aerated bilaterally without focal infiltrate. Mild left basilar atelectasis is noted. IMPRESSION: Mild left basilar atelectasis is noted. Electronically Signed   By: Alcide Clever M.D.   On: 01/29/2023 00:55    Procedures Procedures    Medications Ordered in ED Medications  cefTRIAXone (ROCEPHIN) 1 g in sodium chloride 0.9 % 100 mL IVPB (has no administration in time range)    ED Course/ Medical Decision Making/ A&P                             Medical Decision Making Patient with global weakness and foley catheter   Amount and/or Complexity of Data Reviewed Independent Historian:     Details: Daughter see above  External Data Reviewed: notes.    Details: Previous notes reviewed  Labs: ordered.    Details: Normal 6.3, low hemoglobin 7.8, elevated platelets 442.   Sodium 134, potassium 4, normal creatinine .94 urine is consistent for uti.  Cultures sent  Radiology: ordered and independent interpretation performed.    Details: No obstruction or free air on CT, normal CHF on cxr by me   Risk Prescription drug management. Decision regarding hospitalization.    Final Clinical Impression(s) / ED Diagnoses Final diagnoses:  Acute cystitis without hematuria    The patient appears reasonably  stabilized for admission considering the current resources, flow, and capabilities available in the ED at this time, and I doubt any other [facility] requiring further screening and/or treatment in the ED prior to admission.  Rx / DC Orders ED Discharge Orders     None         Kalin Amrhein, MD 01/29/23 [phone]

## 2023-01-30 DIAGNOSIS — N3000 Acute cystitis without hematuria: Secondary | ICD-10-CM | POA: Diagnosis not present

## 2023-01-30 LAB — CBC
HCT: 27.6 % — ABNORMAL LOW (ref 36.0–46.0)
Hemoglobin: 8.7 g/dL — ABNORMAL LOW (ref 12.0–15.0)
MCH: 29.7 pg (ref 26.0–34.0)
MCHC: 31.5 g/dL (ref 30.0–36.0)
MCV: 94.2 fL (ref 80.0–100.0)
Platelets: 513 10*3/uL — ABNORMAL HIGH (ref 150–400)
RBC: 2.93 MIL/uL — ABNORMAL LOW (ref 3.87–5.11)
RDW: 15.3 % (ref 11.5–15.5)
WBC: 5.1 10*3/uL (ref 4.0–10.5)
nRBC: 0 % (ref 0.0–0.2)

## 2023-01-30 LAB — BASIC METABOLIC PANEL
Anion gap: 7 (ref 5–15)
BUN: 13 mg/dL (ref 8–23)
CO2: 22 mmol/L (ref 22–32)
Calcium: 8.4 mg/dL — ABNORMAL LOW (ref 8.9–10.3)
Chloride: 105 mmol/L (ref 98–111)
Creatinine, Ser: 0.92 mg/dL (ref 0.44–1.00)
GFR, Estimated: >60 mL/min (ref >60)
Glucose, Bld: 99 mg/dL (ref 70–99)
Potassium: 3.8 mmol/L (ref 3.5–5.1)
Sodium: 134 mmol/L — ABNORMAL LOW (ref 135–145)

## 2023-01-30 MED ORDER — SENNOSIDES-DOCUSATE SODIUM 8.6-50 MG PO TABS
1.0000 | ORAL_TABLET | Freq: Two times a day (BID) | ORAL | Status: DC
  Administered 2023-01-30 – 2023-02-02 (×3): 1 via ORAL
  Filled 2023-01-30 (×4): qty 1

## 2023-01-30 NOTE — Plan of Care (Signed)
  Problem: Education: Goal: Verbalization of understanding the information provided (i.e., activity precautions, restrictions, etc) will improve Outcome: Completed/Met Goal: Individualized Educational Video(s) Outcome: Completed/Met   Problem: Activity: Goal: Ability to ambulate and perform ADLs will improve Outcome: Completed/Met   Problem: Clinical Measurements: Goal: Postoperative complications will be avoided or minimized Outcome: Completed/Met   Problem: Self-Concept: Goal: Ability to maintain and perform role responsibilities to the fullest extent possible will improve Outcome: Completed/Met   Problem: Pain Management: Goal: Pain level will decrease Outcome: Completed/Met   Problem: Clinical Measurements: Goal: Ability to maintain clinical measurements within normal limits will improve Outcome: Progressing Goal: Will remain free from infection Outcome: Progressing Goal: Diagnostic test results will improve Outcome: Progressing Goal: Respiratory complications will improve Outcome: Progressing Goal: Cardiovascular complication will be avoided Outcome: Progressing   Problem: Activity: Goal: Risk for activity intolerance will decrease Outcome: Progressing   Problem: Nutrition: Goal: Adequate nutrition will be maintained Outcome: Progressing   Problem: Coping: Goal: Level of anxiety will decrease Outcome: Progressing   Problem: Elimination: Goal: Will not experience complications related to bowel motility Outcome: Progressing Goal: Will not experience complications related to urinary retention Outcome: Progressing   Problem: Pain Managment: Goal: General experience of comfort will improve Outcome: Progressing   Problem: Safety: Goal: Ability to remain free from injury will improve Outcome: Progressing   Problem: Skin Integrity: Goal: Risk for impaired skin integrity will decrease Outcome: Progressing   Problem: Urinary Elimination: Goal: Signs and  symptoms of infection will decrease Outcome: Progressing

## 2023-01-30 NOTE — Progress Notes (Signed)
[facility] PROGRESS NOTE  ISRAEL WUNDER (DOB: 09-28-1941) ZHY:[id number] PCP: Vivien Presto, MD  Brief Narrative: Tamara Bullock is an 82 y.o. female with a history of recent hip fracture s/p ORIF 4/15, stage IIIa CKD, OSA, HTN, HLD, CVA, CHF, hypothyroidism, depression and anxiety who presented to the ED from SNF on 01/28/2023 with abdominal pain found to have UTI.   Subjective: Ate better breakfast than she's eaten in many days. Abdominal pain significantly improved. Used bedpan w/stool output x2 this AM. No fever. Still very anxious which is her recent baseline per son and daughter at bedside.  Objective: BP (!) 136/47   Pulse (!) 59   Temp 98.2 F (36.8 C) (Oral)   Resp (!) 22   Ht 5\' 3"  (1.6 m)   Wt 57.8 kg   SpO2 99%   BMI 22.57 kg/m   Gen: Elderly, frail female in no distress Pulm: Clear, nonlabored  CV: RRR, bradycardic, no MRG or pitting edema GI: Soft, NT, ND, +BS Neuro: Alert and oriented. No new focal deficits. Ext: Warm, no deformities.  Skin: R lateral hip/thigh with healed surgical incision. No other rashes, lesions or ulcers on visualized skin   Assessment & Plan: UTI: Has defervesced, no leukocytosis - Exchange foley catheter (last placed 4/18) and send urine culture from new catheter.  - Continue ceftriaxone for now pending urine cultures. Would opt for slightly longer duration than typical given her foley and mild perinephric stranding.  - Pursue trial of voiding after treatment of UTI.  History of right hip fracture s/p ORIF:  - Continue PT/OT while admitted and plan return to SNF for ongoing rehabilitation at DC  Constipation, stercoral colitis: Had BM 5/1, abd benign.  - Limit narcotics - Continue regular bowel regimen.   Depression, anxiety:  - Continue zoloft, prn lorazepam.   History of CVA, HLD: ?pseudobulbar affect - Continue plavix, statin, zetia  Chronic combined HFrEF, HTN: Euvolemic currently.  - Continue coreg with holding  parameters (bradycardia), entresto (BP stable). Can restart torsemide as well if taking adequate po today.  Hypothyroidism: No symptoms. - Continue thyroid supplement  Stage IIIa CKD: Near baseline. Has been having  - Avoid nephrotoxins  AOCD: Hgb up suggesting marrow response to recent fracture/surgery. Required 2u PRBCs last admission. - Monitor intermittently.  Tyrone Nine, MD [facility] [email or web address] 01/30/2023, 1:38 PM

## 2023-01-30 NOTE — TOC Initial Note (Signed)
Transition of Care [facility]) - Initial/Assessment Note    Patient Details  Name: Tamara Bullock MRN: [id number] Date of Birth: Feb 28, 1941  Transition of Care [facility]) CM/SW Contact:    Lanier Clam, RN Phone Number: 01/30/2023, 11:05 AM  Clinical Narrative: confirmed from [facility] SNF rep Meryle Ready following for return.                   Expected Discharge Plan: Skilled Nursing Facility Barriers to Discharge: Continued Medical Work up   Patient Goals and CMS Choice Patient states their goals for this hospitalization and ongoing recovery are:: [facility] CMS Medicare.gov Compare Post Acute Care list provided to:: Patient Represenative (must comment) (Trina(dtr)) Choice offered to / list presented to : Adult Children [place] ownership interest in [facility].provided to:: Adult Children    Expected Discharge Plan and Services   Discharge Planning Services: CM Consult Post Acute Care Choice: Skilled Nursing Facility Living arrangements for the past 2 months: Skilled Nursing Facility                                      Prior Living Arrangements/Services Living arrangements for the past 2 months: Skilled Nursing Facility Lives with:: Facility Resident Patient language and need for interpreter reviewed:: Yes Do you feel safe going back to the place where you live?: Yes      Need for Family Participation in Patient Care: Yes (Comment) Care giver support system in place?: Yes (comment) Current home services: DME (rw,cane) Criminal Activity/Legal Involvement Pertinent to Current Situation/Hospitalization: No - Comment as needed  Activities of Daily Living Home Assistive Devices/Equipment: Dentures (specify type), Hearing aid (Patient from SNF for rehab, total care per daughter) ADL Screening (condition at time of admission) Patient's cognitive ability adequate to safely complete daily activities?: No Is the patient deaf or have  difficulty hearing?: Yes Does the patient have difficulty seeing, even when wearing glasses/contacts?: No Does the patient have difficulty concentrating, remembering, or making decisions?: Yes Patient able to express need for assistance with ADLs?: No Does the patient have difficulty dressing or bathing?: Yes Independently performs ADLs?: No Communication: Needs assistance Is this a change from baseline?: Pre-admission baseline Dressing (OT): Dependent Is this a change from baseline?: Pre-admission baseline Grooming: Dependent Is this a change from baseline?: Pre-admission baseline Feeding: Needs assistance Is this a change from baseline?: Pre-admission baseline Bathing: Dependent Is this a change from baseline?: Pre-admission baseline Toileting: Dependent Is this a change from baseline?: Pre-admission baseline In/Out Bed: Dependent Is this a change from baseline?: Pre-admission baseline Walks in Home: Dependent Is this a change from baseline?: Pre-admission baseline Does the patient have difficulty walking or climbing stairs?: Yes Weakness of Legs: Both Weakness of Arms/Hands: Both  Permission Sought/Granted Permission sought to share information with : Case Manager Permission granted to share information with : Yes, Verbal Permission Granted  Share Information with NAME: Case manager           Emotional Assessment Appearance:: Appears stated age Attitude/Demeanor/Rapport: Gracious Affect (typically observed): Accepting Orientation: : Oriented to Self Alcohol / Substance Use: Not Applicable Psych Involvement: No (comment)  Admission diagnosis:  Pleural effusion [J90] Acute cystitis without hematuria [N30.00] Sepsis due to urinary tract infection (HCC) [A41.9, N39.0] Patient Active Problem List   Diagnosis Date Noted   Sepsis due to urinary tract infection (HCC) 01/29/2023   UTI (urinary tract infection) 01/29/2023  Closed comminuted intertrochanteric fracture of  right femur (HCC) 01/13/2023   OSA (obstructive sleep apnea) 07/18/2022   Hyperlipidemia    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 (CVA) 03/12/2022   History of stroke 03/07/2022   Chronic combined systolic and diastolic CHF (congestive heart failure) (HCC) 03/04/2022   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   Essential hypertension 03/03/2022   Stage 3a chronic kidney disease (CKD) (HCC) 03/03/2022   Depression 03/03/2022   Hypothyroidism 03/03/2022   PCP:  Vivien Presto, MD Pharmacy:   [facility] [place], [place] - [address] [place] [address] Phone: [phone] Fax: [phone]     Social Determinants of Health (SDOH) Social History: SDOH Screenings   Food Insecurity: No Food Insecurity (01/29/2023)  Housing: Low Risk  (01/29/2023)  Transportation Needs: No Transportation Needs (01/29/2023)  Utilities: Not At Risk (01/29/2023)  Depression (PHQ2-9): Low Risk  (05/17/2022)  Recent Concern: Depression (PHQ2-9) - High Risk (04/04/2022)  Tobacco Use: Low Risk  (01/28/2023)   SDOH Interventions:     Readmission Risk Interventions     No data to display

## 2023-01-30 NOTE — Evaluation (Signed)
Occupational Therapy Evaluation Patient Details Name: Tamara Bullock MRN: [id number] DOB: 18-Jul-1941 Today's Date: 01/30/2023   History of Present Illness 82 yo female admitted from SNF with UTI. Recent R hip IM nailing  01/14/23. Hx of fall, CKD, CVA, hypothyroidism, OSA   Clinical Impression   Pt presents with decline in function and safety with ADLs and ADL mobility with impaired strength, balance, endurance and cognition. PTA pt at SNF on skilled rehab unit following R hip IM nailing. Pt previously lived at [facility] on ILF apartment. Pt currently requires mod A to sit EOB, min A with UB ADLs, max - total A with LB ADLs, total A with toileting at bed level. Unable to attempt BSC due to pt needing to return to supine to use bed pan for BM. Per PT note pt required Stedy for transfers during PT eval. Pt would benefit from acute OT services to address impairments to maximize level of function and safety      Recommendations for follow up therapy are one component of a multi-disciplinary discharge planning process, led by the attending physician.  Recommendations may be updated based on patient status, additional functional criteria and insurance authorization.   Assistance Recommended at Discharge Frequent or constant Supervision/Assistance  Patient can return home with the following A lot of help with bathing/dressing/bathroom;Two people to help with walking and/or transfers;Direct supervision/assist for medications management;Help with stairs or ramp for entrance;Assist for transportation    Functional Status Assessment  Patient has had a recent decline in their functional status and demonstrates the ability to make significant improvements in function in a reasonable and predictable amount of time.  Equipment Recommendations  BSC/3in1    Recommendations for Other Services       Precautions / Restrictions Precautions Precautions: Fall Restrictions Weight Bearing Restrictions: No RLE Weight  Bearing: Weight bearing as tolerated      Mobility Bed Mobility Overal bed mobility: Needs Assistance Bed Mobility: Supine to Sit, Sit to Supine     Supine to sit: HOB elevated, Mod assist Sit to supine: HOB elevated, Max assist        Transfers                   General transfer comment: unable to attempt [facility] transfer due to pt needing to return to supine fir bed pan for BM. Per PT note, use of Stedy was required      Balance Overall balance assessment: Needs assistance Sitting-balance support: Bilateral upper extremity supported, Feet supported Sitting balance-Leahy Scale: Fair                                     ADL either performed or assessed with clinical judgement   ADL Overall ADL's : Needs assistance/impaired Eating/Feeding: Set up;Supervision/ safety;Sitting;Bed level   Grooming: Wash/dry hands;Wash/dry face;Min guard;Sitting   Upper Body Bathing: Minimal assistance;Sitting   Lower Body Bathing: Maximal assistance;Sitting/lateral leans   Upper Body Dressing : Minimal assistance;Sitting   Lower Body Dressing: Total assistance     Toilet Transfer Details (indicate cue type and reason): unable to attempt [facility] transfer due to pt needing to return to supine fir bed pan for BM Toileting- Clothing Manipulation and Hygiene: Independent;Bed level               Vision Ability to See in Adequate Light: 0 Adequate Patient Visual Report: No change from baseline  Perception     Praxis      Pertinent Vitals/Pain Pain Assessment Pain Assessment: Faces Faces Pain Scale: Hurts even more Pain Location: R hip, abdomen with mobility Pain Descriptors / Indicators: Guarding, Grimacing Pain Intervention(s): Limited activity within patient's tolerance, Monitored during session, Repositioned     Hand Dominance Right   Extremity/Trunk Assessment Upper Extremity Assessment Upper Extremity Assessment: Generalized weakness   Lower  Extremity Assessment Lower Extremity Assessment: Generalized weakness;RLE deficits/detail RLE Deficits / Details: R LE edematous       Communication Communication Communication: HOH;Expressive difficulties   Cognition Arousal/Alertness: Awake/alert Behavior During Therapy: WFL for tasks assessed/performed Overall Cognitive Status: Difficult to assess Area of Impairment: Following commands, Safety/judgement, Memory, Problem solving                 Orientation Level: Disoriented to, Place, Time, Situation   Memory: Decreased short-term memory Following Commands: Follows one step commands consistently Safety/Judgement: Decreased awareness of safety, Decreased awareness of deficits   Problem Solving: Requires verbal cues General Comments: Expressive difficulties. Impulsive     General Comments       Exercises     Shoulder Instructions      Home Living Family/patient expects to be discharged to:: Skilled nursing facility Living Arrangements: Spouse/significant other Available Help at Discharge: Family;Available PRN/intermittently Type of Home: Independent living facility Home Access: Level entry     Home Layout: One level     Bathroom Shower/Tub: Estate manager/land agent Accessibility: Yes   Home Equipment: Rollator (4 wheels);Cane - single point;Crutches;Shower seat          Prior Functioning/Environment Prior Level of Function : Needs assist;History of Falls (last six months)             Mobility Comments: Rollator PRN but rare per family ADLs Comments: Assist from staff at facility for bathing, dressing and toileting        OT Problem List: Decreased strength;Impaired balance (sitting and/or standing);Decreased activity tolerance;Decreased range of motion;Decreased knowledge of use of DME or AE;Decreased knowledge of precautions;Decreased safety awareness;Pain      OT Treatment/Interventions: Self-care/ADL training;Therapeutic exercise;DME  and/or AE instruction;Therapeutic activities;Patient/family education;Balance training    OT Goals(Current goals can be found in the care plan section) Acute Rehab OT Goals Patient Stated Goal: go back to therapy OT Goal Formulation: With patient/family Time For Goal Achievement: 02/13/23 Potential to Achieve Goals: Good  OT Frequency: Min 2X/week    Co-evaluation              AM-PAC OT "6 Clicks" Daily Activity     Outcome Measure Help from another person eating meals?: A Little Help from another person taking care of personal grooming?: A Little Help from another person toileting, which includes using toliet, bedpan, or urinal?: Total Help from another person bathing (including washing, rinsing, drying)?: A Lot Help from another person to put on and taking off regular upper body clothing?: A Little Help from another person to put on and taking off regular lower body clothing?: Total 6 Click Score: 13   End of Session    Activity Tolerance: Patient tolerated treatment well Patient left: with family/visitor present;in bed;with bed alarm set;with call bell/phone within reach  OT Visit Diagnosis: Other abnormalities of gait and mobility (R26.89);Muscle weakness (generalized) (M62.81);History of falling (Z91.81);Pain Pain - Right/Left: Right Pain - part of body: Leg;Hip                Time: 1610-9604 OT Time  Calculation (min): 17 min Charges:  OT General Charges $OT Visit: 1 Visit OT Evaluation $OT Eval Moderate Complexity: 1 Mod    Galen Manila 01/30/2023, 3:03 PM

## 2023-01-30 NOTE — NC FL2 (Signed)
[place] MEDICA[employer] [facility] LEVEL OF CARE FORM     IDENTIFICATION  Patient Name: Tamara Bullock Birthdate: 1941/09/21 Sex: female Admission Date (Current Location): 01/28/2023  [facility] and [place] Number:  Producer, television/film/video and Address:  [facility],  [address] [place], [place] [address]      Provider Number: [id number]  Attending Physician Name and Address:  Tyrone Nine, MD  Relative Name and Phone Number:  Darreld Mclean Schiefelbein(dtr) [phone]    Current Level of Care: Hospital Recommended Level of Care: Skilled Nursing Facility Prior Approval Number:    Date Approved/Denied:   PASRR Number: [id number]  Discharge Plan: SNF    Current Diagnoses: Patient Active Problem List   Diagnosis Date Noted   Sepsis due to urinary tract infection (HCC) 01/29/2023   UTI (urinary tract infection) 01/29/2023   Closed comminuted intertrochanteric fracture of right femur (HCC) 01/13/2023   OSA (obstructive sleep apnea) 07/18/2022   Hyperlipidemia    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 (CVA) 03/12/2022   History of stroke 03/07/2022   Chronic combined systolic and diastolic CHF (congestive heart failure) (HCC) 03/04/2022   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   Essential hypertension 03/03/2022   Stage 3a chronic kidney disease (CKD) (HCC) 03/03/2022   Depression 03/03/2022   Hypothyroidism 03/03/2022    Orientation RESPIRATION BLADDER Height & Weight     Self, Time, Situation  Normal Indwelling catheter Weight: 57.8 kg Height:  5\' 3"  (160 cm)  BEHAVIORAL SYMPTOMS/MOOD NEUROLOGICAL BOWEL NUTRITION STATUS      Continent Diet (Regular)  AMBULATORY STATUS COMMUNICATION OF NEEDS Skin   Total Care Verbally Other (Comment) (sacrum redness)                       Personal Care Assistance Level of Assistance  Bathing, Feeding, Dressing Bathing Assistance: Limited assistance Feeding assistance: Limited assistance Dressing Assistance:  Maximum assistance Total Care Assistance: Maximum assistance   Functional Limitations Info  Sight, Hearing, Speech Sight Info: Impaired (eyeglasses) Hearing Info: Impaired (Bilateral hearing aids) Speech Info: Adequate (Regular diet)    SPECIAL CARE FACTORS FREQUENCY  PT (By licensed PT), OT (By licensed OT)     PT Frequency: 5x week OT Frequency: 5x week            Contractures Contractures Info: Not present    Additional Factors Info  Code Status, Allergies Code Status Info: Full Allergies Info: Diphenhydramine Hcl, Moxifloxacin Hcl In Nacl, Penicillin G, Sulfa Antibiotics, Colistin, Diclofenac Sodium, Esomeprazole, Estradiol, Medroxyprogesterone Acetate, Neomycin-polymyxin-hc, Nitrofurantoin, Nsaids, Prednisone, Proparacaine, Rabeprazole, Amitriptyline, Aspirin, Azithromycin, Carbocaine (Mepivacaine), Cimetidine, Ciprofloxacin, Cisapride, Clindamycin/lincomycin, Cortisporin-tc (Neomycin-colist-hc-thonzonium), Escitalopram, Hydrochlorothiazide, Hyoscyamine, Ibuprofen, Iodine, Medroxyprogesterone, Meloxicam, Minocycline, Moxifloxacin, Nitrofuran Derivatives, Ofloxacin, Pantoprazole, Penicillins, Pneumococcal Vaccines, Prednisolone, Prevacid (Lansoprazole), Propoxyphene, Soap, Spironolactone, Tobradex (Tobramycin-dexamethasone), Trolamine Salicylate, Verapamil, Voltaren (Diclofenac), Diphenhydramine, Hydrocodone, Tramadol           Current Medications (01/30/2023):  This is the current hospital active medication list Current Facility-Administered Medications  Medication Dose Route Frequency Provider Last Rate Last Admin   acetaminophen (TYLENOL) tablet 650 mg  650 mg Oral Q6H PRN Kirby Crigler, Mir M, MD   650 mg at 01/29/23 1750   Or   acetaminophen (TYLENOL) suppository 650 mg  650 mg Rectal Q6H PRN Kirby Crigler, Mir M, MD       albuterol (PROVENTIL) (2.5 MG/3ML) 0.083% nebulizer solution 2.5 mg  2.5 mg Nebulization Q2H PRN Kirby Crigler, Mir M, MD       atorvastatin (LIPITOR) tablet  80  mg  80 mg Oral Daily Kirby Crigler, Mir M, MD   80 mg at 01/30/23 1029   carvedilol (COREG) tablet 3.125 mg  3.125 mg Oral BID WC Kirby Crigler, Mir M, MD   3.125 mg at 01/29/23 1750   cefTRIAXone (ROCEPHIN) 1 g in sodium chloride 0.9 % 100 mL IVPB  1 g Intravenous Q24H Kirby Crigler, Mir M, MD 200 mL/hr at 01/30/23 0615 1 g at 01/30/23 0615   Chlorhexidine Gluconate Cloth 2 % PADS 6 each  6 each Topical Daily Maryln Gottron, MD   6 each at 01/30/23 1029   clopidogrel (PLAVIX) tablet 75 mg  75 mg Oral Daily Kirby Crigler, Mir M, MD   75 mg at 01/30/23 1029   docusate sodium (COLACE) capsule 100 mg  100 mg Oral BID Kirby Crigler, Mir M, MD   100 mg at 01/30/23 1029   enoxaparin (LOVENOX) injection 40 mg  40 mg Subcutaneous Q24H Kirby Crigler, Mir M, MD   40 mg at 01/30/23 1029   ezetimibe (ZETIA) tablet 10 mg  10 mg Oral Daily Kirby Crigler, Mir M, MD   10 mg at 01/30/23 1029   haloperidol lactate (HALDOL) injection 1 mg  1 mg Intravenous Q6H PRN Kirby Crigler, Mir M, MD       levothyroxine (SYNTHROID) tablet 50 mcg  50 mcg Oral Daily Kirby Crigler, Mir M, MD   50 mcg at 01/30/23 0606   LORazepam (ATIVAN) tablet 0.5 mg  0.5 mg Oral Q8H PRN Kirby Crigler, Mir M, MD   0.5 mg at 01/29/23 2310   mirtazapine (REMERON) tablet 7.5 mg  7.5 mg Oral QHS Kirby Crigler, Mir M, MD   7.5 mg at 01/29/23 2310   ondansetron (ZOFRAN) tablet 4 mg  4 mg Oral Q6H PRN Kirby Crigler, Mir M, MD       Or   ondansetron [facility]) injection 4 mg  4 mg Intravenous Q6H PRN Kirby Crigler, Mir M, MD       oxyCODONE (Oxy IR/ROXICODONE) immediate release tablet 2.5-5 mg  2.5-5 mg Oral Q4H PRN Kirby Crigler, Mir M, MD   5 mg at 01/30/23 0435   polyethylene glycol (MIRALAX / GLYCOLAX) packet 17 g  17 g Oral Daily Kirby Crigler, Mir M, MD   17 g at 01/30/23 1029   sacubitril-valsartan (ENTRESTO) 24-26 mg per tablet  1 tablet Oral BID Maryln Gottron, MD   1 tablet at 01/30/23 1028   sertraline (ZOLOFT) tablet 50 mg  50 mg Oral Daily Kirby Crigler, Mir M, MD   50 mg at 01/30/23  1029     Discharge Medications: Please see discharge summary for a list of discharge medications.  Relevant Imaging Results:  Relevant Lab Results:   Additional Information SS#[id number]  Jehan Ranganathan, Olegario Messier, RN

## 2023-01-31 DIAGNOSIS — N3000 Acute cystitis without hematuria: Secondary | ICD-10-CM | POA: Diagnosis not present

## 2023-01-31 LAB — URINE CULTURE

## 2023-01-31 MED ORDER — TORSEMIDE 10 MG PO TABS
10.0000 mg | ORAL_TABLET | Freq: Every day | ORAL | Status: DC
  Administered 2023-01-31 – 2023-02-02 (×3): 10 mg via ORAL
  Filled 2023-01-31 (×3): qty 1

## 2023-01-31 NOTE — Progress Notes (Signed)
[facility] PROGRESS NOTE  MONTRICE MONTUORI (DOB: May 07, 1941) ZOX:[id number] PCP: Vivien Presto, MD  Brief Narrative: Tamara Bullock is an 82 y.o. female with a history of recent hip fracture s/p ORIF 4/15, stage IIIa CKD, OSA, HTN, HLD, CVA, CHF, hypothyroidism, depression and anxiety who presented to the ED from SNF on 01/28/2023 with abdominal pain found to have UTI. IV antibiotics have been given pending urine culture result from replaced foley on 5/1.   Subjective: Feeling weak, more weak than yesterday. Abd discomfort stable. On bedpan for BM now. Daughter at bedside. No other complaints at this time.  Objective: BP (!) 178/63   Pulse 72   Temp 98.5 F (36.9 C) (Oral)   Resp 17   Ht 5\' 3"  (1.6 m)   Wt 57.8 kg   SpO2 100%   BMI 22.57 kg/m   Gen: Elderly frail female in no distress Pulm: Clear, nonlabored  CV: RRR GI: Soft, NT, ND, +BS, no palpable stool in LLQ. Neuro: Alert and oriented. No new focal deficits. Ext: Warm, no deformities. Skin: No rashes, lesions or ulcers on visualized skin   Assessment & Plan: UTI: Has defervesced, no leukocytosis - Exchanged foley catheter 5/1 (last placed 4/18) and sent urine culture from new catheter. This is still pending. - Continue ceftriaxone for now pending urine cultures.  - Pursue trial of voiding after treatment of UTI.  History of right hip fracture s/p ORIF:  - Continue PT/OT while admitted and plan return to SNF for ongoing rehabilitation at [place]. Anticipate stability for discharge 5/3, TOC aware.    Constipation, stercoral colitis: Abd benign.  - Limit narcotics - Continue regular bowel regimen.   Depression, anxiety:  - Continue zoloft, prn lorazepam.   History of CVA, HLD:  - Continue plavix, statin, zetia  Chronic combined HFrEF, HTN: Euvolemic currently.  - Continue coreg with holding parameters (bradycardia), entresto (BP stable).  - Restart torsemide with improved po intake, stable vitals and  BMP.  Hypothyroidism: No symptoms. - Continue synthroid  Stage IIIa CKD: Near baseline. Has been having  - Avoid nephrotoxins  AOCD: Hgb up suggesting marrow response to recent fracture/surgery. Required 2u PRBCs last admission. - Monitor intermittently.  Tyrone Nine, MD [facility] [email or web address] 01/31/2023, 10:49 AM

## 2023-02-01 DIAGNOSIS — E039 Hypothyroidism, unspecified: Secondary | ICD-10-CM | POA: Diagnosis not present

## 2023-02-01 DIAGNOSIS — L8990 Pressure ulcer of unspecified site, unspecified stage: Secondary | ICD-10-CM | POA: Insufficient documentation

## 2023-02-01 DIAGNOSIS — F05 Delirium due to known physiological condition: Secondary | ICD-10-CM | POA: Diagnosis not present

## 2023-02-01 DIAGNOSIS — N3000 Acute cystitis without hematuria: Secondary | ICD-10-CM | POA: Diagnosis not present

## 2023-02-01 DIAGNOSIS — E785 Hyperlipidemia, unspecified: Secondary | ICD-10-CM

## 2023-02-01 DIAGNOSIS — I639 Cerebral infarction, unspecified: Secondary | ICD-10-CM

## 2023-02-01 DIAGNOSIS — I5042 Chronic combined systolic (congestive) and diastolic (congestive) heart failure: Secondary | ICD-10-CM

## 2023-02-01 DIAGNOSIS — F32A Depression, unspecified: Secondary | ICD-10-CM

## 2023-02-01 MED ORDER — LORAZEPAM 0.5 MG PO TABS
0.2500 mg | ORAL_TABLET | Freq: Three times a day (TID) | ORAL | Status: DC | PRN
  Administered 2023-02-01 – 2023-02-02 (×3): 0.25 mg via ORAL
  Filled 2023-02-01 (×3): qty 1

## 2023-02-01 MED ORDER — LORAZEPAM 1 MG PO TABS
1.0000 mg | ORAL_TABLET | Freq: Once | ORAL | Status: AC
  Administered 2023-02-01: 1 mg via ORAL
  Filled 2023-02-01: qty 1

## 2023-02-01 NOTE — Care Management Important Message (Signed)
Important Message  Patient Details IM Letter given. Name: Tamara Bullock MRN: [id number] Date of Birth: 1941/05/15   Medicare Important Message Given:  Yes     Caren Macadam 02/01/2023, 10:28 AM

## 2023-02-01 NOTE — Progress Notes (Signed)
Physical Therapy Treatment Patient Details Name: Tamara Bullock MRN: [id number] DOB: [birth year] Today's Date: 02/01/2023   History of Present Illness 82 yo female admitted from SNF with UTI. Recent R hip IM nailing  01/14/23. Hx of fall, CKD, CVA, hypothyroidism, OSA    PT Comments     Pt admitted with above diagnosis.  Pt currently with functional limitations due to the deficits listed below (see PT Problem List). Pt in bed when PT arrived. Daughter had recently left. Pt required multimodal cues and extensive assist for all functional mobility tasks. Pt is self limiting R LE WB and unable to advance R LE when in assisted standing with RW or with PT assist standing in front of pt. Pt is unable to rate or describe pain on numeric scale with noted guarding of R LE. Pt returned to bed, all needs met and in L side lying propped with use of pillows per nursing request.  Pt will benefit from acute skilled PT in current and next venue to increase their independence and safety with mobility.   Recommendations for follow up therapy are one component of a multi-disciplinary discharge planning process, led by the attending physician.  Recommendations may be updated based on patient status, additional functional criteria and insurance authorization.  Follow Up Recommendations  Can patient physically be transported by private vehicle: No    Assistance Recommended at Discharge Frequent or constant Supervision/Assistance  Patient can return home with the following Two people to help with walking and/or transfers;Assistance with cooking/housework;Assist for transportation;Help with stairs or ramp for entrance;Direct supervision/assist for medications management;Direct supervision/assist for financial management;A lot of help with bathing/dressing/bathroom   Equipment Recommendations  None recommended by PT    Recommendations for Other Services       Precautions / Restrictions Precautions Precautions:  Fall Restrictions Weight Bearing Restrictions: No RLE Weight Bearing: Weight bearing as tolerated     Mobility  Bed Mobility Overal bed mobility: Needs Assistance Bed Mobility: Supine to Sit, Sit to Supine     Supine to sit: HOB elevated, Max assist Sit to supine: HOB elevated, Mod assist   General bed mobility comments: pt required cues, assist with bed pad to scoot to EOB and toward HOB, pt able to initiate sit to supine    Transfers Overall transfer level: Needs assistance Equipment used: Rolling walker (2 wheels) Transfers: Sit to/from Stand Sit to Stand: From elevated surface, Mod assist, +2 physical assistance           General transfer comment: trial with STS from EOB to RW with pt pull to stand and pt self limiting R LE WB and unable to advance R LE forward, narrow BOS and strong posterior pushing response. trail for STS from EOB with PT standing in front of pt in attempts to address apparent fear of falling and no indication of improved ability for extension posture, R LE placement or WB    Ambulation/Gait               General Gait Details: NT due to safety and required assist   Stairs             Wheelchair Mobility    Modified Rankin (Stroke Patients Only)       Balance Overall balance assessment: Needs assistance Sitting-balance support: Bilateral upper extremity supported, Feet supported Sitting balance-Leahy Scale: Fair Sitting balance - Comments: trunk flexion noted   Standing balance support: Bilateral upper extremity supported, Reliant on assistive device for balance Standing balance-Leahy  Scale: Poor                              Cognition Arousal/Alertness: Awake/alert Behavior During Therapy: WFL for tasks assessed/performed Overall Cognitive Status: Difficult to assess Area of Impairment: Following commands, Safety/judgement, Memory, Problem solving                 Orientation Level: Disoriented to,  Place, Time, Situation   Memory: Decreased short-term memory Following Commands: Follows one step commands consistently Safety/Judgement: Decreased awareness of safety, Decreased awareness of deficits   Problem Solving: Requires verbal cues General Comments: Expressive difficulties. Impulsive        Exercises      General Comments General comments (skin integrity, edema, etc.): daughter left prior to PT tx session      Pertinent Vitals/Pain Pain Assessment Pain Assessment: Faces Faces Pain Scale: Hurts even more Pain Location: R hip Pain Descriptors / Indicators: Guarding, Grimacing Pain Intervention(s): Limited activity within patient's tolerance, Monitored during session, Repositioned    Home Living Family/patient expects to be discharged to:: Skilled nursing facility Living Arrangements: Spouse/significant other Available Help at Discharge: Family;Available PRN/intermittently Type of Home: Independent living facility Home Access: Level entry       Home Layout: One level Home Equipment: Rollator (4 wheels);Cane - single point;Crutches;Shower seat Additional Comments: 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 but prior both were indep with A/IADL's    Prior Function            PT Goals (current goals can now be found in the care plan section) Acute Rehab PT Goals Patient Stated Goal: none PT Goal Formulation: With family Time For Goal Achievement: 02/12/23 Potential to Achieve Goals: Good    Frequency    Min 1X/week      PT Plan      Co-evaluation              AM-PAC PT "6 Clicks" Mobility   Outcome Measure  Help needed turning from your back to your side while in a flat bed without using bedrails?: A Lot Help needed moving from lying on your back to sitting on the side of a flat bed without using bedrails?: A Lot Help needed moving to and from a bed to a chair (including a wheelchair)?: A Lot Help needed standing up  from a chair using your arms (e.g., wheelchair or bedside chair)?: A Lot Help needed to walk in hospital room?: Total Help needed climbing 3-5 steps with a railing? : Total 6 Click Score: 10    End of Session Equipment Utilized During Treatment: Gait belt Activity Tolerance: Patient limited by fatigue;Patient limited by pain;Patient limited by lethargy (nurse reported pt was demonstrating percived behaviors in am and administered ativan and haldol) Patient left: in bed;with call bell/phone within reach;with bed alarm set Nurse Communication: Mobility status PT Visit Diagnosis: Muscle weakness (generalized) (M62.81);History of falling (Z91.81);Difficulty in walking, not elsewhere classified (R26.2);Pain Pain - Right/Left: Right Pain - part of body: Hip     Time: 1610-9604 PT Time Calculation (min) (ACUTE ONLY): 19 min  Charges:  $Therapeutic Activity: 8-22 mins                     Rica Mote, PT    Jacqualyn Posey 02/01/2023, 2:49 PM

## 2023-02-01 NOTE — Progress Notes (Signed)
Occupational Therapy Treatment Patient Details Name: CAROLL PRIMMER MRN: [id number] DOB: 1941-02-03 Today's Date: 02/01/2023   History of present illness 82 yo female admitted from SNF with UTI. Recent R hip IM nailing  01/14/23. Hx of fall, CKD, CVA, hypothyroidism, OSA   OT comments  Pt required increased time and effort for most tasks. She presented with R hip guarding and pain responses with activity, occasional posterior leaning seated EOB, and decreased activity tolerance. She required intermittent cues and assist to correct posterior lean EOB, while she was simultaneously assisted with grooming and self-feeding EOB.  She needed increased assist x2 to stand using a RW, and she subsequently presented with decreased standing tolerance and impaired standing balance. Per her daughter, who was present during the session the pt's current cognition is not reflective of her baseline, as she was noted to be with delayed initiation, short term memory, and occasional confusion. Without further OT services, the pt is at high risk for progressive weakness & gross functional decline.    Recommendations for follow up therapy are one component of a multi-disciplinary discharge planning process, led by the attending physician.  Recommendations may be updated based on patient status, additional functional criteria and insurance authorization.    Assistance Recommended at Discharge Frequent or constant Supervision/Assistance  Patient can return home with the following  A lot of help with bathing/dressing/bathroom;Two people to help with walking and/or transfers;Direct supervision/assist for medications management;Help with stairs or ramp for entrance;Assist for transportation;Direct supervision/assist for financial management;Assistance with cooking/housework   Equipment Recommendations  BSC/3in1       Precautions / Restrictions Precautions Precautions: Fall Restrictions Weight Bearing Restrictions: No RLE  Weight Bearing: Weight bearing as tolerated       Mobility Bed Mobility Overal bed mobility: Needs Assistance Bed Mobility: Supine to Sit, Sit to Supine     Supine to sit: HOB elevated, Max assist Sit to supine: Max assist   General bed mobility comments: She required increased time and effort, as well as cues for initiation and sequencing, including advancing BLE, reaching for bed rail, and pulling on bedrail    Transfers Overall transfer level: Needs assistance Equipment used: Rolling walker (2 wheels) Transfers: Sit to/from Stand Sit to Stand: +2 physical assistance, +2 safety/equipment, Mod assist, From elevated surface           General transfer comment: Pt required increased verbal and tactile cues for increasing BLE base of support, hand placement on the walker, and trunk extension in standing         ADL either performed or assessed with clinical judgement   ADL Overall ADL's : Needs assistance/impaired Eating/Feeding: Sitting;Minimal assistance Eating/Feeding Details (indicate cue type and reason): Pt drank from a cup while seated EOB. She required cues for initiation of tasks, as well as to reach forward to grasp the cup, then bring it to her mouth. Grooming: Moderate assistance;Sitting;Cueing for sequencing Grooming Details (indicate cue type and reason): Pt performed hair combing seated EOB. She required assist and cues to maintain sitting balance, given occasional posterior leaning, as well as assistance for access and thoroughness with tasks.         Upper Body Dressing : Maximal assistance;Sitting Upper Body Dressing Details (indicate cue type and reason): based on clinical judgement Lower Body Dressing: Total assistance                            Pertinent Vitals/ Pain  Pain Assessment Pain Assessment: Faces Pain Score: 6  Pain Location: R hip Pain Intervention(s): Limited activity within patient's tolerance, Patient requesting pain  meds-RN notified, Repositioned  Home Living Family/patient expects to be discharged to:: Skilled nursing facility Living Arrangements: Spouse/significant other Available Help at Discharge: Family;Available PRN/intermittently Type of Home: Independent living facility Home Access: Level entry     Home Layout: One level     Bathroom Shower/Tub: Estate manager/land agent Accessibility: Yes   Home Equipment: Rollator (4 wheels);Cane - single point;Crutches;Shower seat          Frequency  Min 2X/week        Progress Toward Goals  OT Goals(current goals can now be found in the care plan section)     Acute Rehab OT Goals OT Goal Formulation: With patient/family Time For Goal Achievement: 02/13/23 Potential to Achieve Goals: Fair  Plan Discharge plan remains appropriate       AM-PAC OT "6 Clicks" Daily Activity     Outcome Measure   Help from another person eating meals?: A Little Help from another person taking care of personal grooming?: A Lot Help from another person toileting, which includes using toliet, bedpan, or urinal?: Total Help from another person bathing (including washing, rinsing, drying)?: A Lot Help from another person to put on and taking off regular upper body clothing?: A Lot Help from another person to put on and taking off regular lower body clothing?: Total 6 Click Score: 11    End of Session Equipment Utilized During Treatment: Gait belt;Rolling walker (2 wheels)  OT Visit Diagnosis: Muscle weakness (generalized) (M62.81);Pain   Activity Tolerance Patient limited by pain;Patient limited by lethargy;Patient limited by fatigue; Patient was also limited by impaired cognition)   Patient Left in bed;with call bell/phone within reach;with bed alarm set;with family/visitor present   Nurse Communication Mobility status        Time: 1429-1501 OT Time Calculation (min): 32 min  Charges: OT General Charges $OT Visit: 1 Visit OT  Treatments $Self Care/Home Management : 8-22 mins $Therapeutic Activity: 8-22 mins      Reuben Likes, OTR/L 02/01/2023, 3:30 PM

## 2023-02-01 NOTE — Progress Notes (Signed)
[facility] PROGRESS NOTE  Tamara Bullock (DOB: [birth year]) ZOX:[id number] PCP: Vivien Presto, MD  Brief Narrative: Tamara Bullock is an 82 y.o. female with a history of recent hip fracture s/p ORIF 4/15, stage IIIa CKD, OSA, HTN, HLD, CVA, CHF, hypothyroidism, depression and anxiety who presented to the ED from SNF on 01/28/2023 with abdominal pain found to have UTI. IV antibiotics have been given pending urine culture result from replaced foley on 5/1.   Subjective: No acute issues or events overnight, daughter at bedside, patient somewhat somnolent in setting of recent as needed medication for agitation  Objective: BP (!) 161/67 (BP Location: Left Arm)   Pulse 69   Temp 98.2 F (36.8 C) (Axillary)   Resp 15   Ht 5\' 3"  (1.6 m)   Wt 57.8 kg   SpO2 99%   BMI 22.57 kg/m   Gen: Elderly frail female in no distress Pulm: Clear, nonlabored  CV: RRR GI: Soft, NT, ND, +BS, no palpable stool in LLQ. Neuro: Alert and oriented. No new focal deficits. Ext: Warm, no deformities. Skin: No rashes, lesions or ulcers on visualized skin   Assessment & Plan:  UTI: Has defervesced, no leukocytosis - Exchanged foley catheter 5/1 (last placed 4/18) and sent urine culture from new catheter. This is still pending. - Continue ceftriaxone for now pending urine cultures.  - Pursue trial of voiding after treatment of UTI.  History of right hip fracture s/p ORIF:  - Continue PT/OT while admitted and plan return to SNF for ongoing rehabilitation at [place]. Anticipate stability for discharge 5/3, TOC aware.    Constipation, stercoral colitis, chronic:  - Limit narcotics - Continue regular bowel regimen.   Uncontrolled depression, anxiety Exacerbated by advanced dementia likely underlying delirium/sundowning(POA) - Continue zoloft -Discontinue Haldol, scheduled low-dose lorazepam per previous med reconciliation due to concerns over polypharmacy as well as withdrawal -Patient has had worsening  mental status from baseline since recent surgery with extremely poor sleep/wake cycle and worsening confusion. - Discussed at length with daughter need to improve patient's sleep cycle given patient's age and dementia baseline  History of CVA, HLD:  - Continue plavix, statin, zetia  Chronic combined HFrEF, HTN:  - Continue coreg with holding parameters (bradycardia), entresto (BP stable).  - Restart torsemide with improved po intake, stable vitals and BMP.  Hypothyroidism: No symptoms. - Continue synthroid  Stage IIIa CKD: Near baseline. Has been having  - Avoid nephrotoxins  Anemia of chronic disease: Hgb up suggesting marrow response to recent fracture/surgery. Required 2u PRBCs last admission. - Monitor intermittently.  Azucena Fallen, DO [facility] [email or web address] 02/01/2023, 12:59 PM

## 2023-02-01 NOTE — TOC Progression Note (Signed)
Transition of Care [facility]) - Progression Note    Patient Details  Name: Tamara Bullock MRN: [id number] Date of Birth: 1941/05/26  Transition of Care [facility]) CM/SW Contact  Arty Lantzy, Olegario Messier, RN Phone Number: 02/01/2023, 11:47 AM  Clinical Narrative: Georgena Spurling through [facility] pending Berkley Harvey WU#[id number] Berkley Harvey for [facility]) for return back to [facility] SNF.MD notified.      Expected Discharge Plan: Skilled Nursing Facility Barriers to Discharge: Insurance Authorization  Expected Discharge Plan and Services   Discharge Planning Services: CM Consult Post Acute Care Choice: Skilled Nursing Facility Living arrangements for the past 2 months: Skilled Nursing Facility                                       Social Determinants of Health (SDOH) Interventions SDOH Screenings   Food Insecurity: No Food Insecurity (01/29/2023)  Housing: Low Risk  (01/29/2023)  Transportation Needs: No Transportation Needs (01/29/2023)  Utilities: Not At Risk (01/29/2023)  Depression (PHQ2-9): Low Risk  (05/17/2022)  Recent Concern: Depression (PHQ2-9) - High Risk (04/04/2022)  Tobacco Use: Low Risk  (01/28/2023)    Readmission Risk Interventions     No data to display

## 2023-02-02 DIAGNOSIS — E785 Hyperlipidemia, unspecified: Secondary | ICD-10-CM | POA: Diagnosis not present

## 2023-02-02 DIAGNOSIS — I10 Essential (primary) hypertension: Secondary | ICD-10-CM

## 2023-02-02 DIAGNOSIS — N1831 Chronic kidney disease, stage 3a: Secondary | ICD-10-CM

## 2023-02-02 DIAGNOSIS — I639 Cerebral infarction, unspecified: Secondary | ICD-10-CM | POA: Diagnosis not present

## 2023-02-02 DIAGNOSIS — L89159 Pressure ulcer of sacral region, unspecified stage: Secondary | ICD-10-CM

## 2023-02-02 DIAGNOSIS — G9341 Metabolic encephalopathy: Secondary | ICD-10-CM

## 2023-02-02 DIAGNOSIS — N3000 Acute cystitis without hematuria: Secondary | ICD-10-CM | POA: Diagnosis not present

## 2023-02-02 MED ORDER — LOPERAMIDE HCL 2 MG PO CAPS: 2.0000 mg | ORAL_CAPSULE | ORAL | 0 refills | Status: AC | PRN

## 2023-02-02 MED ORDER — LOPERAMIDE HCL 2 MG PO CAPS
2.0000 mg | ORAL_CAPSULE | ORAL | Status: DC | PRN
  Administered 2023-02-02: 2 mg via ORAL
  Filled 2023-02-02: qty 1

## 2023-02-02 MED ORDER — LORAZEPAM 0.5 MG PO TABS: 0.2500 mg | ORAL_TABLET | Freq: Three times a day (TID) | ORAL | 0 refills | Status: AC | PRN

## 2023-02-02 MED ORDER — MIRTAZAPINE 7.5 MG PO TABS: 7.5000 mg | ORAL_TABLET | Freq: Every day | ORAL | 0 refills | Status: AC

## 2023-02-02 NOTE — Progress Notes (Deleted)
[facility] PROGRESS NOTE  Tamara Bullock (DOB: 09/09/1941) RUE:[id number] PCP: Vivien Presto, MD  Brief Narrative: Tamara Bullock is an 82 y.o. female with a history of recent hip fracture s/p ORIF 4/15, stage IIIa CKD, OSA, HTN, HLD, CVA, CHF, hypothyroidism, depression and anxiety who presented to the ED from SNF on 01/28/2023 with abdominal pain found to have UTI. IV antibiotics have been given pending urine culture result from replaced foley on 5/1.   Patient remains medically stable for discharge back to SNF.  Awaiting insurance reauthorization to return to prior facility.  Subjective: No acute issues or events overnight, daughter at bedside, patient much more alert and responsive today appears to be "back to baseline" per daughter  Objective: BP (!) 145/57 (BP Location: Right Arm)   Pulse (!) 55   Temp 98.5 F (36.9 C) (Oral)   Resp 16   Ht 5\' 3"  (1.6 m)   Wt 57.8 kg   SpO2 96%   BMI 22.57 kg/m   Gen: Elderly frail female in no distress Pulm: Clear, nonlabored  CV: RRR GI: Soft, NT, ND, +BS, no palpable stool in LLQ. Neuro: Alert and oriented. No new focal deficits. Ext: Warm, no deformities. Skin: No rashes, lesions or ulcers on visualized skin   Assessment & Plan:  UTI: Has defervesced, no leukocytosis - Exchanged foley catheter 5/1 (last placed 4/18) and sent urine culture from new catheter. This is still pending. - Continue ceftriaxone for now pending urine cultures.  - Pursue trial of voiding after treatment of UTI.  History of right hip fracture s/p ORIF:  - Continue PT/OT while admitted and plan return to SNF for ongoing rehabilitation at DC. Anticipate stability for discharge 5/3, TOC aware.    Constipation, stercoral colitis, chronic:  - Limit narcotics - Continue regular bowel regimen.   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  Hospital delirium versus sundowning was likely playing a major role in her acute emotional outbursts.  History of CVA, HLD:  - Continue plavix, statin, zetia  Chronic combined HFrEF, HTN:  - Continue coreg and entresto (with holding parameters for bradycardia and hypotension respectively).  -Continue torsemide with improved po intake, stable vitals and BMP.  Hypothyroidism: - Continue synthroid  Stage IIIa CKD: Near baseline. Has been having  - Avoid nephrotoxins  Anemia of chronic disease: Hgb up from prior suggesting marrow response to recent fracture/surgery. Required 2u PRBCs last admission. - Monitor intermittently.  Azucena Fallen, DO [facility] [email or web address] 02/02/2023, 7:47 AM

## 2023-02-02 NOTE — Progress Notes (Signed)
Called [facility], where pt will return. Was informed Waynetta Sandy would be receiving nurse, & transferred to her phone. No answer, & after several rings, went directly to voicemail. Left message with documenting RN's return number, requested call.

## 2023-02-02 NOTE — Progress Notes (Signed)
Daughter refused pt to be turned at this time. Pt is sleeping & does appear very comfortable for first time today

## 2023-02-02 NOTE — Progress Notes (Signed)
Called report to [employer] LPN, @ [facility]. Stated she was the nurse, who sent her to the ED for this admission. Therefore, had an understanding of admitting diagnosis. Beth confirmed having understanding of all information & denied having any further questions @ this time. Per Child psychotherapist, time of ETA for transportation is unknown, but called. Updated daughter, who is present at bedside.

## 2023-02-02 NOTE — TOC Transition Note (Addendum)
Transition of Care [facility]) - CM/SW Discharge Note   Patient Details  Name: Tamara Bullock MRN: [id number] Date of Birth: Jan 06, 1941  Transition of Care [facility]) CM/SW Contact:  Otelia Santee, LCSW Phone Number: 02/02/2023, 2:30 PM   Clinical Narrative:    Pt is to return to [facility] for ST-S[facility]. Pt will be going to room 32. RN to call report to [phone]. Spoke w/ pt's daughter who is agreeable to transfer plans. Pt will be transported to facility via [facility]. DC packet made and placed at nursing station.  [facility] called at 3:45pm.  Final next level of care: Skilled Nursing Facility Barriers to Discharge: Barriers Resolved   Patient Goals and CMS Choice CMS Medicare.gov Compare Post Acute Care list provided to:: Patient Represenative (must comment) (Trina(dtr)) Choice offered to / list presented to : Adult Children  Discharge Placement     Existing PASRR number confirmed : 01/30/23          Patient chooses bed at: [facility] Patient to be transferred to facility by: [facility] Name of family member notified: Daughter, Darreld Mclean Patient and family notified of of transfer: 02/02/23  Discharge Plan and Services Additional resources added to the After Visit Summary for     Discharge Planning Services: CM Consult Post Acute Care Choice: Skilled Nursing Facility          DME Arranged: N/A DME Agency: NA                  Social Determinants of Health (SDOH) Interventions SDOH Screenings   Food Insecurity: No Food Insecurity (01/29/2023)  Housing: Low Risk  (01/29/2023)  Transportation Needs: No Transportation Needs (01/29/2023)  Utilities: Not At Risk (01/29/2023)  Depression (PHQ2-9): Low Risk  (05/17/2022)  Recent Concern: Depression (PHQ2-9) - High Risk (04/04/2022)  Tobacco Use: Low Risk  (01/28/2023)     Readmission Risk Interventions    02/02/2023    2:26 PM  Readmission Risk Prevention Plan  Transportation Screening Complete  PCP or Specialist Appt within  5-7 Days Complete  Home Care Screening Complete  Medication Review (RN CM) Complete

## 2023-02-02 NOTE — Discharge Summary (Signed)
Physician Discharge Summary  Tamara Bullock ZOX:[id number] DOB: November 27, 1940 DOA: 01/28/2023  PCP: Vivien Presto, MD  Admit date: 01/28/2023 Discharge date: 02/02/2023  Admitted From: SNF Disposition: Same  Recommendations for Outpatient Follow-up:  Follow up with PCP in 1-2 weeks  Discharge Condition: Stable CODE STATUS: Full Diet recommendation: As tolerated  Brief/Interim Summary: Tamara Bullock is an 82 y.o. female with a history of recent hip fracture s/p ORIF 4/15, stage IIIa CKD, OSA, HTN, HLD, CVA, CHF, hypothyroidism, depression and anxiety who presented to the ED from SNF on 01/28/2023 with abdominal pain found to have UTI. IV antibiotics have been given pending urine culture result from replaced foley on 5/1.  Patient has now completed antibiotic course and is stable, family is agreeable to return back to previous facility.  Patient's discharge was delayed due to insurance need for preauthorization to return back to prior facility which has been completed today on the fourth.  She is otherwise stable for discharge.   Discharge Diagnoses:  Active Problems:   Essential hypertension   Stage 3a chronic kidney disease (CKD) (HCC)   Depression   Hypothyroidism   Chronic combined systolic and diastolic CHF (congestive heart failure) (HCC)   History of stroke   Hyperlipidemia   UTI (urinary tract infection)   Pressure injury of skin  UTI: Resolved; Foley weaning per urology in the outpatient setting   History of right hip fracture s/p ORIF:  -Continue PT OT at SNF as previous.     Constipation, stercoral colitis, chronic:  - Limit narcotics - Continue regular bowel regimen.    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  Hospital delirium versus sundowning was likely playing a major role in her acute emotional outbursts.   History of CVA, HLD:  - Continue plavix, statin, zetia   Chronic combined HFrEF, HTN:  - Continue coreg and entresto (with holding parameters for bradycardia and hypotension respectively).  -Continue torsemide with improved po intake, stable vitals and BMP.   Hypothyroidism: - Continue synthroid   Stage IIIa CKD: Near baseline.  Urine output appropriate - Avoid nephrotoxins   Anemia of chronic disease: Hgb up from prior suggesting marrow response to recent fracture/surgery. Required 2u PRBCs last admission. - Monitor intermittently.  Discharge Instructions   Allergies as of 02/02/2023       Reactions   Diphenhydramine Hcl Shortness Of Breath   Dermatologicals-iv administration was painful until diluted; can take liquid in small doses by mouth.   Moxifloxacin Hcl In Nacl    Other reaction(s): Other (see comments) Fluoroquinolones-short term stomach and chest pain   Penicillin G Hives   Cephalosporin tolerant. Received ancef 2019 during back surgery    Sulfa Antibiotics Hives   Colistin    Other reaction(s): Other (see comments)   Diclofenac Sodium    Other reaction(s): Other Analgesics-Anti-inflammatory   Esomeprazole    Other reaction(s): Other (see comments)   Estradiol    Other reaction(s): Other (see comments)   Medroxyprogesterone Acetate    Other reaction(s): Other (see comments) Contraceptives   Neomycin-polymyxin-hc  Other reaction(s): Other Eye drops   Nitrofurantoin    Other reaction(s): Other Anti-infectives   Nsaids    Other reaction(s): Other (see comments) Can't tolerate OTC  anit-inflammatories-can tolerate advil for a short periods   Prednisone    Other reaction(s): Other (see comments) Eye drop   Proparacaine    Other reaction(s): Other (see comments) Used at eye doctor   Rabeprazole    Other reaction(s): Other (see comments) Ulcer drugs-stomach/chest pain, pasty yellow stools   Amitriptyline    Antidepressants   Aspirin    unknown   Azithromycin    unknown   Carbocaine [mepivacaine]    unknown   Cimetidine    unknown   Ciprofloxacin    unknown   Cisapride    unknown   Clindamycin/lincomycin    unknown   Cortisporin-tc [neomycin-colist-hc-thonzonium]    unknown   Escitalopram    unknown   Hydrochlorothiazide    unknown   Hyoscyamine    Ulcer drugs   Ibuprofen    unknown   Iodine    unknown   Medroxyprogesterone    unknown   Meloxicam    Minocycline    Tetracyclines   Moxifloxacin    Chest pain    Nitrofuran Derivatives    unknown   Ofloxacin    unknown   Pantoprazole    unknown   Penicillins    unknown   Pneumococcal Vaccines    Prednisolone    unknown   Prevacid [lansoprazole]    Propoxyphene    -N 100 -Analgesics-opiod   Soap    unknown   Spironolactone    unknown   Tobradex [tobramycin-dexamethasone]    unknown   Trolamine Salicylate    unknown   Verapamil    unknown   Voltaren [diclofenac]    unknown   Diphenhydramine    Other reaction(s): Other (see comments)   Hydrocodone    Other reaction(s): Other (see comments)   Tramadol    Other reaction(s): Other (see comments)        Medication List     STOP taking these medications    methocarbamol 500 MG tablet Commonly known as: ROBAXIN       TAKE these medications    acetaminophen 325 MG tablet Commonly known as: TYLENOL Take 2 tablets (650 mg total) by mouth every 6 (six) hours as needed. What changed: reasons to take this   atorvastatin 80 MG tablet Commonly known as: LIPITOR Take 1 tablet (80 mg total) by mouth daily. What changed:  when to take this   carvedilol 3.125 MG tablet Commonly known as: COREG Take 1 tablet (3.125 mg total) by mouth 2 (two) times daily with a meal.   clopidogrel 75 MG tablet Commonly known as: PLAVIX Take 1 tablet (75 mg total) by mouth daily.   cyanocobalamin 1000 MCG/ML injection Commonly known as: VITAMIN B12 Inject 1,000 mcg into the muscle daily.   docusate sodium 100 MG capsule Commonly known as: COLACE Take 1 capsule (100 mg total) by mouth 2 (two) times daily.   Entresto 24-26 MG Generic drug: sacubitril-valsartan Take 1 tablet by mouth 2 (two) times daily.   Euthyrox 50 MCG tablet Generic drug: levothyroxine Take 50 mcg by mouth daily.   ezetimibe 10 MG tablet Commonly known as: Zetia Take 1 tablet (10 mg total) by mouth daily.   loperamide 2 MG capsule Commonly known as: IMODIUM Take 1 capsule (2 mg total) by mouth as needed for diarrhea or loose stools.  LORazepam 0.5 MG tablet Commonly known as: ATIVAN Take 0.5 tablets (0.25 mg total) by mouth every 8 (eight) hours as needed for anxiety. What changed:  how much to take when to take this   mirtazapine 7.5 MG tablet Commonly known as: REMERON Take 1 tablet (7.5 mg total) by mouth at bedtime.   ondansetron 4 MG disintegrating tablet Commonly known as: ZOFRAN-ODT Take 4 mg by mouth every 6 (six) hours as needed for vomiting or nausea.   oxyCODONE 5 MG immediate release tablet Commonly known as: Oxy IR/ROXICODONE Take 0.5-1 tablets (2.5-5 mg total) by mouth every 4 (four) hours as needed for moderate pain or severe pain.   polyethylene glycol 17 g packet Commonly known as: MIRALAX / GLYCOLAX Take 17 g by mouth daily as needed for mild constipation. What changed: when to take this   sertraline 50 MG tablet Commonly known as: ZOLOFT Take 50 mg by mouth daily.   torsemide 10 MG tablet Commonly known as: DEMADEX Take 10 mg by mouth daily.   Vitamin D3 50 MCG (2000 UT) capsule Take 2,000 Units by  mouth daily.        Allergies  Allergen Reactions   Diphenhydramine Hcl Shortness Of Breath    Dermatologicals-iv administration was painful until diluted; can take liquid in small doses by mouth.   Moxifloxacin Hcl In Nacl     Other reaction(s): Other (see comments) Fluoroquinolones-short term stomach and chest pain   Penicillin G Hives    Cephalosporin tolerant. Received ancef 2019 during back surgery    Sulfa Antibiotics Hives   Colistin     Other reaction(s): Other (see comments)   Diclofenac Sodium     Other reaction(s): Other Analgesics-Anti-inflammatory   Esomeprazole     Other reaction(s): Other (see comments)   Estradiol     Other reaction(s): Other (see comments)   Medroxyprogesterone Acetate     Other reaction(s): Other (see comments) Contraceptives   Neomycin-Polymyxin-Hc     Other reaction(s): Other Eye drops   Nitrofurantoin     Other reaction(s): Other Anti-infectives   Nsaids     Other reaction(s): Other (see comments) Can't tolerate OTC anit-inflammatories-can tolerate advil for a short periods   Prednisone     Other reaction(s): Other (see comments) Eye drop   Proparacaine     Other reaction(s): Other (see comments) Used at eye doctor   Rabeprazole     Other reaction(s): Other (see comments) Ulcer drugs-stomach/chest pain, pasty yellow stools   Amitriptyline     Antidepressants   Aspirin     unknown   Azithromycin     unknown   Carbocaine [Mepivacaine]     unknown   Cimetidine     unknown   Ciprofloxacin     unknown   Cisapride     unknown   Clindamycin/Lincomycin     unknown   Cortisporin-Tc [Neomycin-Colist-Hc-Thonzonium]     unknown   Escitalopram     unknown   Hydrochlorothiazide     unknown   Hyoscyamine     Ulcer drugs   Ibuprofen     unknown   Iodine     unknown   Medroxyprogesterone     unknown   Meloxicam    Minocycline     Tetracyclines   Moxifloxacin     Chest pain    Nitrofuran Derivatives     unknown    Ofloxacin     unknown   Pantoprazole     unknown   Penicillins  unknown   Pneumococcal Vaccines    Prednisolone     unknown   Prevacid [Lansoprazole]    Propoxyphene     -N 100 -Analgesics-opiod   Soap     unknown   Spironolactone     unknown   Tobradex [Tobramycin-Dexamethasone]     unknown   Trolamine Salicylate     unknown   Verapamil     unknown   Voltaren [Diclofenac]     unknown   Diphenhydramine     Other reaction(s): Other (see comments)   Hydrocodone     Other reaction(s): Other (see comments)   Tramadol     Other reaction(s): Other (see comments)    Consultations: None Procedures/Studies: CT ABDOMEN PELVIS WO CONTRAST  Result Date: 01/29/2023 CLINICAL DATA:  History of recent colitis with persistent abdominal pain, initial encounter EXAM: CT ABDOMEN AND PELVIS WITHOUT CONTRAST TECHNIQUE: Multidetector CT imaging of the abdomen and pelvis was performed following the standard protocol without IV contrast. RADIATION DOSE REDUCTION: This exam was performed according to the departmental dose-optimization program which includes automated exposure control, adjustment of the mA and/or kV according to patient size and/or use of iterative reconstruction technique. COMPARISON:  01/23/2023 FINDINGS: Lower chest: Small pleural effusions are again identified. Bibasilar atelectatic changes are noted. Hepatobiliary: No focal liver abnormality is seen. No gallstones, gallbladder wall thickening, or biliary dilatation. Pancreas: Unremarkable. No pancreatic ductal dilatation or surrounding inflammatory changes. Spleen: Normal in size without focal abnormality. Adrenals/Urinary Tract: Adrenal glands are within normal limits. Kidneys show no renal calculi or obstructive changes. Mild perinephric stranding is again noted. The bladder is decompressed by Foley catheter. Stomach/Bowel: The previously seen perirectal inflammatory changes have improved in the interval from the prior exam.  Considerable decrease in presacral soft tissue edema is noted. No obstructive or inflammatory changes of the colon are seen. Scattered fecal material is noted. The appendix is within normal limits. Small bowel and stomach are unremarkable. Vascular/Lymphatic: Aortic atherosclerosis. No enlarged abdominal or pelvic lymph nodes. Reproductive: Status post hysterectomy. No adnexal masses. Other: No abdominal wall hernia or abnormality. No abdominopelvic ascites. Musculoskeletal: Postsurgical changes in the proximal right femur are noted. No acute bony abnormality is seen. Chronic T12 compression fracture is seen. IMPRESSION: Small bilateral pleural effusions. Interval improvement in the degree of proctitis when compared with the prior study. Some residual inflammatory changes noted. Postsurgical changes in the proximal right femur. Associated edema in the surrounding soft tissues is noted. Electronically Signed   By: Alcide Clever M.D.   On: 01/29/2023 01:57   DG Chest Portable 1 View  Result Date: 01/29/2023 CLINICAL DATA:  Weakness EXAM: PORTABLE CHEST 1 VIEW COMPARISON:  01/13/2023 FINDINGS: Cardiac shadow is within normal limits. Aortic calcifications are noted. Lungs are well aerated bilaterally without focal infiltrate. Mild left basilar atelectasis is noted. IMPRESSION: Mild left basilar atelectasis is noted. Electronically Signed   By: Alcide Clever M.D.   On: 01/29/2023 00:55   CT ABDOMEN PELVIS W CONTRAST  Result Date: 01/23/2023 CLINICAL DATA:  Constipation, ileus on recent x-ray. Abdominal pain for a couple days. Confusion. EXAM: CT ABDOMEN AND PELVIS WITH CONTRAST TECHNIQUE: Multidetector CT imaging of the abdomen and pelvis was performed using the standard protocol following bolus administration of intravenous contrast. RADIATION DOSE REDUCTION: This exam was performed according to the departmental dose-optimization program which includes automated exposure control, adjustment of the mA and/or kV  according to patient size and/or use of iterative reconstruction technique. CONTRAST:  75mL OMNIPAQUE IOHEXOL 350  MG/ML SOLN COMPARISON:  Abdominopelvic CT 01/13/2023 and radiographs. FINDINGS: Lower chest: Streaky atelectasis at both lung bases with interval development of trace bilateral pleural effusions. The heart size is stable. There is aortic and coronary artery atherosclerosis. Hepatobiliary: Heterogeneous peripheral low-density in the liver suggesting chronic passive congestion. No focal abnormalities are demonstrated on the delayed post-contrast images. No evidence of gallstones, gallbladder wall thickening or biliary dilatation. Pancreas: Unremarkable. No pancreatic ductal dilatation or surrounding inflammatory changes. Spleen: Normal in size without focal abnormality. Adrenals/Urinary Tract: Both adrenal glands appear normal. Punctate nonobstructing calculus in the upper pole of the left kidney and a small right renal cyst (for which no follow-up is recommended) are stable. No evidence of ureteral calculus or residual hydronephrosis. The bladder is decompressed by a Foley catheter. Stomach/Bowel: No enteric contrast administered. The stomach appears unremarkable for its degree of distention. There is no small bowel distension, wall thickening or surrounding inflammation. Mildly prominent stool throughout the colon, decreased in volume within the rectum over the interval. New mild rectal wall thickening with inflammatory changes in the perirectal fat. The colon otherwise appears unremarkable. The appendix appears normal. Vascular/Lymphatic: There are no enlarged abdominal or pelvic lymph nodes. Aortic and branch vessel atherosclerosis without evidence of aneurysm or large vessel occlusion. Reproductive: Hysterectomy.  No adnexal mass. Other: Intact abdominal wall. New trace ascites with perirectal soft tissue stranding and asymmetric edema throughout the subcutaneous fat, greater on the right. No  pneumoperitoneum. Musculoskeletal: Interval dynamic screw fixation of the previously demonstrated comminuted right femoral intertrochanteric fracture. The alignment of the fracture is improved. Unchanged T12 compression fracture no new fractures are identified. There is a small amount of hemorrhage within the subcutaneous fat and gluteus musculature in the upper right buttocks. Asymmetric subcutaneous edema in the right lateral abdominal wall and proximal right thigh. IMPRESSION: 1. Interval dynamic screw fixation of the previously demonstrated comminuted right femoral intertrochanteric fracture with improved alignment. No new fractures are identified. 2. Asymmetric subcutaneous edema in the right lateral abdominal wall and proximal right thigh with a small amount of hemorrhage in the subcutaneous fat and gluteus musculature in the upper right buttocks. 3. New mild rectal wall thickening with inflammatory changes in the perirectal fat, suspicious for stercoral colitis/proctitis. No evidence of bowel obstruction or perforation. 4. New trace bilateral pleural effusions with bibasilar atelectasis. 5. Heterogeneous peripheral low-density in the liver suggesting chronic passive congestion. 6.  Aortic Atherosclerosis (ICD10-I70.0). Electronically Signed   By: Carey Bullocks M.D.   On: 01/23/2023 13:20   US RENAL  Result Date: 01/16/2023 CLINICAL DATA:  [id number], with acute kidney injury. EXAM: RENAL / URINARY TRACT ULTRASOUND COMPLETE COMPARISON:  CT with IV contrast 01/13/2023 FINDINGS: Right Kidney: Renal measurements: 10.0 x 3.9 x 3.9 cm = volume: 79.4 mL. Echogenicity within normal limits. No mass, stones or hydronephrosis visualized. There was a 1.3 cm benign-appearing cyst in the inferior pole of this kidney on CT, but it was not visualized on this ultrasound. Left Kidney: Renal measurements: 9.5 x 5.0 x 3.8 cm = volume: 95.1 mL. Echogenicity within normal limits. No mass, stones or hydronephrosis visualized. The  CT did, however demonstrate two separate 1 mm caliceal stones in the superior pole of this kidney. Bladder: Appears normal for degree of bladder distention. Other: None. IMPRESSION: 1. No increased renal cortical echogenicity or hydronephrosis. 2. Two 1 mm nonobstructive stones in the upper pole left kidney and a 1.3 cm cyst in the right inferior pole on CT, are not redemonstrated by ultrasound.  Electronically Signed   By: Almira Bar M.D.   On: 01/16/2023 20:42   DG FEMUR PORT, MIN 2 VIEWS RIGHT  Result Date: 01/14/2023 CLINICAL DATA:  Right hip fracture EXAM: RIGHT FEMUR PORTABLE 2 VIEW COMPARISON:  01/13/2023 FINDINGS: Interval ORIF of a comminuted intertrochanteric fracture of the right femur. Long intramedullary rod with distal interlocking screws and proximal lag screw. Improved fracture alignment lesser trochanteric fragment is medially displaced. No new fractures. Hip and knee joint alignment is maintained. Osteoarthritis of the knee. Expected postoperative changes within the soft tissues. IMPRESSION: Interval ORIF of right femur fracture with improved fracture alignment. Electronically Signed   By: Duanne Guess D.O.   On: 01/14/2023 20:51   Pelvis Portable  Result Date: 01/14/2023 CLINICAL DATA:  Postop EXAM: PORTABLE PELVIS 1-2 VIEWS COMPARISON:  Preoperative radiograph FINDINGS: Long intramedullary nail with trans trochanteric and distal locking screw fixation traverse comminuted proximal femur fracture. Improved fracture alignment from preoperative imaging. Recent postsurgical change includes air and edema in the soft tissues. IMPRESSION: ORIF of comminuted proximal femur fracture. No immediate postoperative complication. Electronically Signed   By: Narda Rutherford M.D.   On: 01/14/2023 18:26   DG FEMUR, MIN 2 VIEWS RIGHT  Result Date: 01/14/2023 CLINICAL DATA:  ORIF EXAM: Intraoperative fluoroscopy COMPARISON:  Preop x-ray 05/15/2023 FINDINGS: Six fluoroscopic spot images submitted  for review demonstrate placement of dynamic hip screw with long intramedullary rod. Distal fixation screws. This transfixes the comminuted intertrochanteric hip fracture. Expected alignment. Imaging was obtained to aid in treatment. Please correlate with real-time fluoroscopy of 1 minute and 42 seconds. Cumulative dose 15.75 mGy IMPRESSION: Intraoperative fluoroscopy Electronically Signed   By: Karen Kays M.D.   On: 01/14/2023 16:55   DG C-Arm 1-60 Min-No Report  Result Date: 01/14/2023 Fluoroscopy was utilized by the requesting physician.  No radiographic interpretation.   DG C-Arm 1-60 Min-No Report  Result Date: 01/14/2023 Fluoroscopy was utilized by the requesting physician.  No radiographic interpretation.   CT CHEST ABDOMEN PELVIS W CONTRAST  Result Date: 01/13/2023 CLINICAL DATA:  Polytrauma, blunt EXAM: CT CHEST, ABDOMEN, AND PELVIS WITH CONTRAST TECHNIQUE: Multidetector CT imaging of the chest, abdomen and pelvis was performed following the standard protocol during bolus administration of intravenous contrast. RADIATION DOSE REDUCTION: This exam was performed according to the departmental dose-optimization program which includes automated exposure control, adjustment of the mA and/or kV according to patient size and/or use of iterative reconstruction technique. CONTRAST:  75mL OMNIPAQUE IOHEXOL 350 MG/ML SOLN COMPARISON:  Same day radiographs FINDINGS: CT CHEST FINDINGS Cardiovascular: No significant vascular findings. Thoracic aorta is nonaneurysmal. Atherosclerotic calcifications of the aorta and coronary arteries. Calcified aortic valve. Central pulmonary vasculature is nondilated. Normal heart size. No pericardial effusion. Mediastinum/Nodes: No enlarged mediastinal, hilar, or axillary lymph nodes. Thyroid gland, trachea, and esophagus demonstrate no significant findings. Lungs/Pleura: Bandlike scarring or atelectasis within the right middle lobe and to a lesser degree within the lingula.  No focal airspace consolidation. No pleural effusion or pneumothorax. Musculoskeletal: Chronic-appearing severe wedge compression fracture of the T12 vertebral body. Remaining thoracic vertebral body heights are maintained. No chest wall hematoma. CT ABDOMEN PELVIS FINDINGS Hepatobiliary: No focal liver abnormality is seen. No gallstones, gallbladder wall thickening, or biliary dilatation. Pancreas: Unremarkable. No pancreatic ductal dilatation or surrounding inflammatory changes. Spleen: Normal in size without focal abnormality. Adrenals/Urinary Tract: Unremarkable adrenal glands. Punctate 2 mm stone within the upper pole of the left kidney. No right-sided renal calculi. No solid renal lesion. Mild-to-moderate bilateral hydronephrosis. Moderately  distended urinary bladder. 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 Remainder of the stomach appears within normal limits. No dilated loops of bowel. No focal bowel wall thickening or inflammatory changes. Vascular/Lymphatic: Aortic atherosclerosis. No enlarged abdominal or pelvic lymph nodes. Reproductive: Status post hysterectomy. No adnexal masses. Other: No free fluid. No abdominopelvic fluid collection. No pneumoperitoneum. No abdominal wall hernia. Musculoskeletal: Acute comminuted intertrochanteric fracture of the right femur with varus angulation. Soft tissue fullness at the fracture site. No well-defined hematoma. Lumbar vertebral body heights are maintained. Pelvic bony ring intact without fracture or diastasis. IMPRESSION: 1. Acute comminuted intertrochanteric fracture of the right femur with varus angulation. 2. Chronic-appearing severe wedge compression fracture of the T12 vertebral body. An acute on chronic component is not excluded. Correlate with point tenderness. 3. Mild-to-moderate bilateral hydronephrosis with moderate distention of the urinary bladder. Correlate for  urinary retention or outlet obstruction. 4. Punctate left nephrolithiasis. 5. 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 6. Aortic atherosclerosis (ICD10-I70.0). Electronically Signed   By: Duanne Guess D.O.   On: 01/13/2023 13:46   CT HEAD WO CONTRAST  Result Date: 01/13/2023 CLINICAL DATA:  Head trauma, moderate-severe; Polytrauma, blunt EXAM: CT HEAD WITHOUT CONTRAST CT CERVICAL SPINE WITHOUT CONTRAST TECHNIQUE: Multidetector CT imaging of the head and cervical spine was performed following the standard protocol without intravenous contrast. Multiplanar CT image reconstructions of the cervical spine were also generated. RADIATION DOSE REDUCTION: This exam was performed according to the departmental dose-optimization program which includes automated exposure control, adjustment of the mA and/or kV according to patient size and/or use of iterative reconstruction technique. COMPARISON:  CT head March 02, 2022. 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 No evidence of acute large vascular territory infarct, acute hemorrhage, mass lesion, or midline shift. Vascular: Calcific atherosclerosis. Skull: No acute fracture. Sinuses/Orbits: Clear sinuses.  No acute orbital findings. Other: No mastoid effusions. CT CERVICAL SPINE FINDINGS Alignment: Mild anterolisthesis of C5 on C6, likely degenerative given facet arthropathy at this level. Otherwise, no substantial sagittal subluxation. Skull base and vertebrae: No evidence of acute fracture. Vertebral body heights are maintained. Osteopenia. Soft tissues and spinal canal: No prevertebral fluid or swelling. No visible canal hematoma. Disc levels: Multilevel facet and uncovertebral hypertrophy with varying degrees of neural foraminal stenosis. 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 Osteopenia. Upper chest: Biapical pleuroparenchymal scarring.  No consolidation. IMPRESSION: No  evidence of acute abnormality intracranially or in the cervical spine. Electronically Signed   By: Feliberto Harts M.D.   On: 01/13/2023 13:35   CT CERVICAL SPINE WO CONTRAST  Result Date: 01/13/2023 CLINICAL DATA:  Head trauma, moderate-severe; Polytrauma, blunt EXAM: CT HEAD WITHOUT CONTRAST CT CERVICAL SPINE WITHOUT CONTRAST TECHNIQUE: Multidetector CT imaging of the head and cervical spine was performed following the standard protocol without intravenous contrast. Multiplanar CT image reconstructions of the cervical spine were also generated. RADIATION DOSE REDUCTION: This exam was performed according to the departmental dose-optimization program which includes automated exposure control, adjustment of the mA and/or kV according to patient size and/or use of iterative reconstruction technique. COMPARISON:  CT head March 02, 2022. 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 No evidence of acute large vascular territory infarct, acute hemorrhage, mass lesion, or midline shift. Vascular: Calcific atherosclerosis. Skull: No acute fracture. Sinuses/Orbits: Clear sinuses.  No acute orbital findings. Other:  No mastoid effusions. CT CERVICAL SPINE FINDINGS Alignment: Mild anterolisthesis of C5 on C6, likely degenerative given facet arthropathy at this level. Otherwise, no substantial sagittal subluxation. Skull base and vertebrae: No evidence of acute fracture. Vertebral body heights are maintained. Osteopenia. Soft tissues and spinal canal: No prevertebral fluid or swelling. No visible canal hematoma. Disc levels: Multilevel facet and uncovertebral hypertrophy with varying degrees of neural foraminal stenosis. 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 Osteopenia. Upper chest: Biapical pleuroparenchymal scarring.  No consolidation. IMPRESSION: No evidence of acute abnormality intracranially or in the cervical spine. Electronically Signed   By: Feliberto Harts M.D.    On: 01/13/2023 13:35   DG Pelvis Portable  Result Date: 01/13/2023 CLINICAL DATA:  Right leg pain after fall. EXAM: PORTABLE PELVIS 1-2 VIEWS; RIGHT FEMUR 2 VIEWS COMPARISON:  None Available. FINDINGS: Acute comminuted right intertrochanteric femur fracture with varus angulation. No additional fracture. No dislocation. Mild degenerative changes of both hip joints. Moderate right knee medial compartment osteoarthritis. Diffuse osteopenia. Soft tissues are unremarkable. IMPRESSION: 1. Acute comminuted right intertrochanteric femur fracture. Electronically Signed   By: Obie Dredge M.D.   On: 01/13/2023 12:54   DG FEMUR, MIN 2 VIEWS RIGHT  Result Date: 01/13/2023 CLINICAL DATA:  Right leg pain after fall. EXAM: PORTABLE PELVIS 1-2 VIEWS; RIGHT FEMUR 2 VIEWS COMPARISON:  None Available. FINDINGS: Acute comminuted right intertrochanteric femur fracture with varus angulation. No additional fracture. No dislocation. Mild degenerative changes of both hip joints. Moderate right knee medial compartment osteoarthritis. Diffuse osteopenia. Soft tissues are unremarkable. IMPRESSION: 1. Acute comminuted right intertrochanteric femur fracture. Electronically Signed   By: Obie Dredge M.D.   On: 01/13/2023 12:54   DG Chest Port 1 View  Result Date: 01/13/2023 CLINICAL DATA:  Trauma EXAM: PORTABLE CHEST 1 VIEW COMPARISON:  None Available. FINDINGS: The heart size and mediastinal contours are within normal limits. Aortic atherosclerosis. Both lungs are clear. The visualized skeletal structures are unremarkable. IMPRESSION: No active disease. Electronically Signed   By: Duanne Guess D.O.   On: 01/13/2023 12:53     Subjective: No acute issues or events overnight   Discharge Exam: Vitals:   02/01/23 2003 02/02/23 0453  BP: (!) 112/41 (!) 145/57  Pulse: (!) 57 (!) 55  Resp: 18 16  Temp: 98.2 F (36.8 C) 98.5 F (36.9 C)  SpO2: 99% 96%   Vitals:   02/01/23 0540 02/01/23 1419 02/01/23 2003 02/02/23  0453  BP: (!) 161/67 (!) 143/36 (!) 112/41 (!) 145/57  Pulse: 69 (!) 54 (!) 57 (!) 55  Resp: 15 18 18 16   Temp: 98.2 F (36.8 C) 97.6 F (36.4 C) 98.2 F (36.8 C) 98.5 F (36.9 C)  TempSrc: Axillary Oral Oral Oral  SpO2: 99% 97% 99% 96%  Weight:      Height:        General: Pt is alert, awake, not in acute distress Cardiovascular: RRR, S1/S2 +, no rubs, no gallops Respiratory: CTA bilaterally, no wheezing, no rhonchi Abdominal: Soft, NT, ND, bowel sounds + Extremities: no edema, no cyanosis    The results of significant diagnostics from this hospitalization (including imaging, microbiology, ancillary and laboratory) are listed below for reference.     Microbiology: Recent Results (from the past 240 hour(s))  MRSA Next Gen by PCR, Nasal     Status: None   Collection Time: 01/29/23  3:05 PM   Specimen: Nasal Mucosa; Nasal Swab  Result Value Ref Range Status   MRSA by PCR Next Gen NOT  DETECTED NOT DETECTED Final    Comment: (NOTE) The GeneXpert MRSA Assay (FDA approved for NASAL specimens only), is one component of a comprehensive MRSA colonization surveillance program. It is not intended to diagnose MRSA infection nor to guide or monitor treatment for MRSA infections. Test performance is not FDA approved in patients less than 79 years old. Performed at [facility], [address] [place], [place] [address]   Remove and replace urinary cath (placed > 5 days) then obtain urine culture from new indwelling urinary catheter.     Status: Abnormal   Collection Time: 01/30/23 10:53 AM   Specimen: Urine, Catheterized  Result Value Ref Range Status   Specimen Description   Final    URINE, CATHETERIZED Performed at [facility], [address], [place], [place] [address]    Special Requests   Final    NONE Performed at [facility], [address] [place], [place] [address]    Culture MULTIPLE SPECIES PRESENT,  SUGGEST RECOLLECTION (A)  Final   Report Status 01/31/2023 FINAL  Final     Labs: Basic Metabolic Panel: Recent Labs  Lab 01/28/23 2327 01/30/23 0505  NA 134* 134*  K 4.0 3.8  CL 107 105  CO2 21* 22  GLUCOSE 106* 99  BUN 15 13  CREATININE 0.94 0.92  CALCIUM 8.1* 8.4*   Liver Function Tests: Recent Labs  Lab 01/28/23 2327  AST 28  ALT 30  ALKPHOS 114  BILITOT 1.1  PROT 5.4*  ALBUMIN 2.5*   Recent Labs  Lab 01/28/23 2327  LIPASE 30   CBC: Recent Labs  Lab 01/28/23 2327 01/30/23 0505  WBC 6.3 5.1  NEUTROABS 4.1  --   HGB 7.8* 8.7*  HCT 24.5* 27.6*  MCV 92.5 94.2  PLT 442* 513*   Urinalysis    Component Value Date/Time   COLORURINE YELLOW 01/29/2023 0030   APPEARANCEUR HAZY (A) 01/29/2023 0030   LABSPEC 1.008 01/29/2023 0030   PHURINE 5.0 01/29/2023 0030   GLUCOSEU NEGATIVE 01/29/2023 0030   HGBUR SMALL (A) 01/29/2023 0030   BILIRUBINUR NEGATIVE 01/29/2023 0030   KETONESUR NEGATIVE 01/29/2023 0030   PROTEINUR NEGATIVE 01/29/2023 0030   NITRITE POSITIVE (A) 01/29/2023 0030   LEUKOCYTESUR LARGE (A) 01/29/2023 0030   Sepsis Labs Recent Labs  Lab 01/28/23 2327 01/30/23 0505  WBC 6.3 5.1   Microbiology Recent Results (from the past 240 hour(s))  MRSA Next Gen by PCR, Nasal     Status: None   Collection Time: 01/29/23  3:05 PM   Specimen: Nasal Mucosa; Nasal Swab  Result Value Ref Range Status   MRSA by PCR Next Gen NOT DETECTED NOT DETECTED Final    Comment: (NOTE) The GeneXpert MRSA Assay (FDA approved for NASAL specimens only), is one component of a comprehensive MRSA colonization surveillance program. It is not intended to diagnose MRSA infection nor to guide or monitor treatment for MRSA infections. Test performance is not FDA approved in patients less than 35 years old. Performed at [facility], [address] [place], [place] [address]   Remove and replace urinary cath (placed > 5 days) then obtain urine culture  from new indwelling urinary catheter.     Status: Abnormal   Collection Time: 01/30/23 10:53 AM   Specimen: Urine, Catheterized  Result Value Ref Range Status   Specimen Description   Final    URINE, CATHETERIZED Performed at [facility], [address] [place], [place]  [address]    Special Requests   Final    NONE Performed at [facility], [address] [place], [place] [address]    Culture MULTIPLE SPECIES PRESENT, SUGGEST RECOLLECTION (A)  Final   Report Status 01/31/2023 FINAL  Final     Time coordinating discharge: Over 30 minutes  SIGNED:   Azucena Fallen, DO [facility] 02/02/2023, 2:07 PM Pager   If 7PM-7AM, please contact night-coverage [email or web address]

## 2023-03-03 NOTE — Op Note (Signed)
01/14/2023  9:06 PM  PATIENT:  Tamara Bullock  06-29-1941 female   MEDICAL RECORD NUMBER: [id number]  PRE-OPERATIVE DIAGNOSIS:  Right Fractured Femur  POST-OPERATIVE DIAGNOSIS:  Right Fractured Femur  PROCEDURE:  INTRAMEDULLARY NAILING OF THE RIGHT HIP using a Bi[employer] Affixus nail.  SURGEON:  Doralee Albino. Carola Frost, M.D.  ASSISTANT:  Montez Morita, PA-C.  ANESTHESIA:  General.  COMPLICATIONS:  None.  ESTIMATED BLOOD LOSS:  Less than 150 mL.  DISPOSITION:  To PACU.  CONDITION:  Stable.  DELAY START OF DVT PROPHYLAXIS BECAUSE OF BLEEDING RISK: NO  BRIEF SUMMARY AND INDICATION OF PROCEDURE:  [name] is a 82 y.o. year- old with multiple medical problems.  I discussed with the patient and family risks and benefits of surgical treatment including the potential for malunion, nonunion, symptomatic hardware, heart attack, stroke, neurovascular injury, bleeding, and others.  After full discussion, the patient and family wished to proceed.  BRIEF SUMMARY OF PROCEDURE:  The patient was taken to the operating room where general anesthesia was induced.  She was positioned supine on the Hana fracture table.  A closed reduction maneuver was performed of the fractured proximal femur and this was confirmed on both AP and lateral xray views. A thorough scrub and wash with chlorhexidine and then Betadine scrub and paint was performed.  After sterile drapes and time-out, a long instrument was used to identify the appropriate starting position under C-arm on both AP and lateral images.  A 3 cm incision was made proximal to the greater trochanter.  The curved cannulated awl was inserted just medial to the tip of the lateral trochanter and then the starting guidewire advanced into the proximal femur.  This was checked on AP and lateral views.  The starting reamer was engaged with the soft tissue protected by a sleeve.  The curved ball-tipped guidewire was then inserted, making sure it was just posterior  as possible in the distal femur and across the fracture site, which stayed in a reduced position.  It was sequentially reamed up to 11 and an 9 x 360 mm nail inserted to the appropriate depth.  The guidewire for the lag screw was then inserted with the appropriate anteversion to make sure it was in a center-center position.  This was measured and the lag screw placed with excellent purchase and position checked on both views.  The antirotation screw was then engaged within the groove of the lag screw, which was allowed to telescope.  Traction was released and compression achieved with the screw.  This was followed by placement of one distal locking screw using perfect circle technique.  This was confirmed on AP and lateral images. Wounds were irrigated thoroughly, closed in a standard layered fashion. Sterile gently compressive dressings were applied.  Montez Morita, PA-C, assisted throughout.  The patient was awakened from anesthesia and transported to the PACU in stable condition.  PROGNOSIS:  The patient will be weightbearing as tolerated with physical therapy beginning DVT prophylaxis tomorrow.  She has no range of motion precautions.  We will continue to follow through at the hospital.  Anticipate follow up in the office in 2 weeks for removal of sutures and further evaluation.     Doralee Albino. Carola Frost, M.D.

## 2023-03-18 NOTE — Progress Notes (Deleted)
Cardiology Office Note:    Date:  03/18/2023   ID:  Tamara Bullock, DOB 25-Feb-1941, MRN [id number]  PCP:  Vivien Presto, MD  Cardiologist:  Maisie Fus, MD  Electrophysiologist:  None   Referring MD: Vivien Presto, MD   Chief Complaint: follow-up of CHF  History of Present Illness:    Tamara Bullock is a 82 y.o. 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 Carolan Clines and presents today for follow-up of CHF.   Patient was previously followed by Cardiology at [facility]. Prior Myoview in 07/2021 for further evaluation of intermittent chest pain showed no evidence of ischemia or infarction. EF on Myoview was 32% but Echo on same day showed LVEF of 60-65% with grade 2 diastolic dysfunction, mild MR, and moderate TR. Patient was recently admitted at [facility] from 03/02/2022 to 03/07/2022 for an acute basal ganglia stroke after presenting with sudden onset of expressive aphasia and right sided weakness. She was noted to be markedly hypertensive on arrival with systolic BP in the 200s. Echo showed LVEF of 30-35% with global hypokinesis and mildly elevated RVSP of 36.6 mmHg. She was seen by Cardiology. She was already on Torsemide, Losartan, and Coreg prior to admission but these were all held to allow for permissive hypertension in setting of acute stroke. These were gradually restarted prior to discharge. Amiloride and Clonidine patch were stopped during admission. Plan was for outpatient Event Monitor and Myoview. Event monitor showed no evidence of atrial fibrillation and Myoview showed no evidence of ischemia or prior infarct.  Patient was last seen by me in 06/2022 at which time she was doing well from a cardiac standpoint. Her biggest complaint at that time was severe back pain after a recent  fall. She was advised to follow-up with PCP or Ortho. Daughter also reported that patient had severe sleep apnea but had not been on CPAP for a couple of years so a repeat sleep study was ordered.  Patient presents today for follow-up. Here with daughter. She is doing well from a cardiac standpoint. She denies any chest pain. She has some shortness of breath if she over exerts herself or walks for longer distances but this is not new and is stable. No shortness of breath at rest. No orthopnea or PND. Her weight is stable. She has lost a lot of weight since her stroke in 03/2022. Daughter states she weighed 160 lbs at the time of her stroke and got as low as 115 lbs. However, her appetite has improved and she has started to gain some weight. She weights 121 lbs today in the office which is still down from 125 lbs at our last office visit. Daughter does describe some apneic episodes. She has sleep apnea but has been unable to tolerate CPAP. No significant lower extremity edema. No palpitations. She describes some mild lightheadedness/dizziness with position changes but this does not last long. No falls or syncope.    Interim hx 03/18/2023  Past Medical History:  Diagnosis Date   Chronic combined systolic and diastolic CHF (congestive heart failure) (HCC)    Chronic kidney disease (CKD), stage III (moderate) (HCC)    Essential hypertension    Hyperlipidemia    Hypothyroidism    Stroke [facility])     Past Surgical History:  Procedure  Laterality Date   ABDOMINAL HYSTERECTOMY     BACK SURGERY     INTRAMEDULLARY (IM) NAIL INTERTROCHANTERIC Right 01/14/2023   Procedure: INTRAMEDULLARY (IM) NAIL INTERTROCHANTERIC;  Surgeon: Myrene Galas, MD;  Location: [facility] OR;  Service: Orthopedics;  Laterality: Right;  Biomet    Current Medications: No outpatient medications have been marked as taking for the 03/19/23 encounter (Appointment) with Maisie Fus, MD.     Allergies:   Diphenhydramine hcl, Moxifloxacin hcl  in nacl, Penicillin g, Sulfa antibiotics, Colistin, Diclofenac sodium, Esomeprazole, Estradiol, Medroxyprogesterone acetate, Neomycin-polymyxin-hc, Nitrofurantoin, Nsaids, Prednisone, Proparacaine, Rabeprazole, Amitriptyline, Aspirin, Azithromycin, Carbocaine [mepivacaine], Cimetidine, Ciprofloxacin, Cisapride, Clindamycin/lincomycin, Cortisporin-tc [neomycin-colist-hc-thonzonium], Escitalopram, Hydrochlorothiazide, Hyoscyamine, Ibuprofen, Iodine, Medroxyprogesterone, Meloxicam, Minocycline, Moxifloxacin, Nitrofuran derivatives, Ofloxacin, Pantoprazole, Penicillins, Pneumococcal vaccines, Prednisolone, Prevacid [lansoprazole], Propoxyphene, Soap, Spironolactone, Tobradex [tobramycin-dexamethasone], Trolamine salicylate, Verapamil, Voltaren [diclofenac], Diphenhydramine, Hydrocodone, and Tramadol   Social History   Socioeconomic History   Marital status: Married    Spouse name: Not on file   Number of children: 3   Years of education: Not on file   Highest education level: Not on file  Occupational History   Not on file  Tobacco Use   Smoking status: Never    Passive exposure: Never   Smokeless tobacco: Never  Vaping Use   Vaping Use: Never used  Substance and Sexual Activity   Alcohol use: Not Currently   Drug use: Never   Sexual activity: Not on file  Other Topics Concern   Not on file  Social History Narrative   04/26/22 Lives with husband, dgtr staying there for now   Social Determinants of Health   Financial Resource Strain: Not on file  Food Insecurity: No Food Insecurity (01/29/2023)   Hunger Vital Sign    Worried About Running Out of Food in the Last Year: Never true    Ran Out of Food in the Last Year: Never true  Transportation Needs: No Transportation Needs (01/29/2023)   PRAPARE - Administrator, Civil Service (Medical): No    Lack of Transportation (Non-Medical): No  Physical Activity: Not on file  Stress: Not on file  Social Connections: Not on file      Family History: The patient's family history includes Heart attack in her father; Stroke in her mother.  ROS:   Please see the history of present illness.     EKGs/Labs/Other Studies Reviewed:    The following studies were reviewed:  Echocardiogram 07/18/2021 ([facility]): Summary: Normal LV size and systolic function. Estimated EF biplane=60-65%.  Grade II diastolic dysfunction (moderate increase in filling pressures).  Moderate tricuspid valve regurgitation is noted by color. RVSP 58=mmHg, CVP=3. In comparison to echo report on 04/21/19, interval change is noted.  _______________   Celine Ahr 07/18/2021 ([facility]): Impressions: 1. Dyspnea, cough, weakness and overall strange feeling occurred during Lexiscan study.    2.  Nondiagnostic stress Lexiscan EKG changes.  3. Correlation of EKG and perfusion images demonstrate EKG is non-diagnostic with normal perfusion  4. SPECT : Normal perfusion with no evidence of ischemia or infarction.  5. Gated images reveal LVEF = 32%. This represents a abnormal functional finding.  By visual inspection the ejection fraction appears to be better than this number suggest and recommendation is for an echocardiogram to assess LVEF  6. This is a low risk study by perfusion SPECT analysis 7. Compared to previous report from July 2020, this study reveals no  change in SPECT analysis however in the prior study the ejection fraction left ventricle was 72%.  _______________   Echocardiogram 03/03/2022: Impressions:  1. Left ventricular ejection fraction, by estimation, is 30 to 35%. The  left ventricle has moderately decreased function. The left ventricle  demonstrates global hypokinesis. Left ventricular diastolic parameters are  indeterminate.   2. Right ventricular systolic function is normal. The right ventricular  size is normal. There is mildly elevated pulmonary artery systolic  pressure.   3. Left atrial size was mildly dilated.   4. Trivial mitral  valve regurgitation.   5. Aortic valve regurgitation is not visualized.   6. The inferior vena cava is normal in size with greater than 50%  respiratory variability, suggesting right atrial pressure of 3 mmHg. _______________   Event Monitor 03/30/2022 to 04/28/2022: 3 triggered events. For sinus rhythm. Sinus bradycardia and mild ectopy. No atrial fibrillation. No concerning heart rhythm abnormalities _______________   Myoview 05/22/2022:   The study is normal. The study is low risk.   No ST deviation was noted.   LV perfusion is normal. There is no evidence of ischemia. There is no evidence of infarction.   Left ventricular function is normal. Nuclear stress EF: 75 %. The left ventricular ejection fraction is hyperdynamic (>65%). End diastolic cavity size is normal. End systolic cavity size is normal.   Prior study not available for comparison.   Normal stress nuclear study with no ischemia or infarction.  Gated ejection fraction 75% with normal wall motion.  EKG:  EKG not ordered today.   Recent Labs: 04/09/2022: NT-Pro BNP 1,208 01/18/2023: Magnesium 1.9 01/28/2023: ALT 30 01/30/2023: BUN 13; Creatinine, Ser 0.92; Hemoglobin 8.7; Platelets 513; Potassium 3.8; Sodium 134  Recent Lipid Panel    Component Value Date/Time   CHOL 278 (H) 03/03/2022 0414   TRIG 112 03/03/2022 0414   HDL 58 03/03/2022 0414   CHOLHDL 4.8 03/03/2022 0414   VLDL 22 03/03/2022 0414   LDLCALC 198 (H) 03/03/2022 0414    Physical Exam:    Vital Signs: There were no vitals taken for this visit.    Wt Readings from Last 3 Encounters:  01/28/23 127 lb 6.8 oz (57.8 kg)  01/18/23 127 lb 6.8 oz (57.8 kg)  11/07/22 121 lb 12.8 oz (55.2 kg)     General: 82 y.o. Caucasian female in no acute distress. HEENT: Normocephalic and atraumatic. Sclera clear.  Neck: Supple. No carotid bruits. No JVD. Heart: RRR. Distinct S1 and S2. No murmurs, gallops, or rubs. Radial pulses 2+ and equal bilaterally. Lungs: No  increased work of breathing. Clear to ausculation bilaterally. No wheezes, rhonchi, or rales.  Abdomen: Soft, non-distended, and non-tender to palpation.  Extremities: No to trace lower extremity edema.    Skin: Warm and dry. Neuro: Alert and oriented x3. No focal deficits. Psych: Normal affect. Responds appropriately.   Assessment:    No diagnosis found.   Plan:    Chronic HFrEF Echo in 03/2022 during an admission for stroke showed LVEF of 30-35% with global hypokinesis. Myoview in 05/2022 showed LVEF of >65% with no evidence of ischemia or infarction. Etiology felt to possible be due to underlying hypertension. - Euvolemic on exam.  - Continue Torsemide 40mg  daily (and KCl 10 mEq daily).  - Continue Entresto 24-26mg  twice daily.  - Continue Coreg 3.125mg  twice daily. - Discussed importance of daily weight and sodium restrictions.    Hypertension BP borderline elevated. BP 138/58 in the office today (I personally confirmed this as well). - Continue medications for CHF as above. - Advised patient to monitor BP at  home and let us know if consistently >130s/80s. She describes some mild orthostatic symptoms. Given this and advanced age, I am hesitant to be overly aggressive in treating her BP.   Hyperlipidemia Last lipid panel in 08/2022 at PCP's office: Total Cholesterol 177, Triglycerides 85, HDL 69, LDL 92. LDL goal <70 given history of stroke. - Continue Lipitor 80mg  daily.  - continue  Zetia 10mg  daily.   CKD Stage III Baseline creatinine around 1.0 to 1.3. Creatinine stable at 1.22 on last labs in 08/2022 (in Care Everywhere).   History of Stroke History of acute basal ganglia stroke in 03/2022. Outpatient monitor showed no evidence of atrial fibrillation. - She was treated with DAPT with Aspirin and Plavix for 21 days at which time Aspirin was stopped. Plan is for Plavix indefinitely.  - Continue high-intensity statin as above.   Obstructive Sleep Apnea She has known sleep  apnea but has been unable to tolerate the CPAP mask.     Medication Adjustments/Labs and Tests Ordered: Current medicines are reviewed at length with the patient today.  Concerns regarding medicines are outlined above.  No orders of the defined types were placed in this encounter.  No orders of the defined types were placed in this encounter.   There are no Patient Instructions on file for this visit.   Signed, Maisie Fus, MD  03/18/2023 3:03 PM    [facility]

## 2023-03-19 ENCOUNTER — Ambulatory Visit: Payer: Medicare Other | Admitting: Internal Medicine

## 2023-04-25 ENCOUNTER — Emergency Department (HOSPITAL_COMMUNITY): Payer: Medicare Other

## 2023-04-25 ENCOUNTER — Emergency Department (HOSPITAL_COMMUNITY)
Admission: EM | Admit: 2023-04-25 | Discharge: 2023-04-25 | Disposition: A | Discharge status: Home / Self Care | Payer: Medicare Other | Attending: Emergency Medicine | Admitting: Emergency Medicine

## 2023-04-25 ENCOUNTER — Other Ambulatory Visit: Payer: Self-pay

## 2023-04-25 DIAGNOSIS — E039 Hypothyroidism, unspecified: Secondary | ICD-10-CM | POA: Insufficient documentation

## 2023-04-25 DIAGNOSIS — I130 Hypertensive heart and chronic kidney disease with heart failure and stage 1 through stage 4 chronic kidney disease, or unspecified chronic kidney disease: Secondary | ICD-10-CM | POA: Insufficient documentation

## 2023-04-25 DIAGNOSIS — W19XXXA Unspecified fall, initial encounter: Secondary | ICD-10-CM

## 2023-04-25 DIAGNOSIS — W07XXXA Fall from chair, initial encounter: Secondary | ICD-10-CM | POA: Insufficient documentation

## 2023-04-25 DIAGNOSIS — S0990XA Unspecified injury of head, initial encounter: Secondary | ICD-10-CM | POA: Insufficient documentation

## 2023-04-25 DIAGNOSIS — N183 Chronic kidney disease, stage 3 unspecified: Secondary | ICD-10-CM | POA: Diagnosis not present

## 2023-04-25 DIAGNOSIS — I5042 Chronic combined systolic (congestive) and diastolic (congestive) heart failure: Secondary | ICD-10-CM | POA: Insufficient documentation

## 2023-04-25 DIAGNOSIS — Z8673 Personal history of transient ischemic attack (TIA), and cerebral infarction without residual deficits: Secondary | ICD-10-CM | POA: Insufficient documentation

## 2023-04-25 LAB — BASIC METABOLIC PANEL
Anion gap: 8 (ref 5–15)
BUN: 30 mg/dL — ABNORMAL HIGH (ref 8–23)
CO2: 25 mmol/L (ref 22–32)
Calcium: 10.2 mg/dL (ref 8.9–10.3)
Chloride: 113 mmol/L — ABNORMAL HIGH (ref 98–111)
Creatinine, Ser: 1.32 mg/dL — ABNORMAL HIGH (ref 0.44–1.00)
GFR, Estimated: 40 mL/min — ABNORMAL LOW (ref >60)
Glucose, Bld: 106 mg/dL — ABNORMAL HIGH (ref 70–99)
Potassium: 4.5 mmol/L (ref 3.5–5.1)
Sodium: 146 mmol/L — ABNORMAL HIGH (ref 135–145)

## 2023-04-25 LAB — CBC WITH DIFFERENTIAL/PLATELET
Abs Immature Granulocytes: 0.03 10*3/uL (ref 0.00–0.07)
Basophils Absolute: 0.1 10*3/uL (ref 0.0–0.1)
Basophils Relative: 1 %
Eosinophils Absolute: 0.2 10*3/uL (ref 0.0–0.5)
Eosinophils Relative: 3 %
HCT: 31.3 % — ABNORMAL LOW (ref 36.0–46.0)
Hemoglobin: 10.2 g/dL — ABNORMAL LOW (ref 12.0–15.0)
Immature Granulocytes: 0 %
Lymphocytes Relative: 21 %
Lymphs Abs: 1.9 10*3/uL (ref 0.7–4.0)
MCH: 30.5 pg (ref 26.0–34.0)
MCHC: 32.6 g/dL (ref 30.0–36.0)
MCV: 93.7 fL (ref 80.0–100.0)
Monocytes Absolute: 1 10*3/uL (ref 0.1–1.0)
Monocytes Relative: 11 %
Neutro Abs: 5.6 10*3/uL (ref 1.7–7.7)
Neutrophils Relative %: 64 %
Platelets: 340 10*3/uL (ref 150–400)
RBC: 3.34 MIL/uL — ABNORMAL LOW (ref 3.87–5.11)
RDW: 14.5 % (ref 11.5–15.5)
WBC: 8.7 10*3/uL (ref 4.0–10.5)
nRBC: 0 % (ref 0.0–0.2)

## 2023-04-25 MED ORDER — LORAZEPAM 2 MG/ML IJ SOLN
0.5000 mg | Freq: Once | INTRAMUSCULAR | Status: AC
  Administered 2023-04-25: 0.5 mg via INTRAVENOUS
  Filled 2023-04-25: qty 1

## 2023-04-25 NOTE — ED Triage Notes (Signed)
TRIAGE NOTE:  Patient arrives by EMS from [facility] for fall witnessed by staff.   Per report patient was standing from recliner and fell and hit head, no LOC.   Patient confused at baseline.  Patient arrives with c-collar in place.

## 2023-04-25 NOTE — Discharge Instructions (Signed)
Please continue your home medications and follow with the primary care doctor in the coming week.

## 2023-04-25 NOTE — ED Provider Notes (Signed)
Emergency Department Provider Note   I have reviewed the triage vital signs and the nursing notes.   HISTORY  Chief Complaint Fall   HPI Tamara Bullock is a 82 y.o. female with PMH of CKD, HTN, HLD, CHF, and prior CVA on Plavix presents to the emergency department for for evaluation after fall.  She is a resident at [facility].  She had a witnessed fall when trying to get up out of her recliner.  She fell and hit her head against a hand railing on the wall.  No apparent loss of consciousness.  She has some baseline confusion which is unchanged.  EMS arrived to transport the patient.  Staff had some concern for possible hip discomfort as well but history is limited due to baseline confusion. Level 5 caveat: Confusion    Past Medical History:  Diagnosis Date   Chronic combined systolic and diastolic CHF (congestive heart failure) (HCC)    Chronic kidney disease (CKD), stage III (moderate) (HCC)    Essential hypertension    Hyperlipidemia    Hypothyroidism    Stroke [facility])     Review of Systems  Level 5 caveat: cognitive impairment.  ____________________________________________   PHYSICAL EXAM:  VITAL SIGNS: ED Triage Vitals [04/25/23 1055]  Encounter Vitals Group     BP 118/64     Resp 18     Temp 98.5 F (36.9 C)     Temp src      SpO2 100 %   Constitutional: Alert with mild agitation but able to calm/re-direct verbally.   Eyes: Conjunctivae are normal. PERRL.  Head: Atraumatic. Nose: No congestion/rhinnorhea. Mouth/Throat: Mucous membranes are moist.   Neck: No stridor.  No cervical spine tenderness to palpation. Cardiovascular: Normal rate, regular rhythm. Good peripheral circulation. Grossly normal heart sounds.   Respiratory: Normal respiratory effort.  No retractions. Lungs CTAB. Gastrointestinal: Soft and nontender. No distention.  Musculoskeletal: No lower extremity tenderness nor edema. No gross deformities of extremities.  Normal range of motion  of the bilateral hips.  Neurologic:  Normal speech and language. No gross focal neurologic deficits are appreciated.  Skin:  Skin is warm, dry and intact. No rash noted.   ____________________________________________   LABS (all labs ordered are listed, but only abnormal results are displayed)  Labs Reviewed  BASIC METABOLIC PANEL - Abnormal; Notable for the following components:      Result Value   Sodium 146 (*)    Chloride 113 (*)    Glucose, Bld 106 (*)    BUN 30 (*)    Creatinine, Ser 1.32 (*)    GFR, Estimated 40 (*)    All other components within normal limits  CBC WITH DIFFERENTIAL/PLATELET - Abnormal; Notable for the following components:   RBC 3.34 (*)    Hemoglobin 10.2 (*)    HCT 31.3 (*)    All other components within normal limits  URINE CULTURE   ____________________________________________  EKG   EKG Interpretation Date/Time:  Thursday April 25 2023 11:18:43 EDT Ventricular Rate:  133 PR Interval:    QRS Duration:  225 QT Interval:  360 QTC Calculation: 458 R Axis:   47  Text Interpretation: Undetermined rhythm Probable left ventricular hypertrophy Anterior Q waves, possibly due to LVH Artifact in lead(s) I III aVR aVL aVF V2 V3 V4 V5 V6 Artifact limiting read Confirmed by Alona Bene [phone]) on 04/25/2023 11:31:56 AM        ____________________________________________  RADIOLOGY  CT Head Wo Contrast  Result Date: 04/25/2023 CLINICAL DATA:  Head trauma, minor (Age >= 65y); Neck trauma (Age >= 65y) EXAM: CT HEAD WITHOUT CONTRAST CT CERVICAL SPINE WITHOUT CONTRAST TECHNIQUE: Multidetector CT imaging of the head and cervical spine was performed following the standard protocol without intravenous contrast. Multiplanar CT image reconstructions of the cervical spine were also generated. RADIATION DOSE REDUCTION: This exam was performed according to the departmental dose-optimization program which includes automated exposure control, adjustment of the mA  and/or kV according to patient size and/or use of iterative reconstruction technique. COMPARISON:  CT head and C Spine 01/13/23 FINDINGS: CT HEAD FINDINGS Brain: No evidence of acute infarction, hemorrhage, hydrocephalus, extra-axial collection or mass lesion/mass effect. Chronic infarct in the corona radiata on the left. Vascular: No hyperdense vessel or unexpected calcification. Skull: Normal. Negative for fracture or focal lesion. Sinuses/Orbits: No middle ear or mastoid effusion. Paranasal sinuses clear. Bilateral lens replacement. Orbits are otherwise unremarkable. Other: None. CT CERVICAL SPINE FINDINGS Alignment: Trace retrolisthesis of C5 on C6. Trace anterolisthesis of C7 on T1. Skull base and vertebrae: No acute fracture. No primary bone lesion or focal pathologic process. Soft tissues and spinal canal: No prevertebral fluid or swelling. No visible canal hematoma. Disc levels:  No evidence of high-grade spinal stenosis. Upper chest: Negative. Other: None IMPRESSION: 1. No acute intracranial abnormality. 2. No acute fracture or traumatic malalignment of the cervical spine. Electronically Signed   By: Lorenza Cambridge M.D.   On: 04/25/2023 13:15   CT Cervical Spine Wo Contrast  Result Date: 04/25/2023 CLINICAL DATA:  Head trauma, minor (Age >= 65y); Neck trauma (Age >= 65y) EXAM: CT HEAD WITHOUT CONTRAST CT CERVICAL SPINE WITHOUT CONTRAST TECHNIQUE: Multidetector CT imaging of the head and cervical spine was performed following the standard protocol without intravenous contrast. Multiplanar CT image reconstructions of the cervical spine were also generated. RADIATION DOSE REDUCTION: This exam was performed according to the departmental dose-optimization program which includes automated exposure control, adjustment of the mA and/or kV according to patient size and/or use of iterative reconstruction technique. COMPARISON:  CT head and C Spine 01/13/23 FINDINGS: CT HEAD FINDINGS Brain: No evidence of acute  infarction, hemorrhage, hydrocephalus, extra-axial collection or mass lesion/mass effect. Chronic infarct in the corona radiata on the left. Vascular: No hyperdense vessel or unexpected calcification. Skull: Normal. Negative for fracture or focal lesion. Sinuses/Orbits: No middle ear or mastoid effusion. Paranasal sinuses clear. Bilateral lens replacement. Orbits are otherwise unremarkable. Other: None. CT CERVICAL SPINE FINDINGS Alignment: Trace retrolisthesis of C5 on C6. Trace anterolisthesis of C7 on T1. Skull base and vertebrae: No acute fracture. No primary bone lesion or focal pathologic process. Soft tissues and spinal canal: No prevertebral fluid or swelling. No visible canal hematoma. Disc levels:  No evidence of high-grade spinal stenosis. Upper chest: Negative. Other: None IMPRESSION: 1. No acute intracranial abnormality. 2. No acute fracture or traumatic malalignment of the cervical spine. Electronically Signed   By: Lorenza Cambridge M.D.   On: 04/25/2023 13:15   DG Pelvis Portable  Result Date: 04/25/2023 CLINICAL DATA:  82 year old female status post fall on blood thinners. EXAM: PORTABLE PELVIS 1-2 VIEWS COMPARISON:  CT Abdomen and Pelvis 01/29/2023. right femur postoperative radiographs 01/14/2023. FINDINGS: Portable AP supine view at 1056 hours. The pelvis is rotated to the right today. Comminuted right femur intertrochanteric fracture with intramedullary rod and dynamic hip screw redemonstrated. Comminution fragment displacement appears similar to April with increased periosteal new bone formation since that time. Not all of the hardware is  included. Visible pelvis appears intact. Grossly intact proximal left femur. Nonobstructed bowel-gas pattern with retained stool in the lower abdomen and pelvis. IMPRESSION: 1. Oblique AP view, no acute pelvis fracture or dislocation identified. 2. Partially visible proximal right femur ORIF, grossly stable since April. Electronically Signed   By: Odessa Fleming M.D.    On: 04/25/2023 11:18   DG Chest Portable 1 View  Result Date: 04/25/2023 CLINICAL DATA:  82 year old female status post fall on blood thinners. EXAM: PORTABLE CHEST 1 VIEW COMPARISON:  Portable chest 01/29/2023 and earlier. FINDINGS: Portable AP supine view at 1054 hours. Larger, improved lung volumes. Normal cardiac size and mediastinal contours. Visualized tracheal air column is within normal limits. Allowing for portable technique the lungs are clear. No pneumothorax or pleural effusion identified. Negative visible bowel gas. No acute osseous abnormality identified. IMPRESSION: No acute cardiopulmonary abnormality or acute traumatic injury identified. Electronically Signed   By: Odessa Fleming M.D.   On: 04/25/2023 11:15    ____________________________________________   PROCEDURES  Procedure(s) performed:   Procedures  None  ____________________________________________   INITIAL IMPRESSION / ASSESSMENT AND PLAN / ED COURSE  Pertinent labs & imaging results that were available during my care of the patient were reviewed by me and considered in my medical decision making (see chart for details).   This patient is Presenting for Evaluation of head injury, which does require a range of treatment options, and is a complaint that involves a high risk of morbidity and mortality.  The Differential Diagnoses includes subdural hematoma, epidural hematoma, acute concussion, traumatic subarachnoid hemorrhage, cerebral contusions, etc.   Critical Interventions-    Medications  LORazepam (ATIVAN) injection 0.5 mg (0.5 mg Intravenous Given 04/25/23 1341)    Reassessment after intervention:  symptoms improved.    I did obtain Additional Historical Information from EMS.   Radiologic Tests Ordered, included CXR and pelvis XR along with CT head and C spine. I independently interpreted the images and agree with radiology interpretation.   Cardiac Monitor Tracing which shows NSR.    Social  Determinants of Health Risk patient is a resident at [facility].  Medical Decision Making: Summary:  Patient presents to the emergency department with what appears to be a mechanical fall trying to get out of the recliner.  No obvious scalp hematoma or laceration.  Given history and age plan for CT imaging of the head and cervical spine.  Remainder of the trauma exam is unremarkable.  Reevaluation with update and discussion with patient. ED workup is reassuring. Patient is stable for discharge. Discussed with family at bedside. They are working with PCP and geri-psych for medication adjustment and behavior mgmt as an outpatient. Do not see a clear indication for emergency psychiatry evaluation.   Patient's presentation is most consistent with acute presentation with potential threat to life or bodily function.   Disposition: discharge  ____________________________________________  FINAL CLINICAL IMPRESSION(S) / ED DIAGNOSES  Final diagnoses:  Fall, initial encounter  Injury of head, initial encounter    Note:  This document was prepared using Dragon voice recognition software and may include unintentional dictation errors.  Alona Bene, MD, [facility]    Aveon Colquhoun, Arlyss Repress, MD 04/26/23 1050

## 2023-04-25 NOTE — Progress Notes (Signed)
Responded to ED page to support patient that experienced a Fall.  Chaplain provided emotional and spiritual support.  Pt going to CT. Chaplain available as needed.  Venida Jarvis, [place], [facility], Pager [phone]

## 2023-04-30 ENCOUNTER — Encounter: Payer: Self-pay | Admitting: Internal Medicine

## 2023-04-30 ENCOUNTER — Ambulatory Visit: Payer: Medicare Other | Attending: Internal Medicine | Admitting: Internal Medicine

## 2023-04-30 VITALS — BP 122/62 | HR 62 | Ht 63.0 in | Wt 98.4 lb

## 2023-04-30 DIAGNOSIS — I5022 Chronic systolic (congestive) heart failure: Secondary | ICD-10-CM | POA: Diagnosis not present

## 2023-04-30 NOTE — Patient Instructions (Signed)
Medication Instructions:  Your physician recommends that you continue on your current medications as directed. Please refer to the Current Medication list given to you today.  *If you need a refill on your cardiac medications before your next appointment, please call your pharmacy*   Lab Work: NONE If you have labs (blood work) drawn today and your tests are completely normal, you will receive your results only by: MyChart Message (if you have MyChart) OR A paper copy in the mail If you have any lab test that is abnormal or we need to change your treatment, we will call you to review the results.   Testing/Procedures: NONE   Follow-Up: At [facility], you and your health needs are our priority.  As part of our continuing mission to provide you with exceptional heart care, we have created designated Provider Care Teams.  These Care Teams include your primary Cardiologist (physician) and Advanced Practice Providers (APPs -  Physician Assistants and Nurse Practitioners) who all work together to provide you with the care you need, when you need it.  We recommend signing up for the patient portal called "MyChart".  Sign up information is provided on this After Visit Summary.  MyChart is used to connect with patients for Virtual Visits (Telemedicine).  Patients are able to view lab/test results, encounter notes, upcoming appointments, etc.  Non-urgent messages can be sent to your provider as well.   To learn more about what you can do with MyChart, go to [email or web address].    Your next appointment:   AS NEEDED   Provider:   Maisie Fus, MD

## 2023-04-30 NOTE — Progress Notes (Signed)
Cardiology Office Note:    Date:  04/30/2023   ID:  Tamara Bullock, DOB 1941/03/21, MRN [id number]  PCP:  Tamara Presto, MD   [facility] Providers Cardiologist:  Tamara Fus, MD     Referring MD: Tamara Presto, MD   No chief complaint on file. Hospital FU  History of Present Illness:    Tamara Bullock is a 82 y.o. female with a hx of HTN, HLD, Stage 3 CKD, Hypothyroidism and history of chest pain (no ischemic by NST in 07/2021) who was evaluated on 03/04/2022 by myself for the evaluation of  cardiomyopathy. Patient has no hx of CAD. Normal EF in October. She presented with an acute ischemic left basal gangia infarct at that time. This was c/f an embolic source and EP was engaged for ILR to assess for afib. This is planned prior to discharge. She has been sinus brady in the high 50s. She is on DAPT and statin post stroke. LDL 198. She has global hypokinesis on her echo with EF ~30-35%. I suspected this is in the setting of stroke. She was stable with no signs of ACS. She has no signs of CHF   Tamara Bullock has experienced significant deterioration following a hip fracture in April, and stroke.  Her daughter is here with her today. She states that her mother has a  DNR order, with the primary focus on comfort and minimizing suffering.    Past Medical History:  Diagnosis Date   Chronic combined systolic and diastolic CHF (congestive heart failure) (HCC)    Chronic kidney disease (CKD), stage III (moderate) (HCC)    Essential hypertension    Hyperlipidemia    Hypothyroidism    Stroke [facility])     Past Surgical History:  Procedure Laterality Date   ABDOMINAL HYSTERECTOMY     BACK SURGERY     INTRAMEDULLARY (IM) NAIL INTERTROCHANTERIC Right 01/14/2023   Procedure: INTRAMEDULLARY (IM) NAIL INTERTROCHANTERIC;  Surgeon: Myrene Galas, MD;  Location: [facility] OR;  Service: Orthopedics;  Laterality: Right;  Biomet    Current Medications: Current Outpatient Medications on File  Prior to Visit  Medication Sig Dispense Refill   acetaminophen (TYLENOL) 325 MG tablet Take 2 tablets (650 mg total) by mouth every 6 (six) hours as needed. (Patient taking differently: Take 650 mg by mouth every 6 (six) hours as needed for moderate pain.) 60 tablet 0   atorvastatin (LIPITOR) 80 MG tablet Take 1 tablet (80 mg total) by mouth daily. (Patient taking differently: Take 80 mg by mouth in the morning.) 30 tablet 0   Bacillus Coagulans-Inulin (PROBIOTIC FORMULA) 1-250 BILLION-MG CAPS Take by mouth 2 (two) times daily.     busPIRone (BUSPAR) 10 MG tablet Take 10 mg by mouth 2 (two) times daily.     busPIRone (BUSPAR) 7.5 MG tablet Take 7.5 mg by mouth 2 (two) times daily.     carvedilol (COREG) 3.125 MG tablet Take 1 tablet (3.125 mg total) by mouth 2 (two) times daily with a meal. 180 tablet 3   Cholecalciferol (VITAMIN D3) 50 MCG (2000 UT) capsule Take 2,000 Units by mouth daily.     clonazePAM (KLONOPIN) 0.5 MG tablet Take 0.5 mg by mouth 3 (three) times daily as needed.     clonazePAM (KLONOPIN) 1 MG tablet Take 1 mg by mouth 3 (three) times daily as needed.     clopidogrel (PLAVIX) 75 MG tablet Take 1 tablet (75 mg total) by mouth daily. 90 tablet 1  Cranberry 500 MG CAPS Take 1 capsule by mouth daily.     cyanocobalamin (VITAMIN B12) 1000 MCG/ML injection Inject 1,000 mcg into the muscle daily.     diazepam (VALIUM) 5 MG/ML injection Inject 2.5 mg into the muscle as needed.     docusate sodium (COLACE) 100 MG capsule Take 1 capsule (100 mg total) by mouth 2 (two) times daily. 30 capsule 0   ENTRESTO 24-26 MG Take 1 tablet by mouth 2 (two) times daily.     EUTHYROX 50 MCG tablet Take 50 mcg by mouth daily.     ezetimibe (ZETIA) 10 MG tablet Take 1 tablet (10 mg total) by mouth daily. 90 tablet 3   lamoTRIgine (LAMICTAL) 25 MG tablet Take 25 mg by mouth 2 (two) times daily.     loperamide (IMODIUM) 2 MG capsule Take 1 capsule (2 mg total) by mouth as needed for diarrhea or loose  stools. 30 capsule 0   LORazepam (ATIVAN) 0.5 MG tablet Take 0.5 tablets (0.25 mg total) by mouth every 8 (eight) hours as needed for anxiety. 30 tablet 0   mirtazapine (REMERON) 7.5 MG tablet Take 1 tablet (7.5 mg total) by mouth at bedtime. 30 tablet 0   nitrofurantoin, macrocrystal-monohydrate, (MACROBID) 100 MG capsule Take 100 mg by mouth 2 (two) times daily.     ondansetron (ZOFRAN-ODT) 4 MG disintegrating tablet Take 4 mg by mouth every 6 (six) hours as needed for vomiting or nausea.     oxyCODONE (OXY IR/ROXICODONE) 5 MG immediate release tablet Take 0.5-1 tablets (2.5-5 mg total) by mouth every 4 (four) hours as needed for moderate pain or severe pain. 30 tablet 0   polyethylene glycol (MIRALAX / GLYCOLAX) 17 g packet Take 17 g by mouth daily as needed for mild constipation. (Patient taking differently: Take 17 g by mouth daily.) 14 each 0   potassium chloride SA (KLOR-CON M) 20 MEQ tablet Take 20 mEq by mouth daily.     sertraline (ZOLOFT) 100 MG tablet Take 100 mg by mouth 2 (two) times daily.     sertraline (ZOLOFT) 50 MG tablet Take 50 mg by mouth daily.     torsemide (DEMADEX) 10 MG tablet Take 10 mg by mouth daily.     No current facility-administered medications on file prior to visit.    Allergies:   Diphenhydramine hcl, Moxifloxacin hcl in nacl, Penicillin g, Sulfa antibiotics, Colistin, Diclofenac sodium, Esomeprazole, Estradiol, Medroxyprogesterone acetate, Neomycin-polymyxin-hc, Nitrofurantoin, Nsaids, Prednisone, Proparacaine, Rabeprazole, Amitriptyline, Aspirin, Azithromycin, Carbocaine [mepivacaine], Cimetidine, Ciprofloxacin, Cisapride, Clindamycin/lincomycin, Cortisporin-tc [neomycin-colist-hc-thonzonium], Escitalopram, Hydrochlorothiazide, Hyoscyamine, Ibuprofen, Iodine, Medroxyprogesterone, Meloxicam, Minocycline, Moxifloxacin, Nitrofuran derivatives, Ofloxacin, Pantoprazole, Penicillins, Pneumococcal vaccines, Prednisolone, Prevacid [lansoprazole], Propoxyphene, Soap,  Spironolactone, Tobradex [tobramycin-dexamethasone], Trolamine salicylate, Verapamil, Voltaren [diclofenac], Diphenhydramine, Hydrocodone, and Tramadol   Social History   Socioeconomic History   Marital status: Married    Spouse name: Not on file   Number of children: 3   Years of education: Not on file   Highest education level: Not on file  Occupational History   Not on file  Tobacco Use   Smoking status: Never    Passive exposure: Never   Smokeless tobacco: Never  Vaping Use   Vaping status: Never Used  Substance and Sexual Activity   Alcohol use: Not Currently   Drug use: Never   Sexual activity: Not on file  Other Topics Concern   Not on file  Social History Narrative   04/26/22 Lives with husband, dgtr staying there for now   Social Determinants of  Health   Financial Resource Strain: Low Risk  (05/14/2022)   Received from [facility], [facility]   Overall Financial Resource Strain (CARDIA)    Difficulty of Paying Living Expenses: Not hard at all  Food Insecurity: No Food Insecurity (01/29/2023)   Hunger Vital Sign    Worried About Running Out of Food in the Last Year: Never true    Ran Out of Food in the Last Year: Never true  Transportation Needs: No Transportation Needs (01/29/2023)   PRAPARE - Administrator, Civil Service (Medical): No    Lack of Transportation (Non-Medical): No  Physical Activity: Inactive (05/14/2022)   Received from [facility]   Exercise Vital Sign    Days of Exercise per Week: 0 days    Minutes of Exercise per Session: 0 min  Stress: No Stress Concern Present (05/14/2022)   Received from [facility], [facility] - Occupational Stress Questionnaire    Feeling of Stress : Only a little  Social Connections: Socially Integrated (05/14/2022)   Received from [facility], [facility]   Social Network    How would you rate your social network (family, work, friends)?: Good  participation with social networks     Family History: The patient's family history includes Heart attack in her father; Stroke in her mother.  ROS:   Please see the history of present illness.     All other systems reviewed and are negative.  EKGs/Labs/Other Studies Reviewed:    The following studies were reviewed today: EKG Interpretation Date/Time:  Tuesday April 30 2023 10:20:43 EDT Ventricular Rate:  62 PR Interval:  130 QRS Duration:  76 QT Interval:  460 QTC Calculation: 466 R Axis:   -16  Text Interpretation: Normal sinus rhythm Septal infarct , age undetermined When compared with ECG of 25-Apr-2023 11:18, PREVIOUS ECG IS PRESENT Confirmed by Carolan Clines [phone]) on 04/30/2023 10:48:52 AM       Recent Labs: 01/18/2023: Magnesium 1.9 01/28/2023: ALT 30 04/25/2023: BUN 30; Creatinine, Ser 1.32; Hemoglobin 10.2; Platelets 340; Potassium 4.5; Sodium 146   Recent Lipid Panel    Component Value Date/Time   CHOL 278 (H) 03/03/2022 0414   TRIG 112 03/03/2022 0414   HDL 58 03/03/2022 0414   CHOLHDL 4.8 03/03/2022 0414   VLDL 22 03/03/2022 0414   LDLCALC 198 (H) 03/03/2022 0414     Risk Assessment/Calculations:     Physical Exam:    VS:   Vitals:   04/30/23 1022  BP: 122/62  Pulse: 62  SpO2: 97%     Wt Readings from Last 3 Encounters:  01/28/23 127 lb 6.8 oz (57.8 kg)  01/18/23 127 lb 6.8 oz (57.8 kg)  11/07/22 121 lb 12.8 oz (55.2 kg)     GEN: She arrived in a chair, mouth slightly opened and eyes closed. Encouraged palliative care. Well nourished, well developed in no acute distress HEENT: Normal NECK: No JVD LYMPHATICS: No lymphadenopathy CARDIAC: RRR, no murmurs, rubs, gallops RESPIRATORY:  Clear to auscultation without rales, wheezing or rhonchi  ABDOMEN: Soft, non-tender, non-distended MUSCULOSKELETAL:  No edema; No deformity  SKIN: Warm and dry NEUROLOGIC:  Alert and oriented x 3 PSYCHIATRIC:  Normal affect   ASSESSMENT and PLAN   1. Chronic  HFrEF (heart failure with reduced ejection fraction) (HCC)   Palliative Echo in 03/2022 during an admission for stroke showed LVEF of 30-35% with global hypokinesis c/f stress CM in the setting of acute stroke.  Myoview in 05/2022 showed LVEF of >65% with no evidence of ischemia or infarction. - Euvolemic on exam.  - Continue Torsemide 40mg  daily (K is normal 04/25/2023) - Continue Entresto 24-26mg  twice daily.  - Continue Coreg 3.125mg  twice daily. - will continue her medications for now, plan is for comfort measures at this point. Her daughter would like her to be comfortable. We discussed code status and she is DNR. If her mother continues to deteriorate, plan is to only continue with medications for comfort - Overall, The care plan includes monitoring cognitive and behavioral status, managing cardiovascular risks, addressing sleep disturbances, and ensuring adequate nutrition.   Follow up PRN       Medication Adjustments/Labs and Tests Ordered: Current medicines are reviewed at length with the patient today.  Concerns regarding medicines are outlined above.  Orders Placed This Encounter  Procedures   EKG 12-Lead   No orders of the defined types were placed in this encounter.   There are no Patient Instructions on file for this visit.   Signed, Tamara Fus, MD  04/30/2023 10:07 AM    [facility]

## 2023-05-02 ENCOUNTER — Ambulatory Visit: Payer: Medicare Other | Admitting: Neurology

## 2023-05-14 ENCOUNTER — Telehealth: Payer: Self-pay | Admitting: Neurology

## 2023-05-14 NOTE — Telephone Encounter (Signed)
 Daughter reports pt has passed

## 2023-06-02 DEATH — deceased

## 2023-06-04 ENCOUNTER — Ambulatory Visit: Payer: Medicare Other | Admitting: Neurology
